# Patient Record
Sex: Female | Born: 1990 | Race: White | Hispanic: No | Marital: Married | Smoking: Never smoker
Health system: Southern US, Community
[De-identification: ages and names within clinical notes are randomized; demographics above are authoritative.]

## PROBLEM LIST (undated history)

## (undated) DIAGNOSIS — R200 Anesthesia of skin: Secondary | ICD-10-CM

## (undated) DIAGNOSIS — G35 Multiple sclerosis: Secondary | ICD-10-CM

## (undated) DIAGNOSIS — N83209 Unspecified ovarian cyst, unspecified side: Secondary | ICD-10-CM

## (undated) DIAGNOSIS — Z789 Other specified health status: Secondary | ICD-10-CM

## (undated) DIAGNOSIS — T7840XA Allergy, unspecified, initial encounter: Secondary | ICD-10-CM

## (undated) DIAGNOSIS — O1490 Unspecified pre-eclampsia, unspecified trimester: Secondary | ICD-10-CM

## (undated) HISTORY — DX: Allergy, unspecified, initial encounter: T78.40XA

## (undated) HISTORY — DX: Anesthesia of skin: R20.0

## (undated) HISTORY — PX: NO PAST SURGERIES: SHX2092

## (undated) HISTORY — PX: WISDOM TOOTH EXTRACTION: SHX21

## (undated) HISTORY — DX: Unspecified ovarian cyst, unspecified side: N83.209

---
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
## 2016-10-19 DIAGNOSIS — O1490 Unspecified pre-eclampsia, unspecified trimester: Secondary | ICD-10-CM

## 2016-10-19 HISTORY — DX: Unspecified pre-eclampsia, unspecified trimester: O14.90

## 2016-10-19 NOTE — L&D Delivery Note (Signed)
Delivery Note At 1731  a viable, healthy and but floppy female "Jessica Mcintosh"  was delivered via  (Presentation:OA ;  ) with shoulder dystocia-resolved after the follow maneuvers: Macroberts, super pubic pressure, attempted to deliver posterior shoulder, then corkscrew.  APGAR:7 ,9 ; weight 7#5oz  .   Placenta status: delivered intact with three vessel  Cord:  with the following complications: .shoulder cord x 1, shoulder dystocia  Anesthesia:  epidural Episiotomy:  none Lacerations:  right labial Suture Repair: 3.0 vicryl rapide Est. Blood Loss (mL):  480  Mom to postpartum.  Baby to Couplet care / Skin to Skin.  [name] 10/08/2017, 6:02 PM

## 2017-03-08 ENCOUNTER — Ambulatory Visit: Payer: Medicaid Other | Admitting: Certified Nurse Midwife

## 2017-03-08 VITALS — BP 113/66 | HR 80 | Ht 61.0 in | Wt 148.2 lb

## 2017-03-08 DIAGNOSIS — Z3401 Encounter for supervision of normal first pregnancy, first trimester: Secondary | ICD-10-CM

## 2017-03-08 NOTE — Progress Notes (Signed)
Lavoris Billow presents for NOB nurse interview visit. Pregnancy confirmation done ___5/14/18___.  G- 1.  P-0 . Pregnancy education material explained and given. __0_ cats in the home. NOB labs ordered. (TSH/HbgA1c due to Increased BMI). HIV labs and Drug screen were explained optional and she did not decline. Drug screen ordered. PNV encouraged. Genetic screening options discussed. Genetic testing: Ordered.  Pt may discuss with provider. Pt. To follow up with provider in _6_ weeks for NOB physical.  All questions answered. Pt has tendency to retain fluid.

## 2017-03-16 NOTE — Progress Notes (Signed)
I have reviewed the record and concur with patient management and plan.    Seidy Labreck Michelle Nima Bamburg, CNM [facility] 

## 2017-03-22 ENCOUNTER — Telehealth: Payer: Self-pay | Admitting: Certified Nurse Midwife

## 2017-03-22 ENCOUNTER — Other Ambulatory Visit: Payer: Self-pay | Admitting: *Deleted

## 2017-03-22 MED ORDER — ONDANSETRON HCL 4 MG PO TABS: 4.0000 mg | ORAL_TABLET | Freq: Three times a day (TID) | ORAL | 0 refills | Status: DC | PRN

## 2017-03-22 NOTE — Telephone Encounter (Signed)
Patient called wanting to get a refill of nausea medication, Patient is down to last pill and does not  have an appointment until 04/16/2017.Patient would like to speak with Marcelino Duster or a nurse  and get a solution to her nausea fairly soon. Please advise.

## 2017-03-22 NOTE — Telephone Encounter (Signed)
Done-ac 

## 2017-03-23 NOTE — Telephone Encounter (Signed)
Patient called again to check on refill - instructed patient to call her pharmacy

## 2017-04-02 ENCOUNTER — Other Ambulatory Visit: Payer: Self-pay | Admitting: Certified Nurse Midwife

## 2017-04-02 DIAGNOSIS — Z369 Encounter for antenatal screening, unspecified: Secondary | ICD-10-CM

## 2017-04-12 ENCOUNTER — Telehealth: Payer: Self-pay | Admitting: Certified Nurse Midwife

## 2017-04-12 MED ORDER — ONDANSETRON HCL 4 MG PO TABS: 4.0000 mg | ORAL_TABLET | Freq: Three times a day (TID) | ORAL | 0 refills | Status: DC | PRN

## 2017-04-12 NOTE — Telephone Encounter (Signed)
Patient needs refill of nausea medication  Pharm [facility] - [address] [place]

## 2017-04-16 ENCOUNTER — Ambulatory Visit (INDEPENDENT_AMBULATORY_CARE_PROVIDER_SITE_OTHER): Payer: Medicaid Other

## 2017-04-16 DIAGNOSIS — Z3401 Encounter for supervision of normal first pregnancy, first trimester: Secondary | ICD-10-CM | POA: Diagnosis not present

## 2017-04-16 DIAGNOSIS — Z369 Encounter for antenatal screening, unspecified: Secondary | ICD-10-CM | POA: Diagnosis not present

## 2017-04-16 LAB — OB RESULTS CONSOLE VARICELLA ZOSTER ANTIBODY, IGG: Varicella: IMMUNE

## 2017-04-17 LAB — CBC WITH DIFFERENTIAL
BASOS ABS: 0 10*3/uL (ref 0.0–0.2)
Basos: 0 %
EOS (ABSOLUTE): 0 10*3/uL (ref 0.0–0.4)
Eos: 0 %
Hematocrit: 41.1 % (ref 34.0–46.6)
Hemoglobin: 13.3 g/dL (ref 11.1–15.9)
IMMATURE GRANS (ABS): 0 10*3/uL (ref 0.0–0.1)
IMMATURE GRANULOCYTES: 0 %
LYMPHS: 12 %
Lymphocytes Absolute: 1.4 10*3/uL (ref 0.7–3.1)
MCH: 29.3 pg (ref 26.6–33.0)
MCHC: 32.4 g/dL (ref 31.5–35.7)
MCV: 91 fL (ref 79–97)
MONOS ABS: 0.7 10*3/uL (ref 0.1–0.9)
Monocytes: 6 %
NEUTROS PCT: 82 %
Neutrophils Absolute: 9.4 10*3/uL — ABNORMAL HIGH (ref 1.4–7.0)
RBC: 4.54 x10E6/uL (ref 3.77–5.28)
RDW: 13.1 % (ref 12.3–15.4)
WBC: 11.6 10*3/uL — AB (ref 3.4–10.8)

## 2017-04-17 LAB — HEPATITIS B SURFACE ANTIGEN: Hepatitis B Surface Ag: NEGATIVE

## 2017-04-17 LAB — RPR: RPR: NONREACTIVE

## 2017-04-17 LAB — HIV ANTIBODY (ROUTINE TESTING W REFLEX): HIV Screen 4th Generation wRfx: NONREACTIVE

## 2017-04-17 LAB — ABO AND RH: RH TYPE: POSITIVE

## 2017-04-17 LAB — VARICELLA ZOSTER ANTIBODY, IGG: Varicella zoster IgG: 2753 index (ref >165)

## 2017-04-17 LAB — RUBELLA SCREEN: RUBELLA: 3.7 {index} (ref >0.99)

## 2017-04-17 LAB — ANTIBODY SCREEN: ANTIBODY SCREEN: NEGATIVE

## 2017-04-18 LAB — URINE CULTURE

## 2017-04-19 ENCOUNTER — Encounter: Payer: Self-pay | Admitting: Certified Nurse Midwife

## 2017-04-19 ENCOUNTER — Ambulatory Visit (INDEPENDENT_AMBULATORY_CARE_PROVIDER_SITE_OTHER): Payer: Medicaid Other | Admitting: Certified Nurse Midwife

## 2017-04-19 VITALS — BP 81/54 | HR 74 | Wt 142.5 lb

## 2017-04-19 DIAGNOSIS — N898 Other specified noninflammatory disorders of vagina: Secondary | ICD-10-CM

## 2017-04-19 DIAGNOSIS — Z3401 Encounter for supervision of normal first pregnancy, first trimester: Secondary | ICD-10-CM

## 2017-04-19 DIAGNOSIS — K5900 Constipation, unspecified: Secondary | ICD-10-CM | POA: Insufficient documentation

## 2017-04-19 DIAGNOSIS — O99611 Diseases of the digestive system complicating pregnancy, first trimester: Secondary | ICD-10-CM

## 2017-04-19 LAB — MICROSCOPIC EXAMINATION: CASTS: NONE SEEN /LPF

## 2017-04-19 LAB — MONITOR DRUG PROFILE 14(MW)
AMPHETAMINE SCREEN URINE: NEGATIVE ng/mL
BARBITURATE SCREEN URINE: NEGATIVE ng/mL
BENZODIAZEPINE SCREEN, URINE: NEGATIVE ng/mL
BUPRENORPHINE, URINE: NEGATIVE ng/mL
CANNABINOIDS UR QL SCN: NEGATIVE ng/mL
Cocaine (Metab) Scrn, Ur: NEGATIVE ng/mL
Creatinine(Crt), U: 224.1 mg/dL (ref 20.0–300.0)
Fentanyl, Urine: NEGATIVE pg/mL
MEPERIDINE SCREEN, URINE: NEGATIVE ng/mL
Methadone Screen, Urine: NEGATIVE ng/mL
OXYCODONE+OXYMORPHONE UR QL SCN: NEGATIVE ng/mL
Opiate Scrn, Ur: NEGATIVE ng/mL
PHENCYCLIDINE QUANTITATIVE URINE: NEGATIVE ng/mL
PROPOXYPHENE SCREEN URINE: NEGATIVE ng/mL
Ph of Urine: 5.7 (ref 4.5–8.9)
SPECIFIC GRAVITY: 1.019
Tramadol Screen, Urine: NEGATIVE ng/mL

## 2017-04-19 LAB — URINALYSIS, ROUTINE W REFLEX MICROSCOPIC
BILIRUBIN UA: NEGATIVE
GLUCOSE, UA: NEGATIVE
NITRITE UA: NEGATIVE
Protein, UA: NEGATIVE
SPEC GRAV UA: 1.021 (ref 1.005–1.030)
UUROB: 0.2 mg/dL (ref 0.2–1.0)
pH, UA: 5.5 (ref 5.0–7.5)

## 2017-04-19 LAB — POCT URINALYSIS DIPSTICK
Bilirubin, UA: NEGATIVE
Glucose, UA: NEGATIVE
Ketones, UA: NEGATIVE
Leukocytes, UA: NEGATIVE
NITRITE UA: NEGATIVE
PH UA: 6 (ref 5.0–8.0)
RBC UA: NEGATIVE
UROBILINOGEN UA: 0.2 U/dL

## 2017-04-19 LAB — NICOTINE SCREEN, URINE: Cotinine Ql Scrn, Ur: NEGATIVE ng/mL

## 2017-04-19 MED ORDER — HYDROCORTISONE ACETATE 25 MG RE SUPP: 25.0000 mg | Freq: Two times a day (BID) | RECTAL | 0 refills | Status: DC

## 2017-04-19 MED ORDER — DOCUSATE SODIUM 100 MG PO CAPS: 100.0000 mg | ORAL_CAPSULE | Freq: Two times a day (BID) | ORAL | 2 refills | Status: DC | PRN

## 2017-04-19 MED ORDER — POLYETHYLENE GLYCOL 3350 17 GM/SCOOP PO POWD: 1.0000 | Freq: Once | ORAL | 0 refills | Status: AC

## 2017-04-19 NOTE — Progress Notes (Signed)
NOB- PE- Drug screen complete. Pos for vaginal odor all the time. Slight discharge.

## 2017-04-19 NOTE — Patient Instructions (Signed)
Second Trimester of Pregnancy The second trimester is from week 14 through week 27 (months 4 through 6). The second trimester is often a time when you feel your best. Your body has adjusted to being pregnant, and you begin to feel better physically. Usually, morning sickness has lessened or quit completely, you may have more energy, and you may have an increase in appetite. The second trimester is also a time when the fetus is growing rapidly. At the end of the sixth month, the fetus is about 9 inches long and weighs about 1 pounds. You will likely begin to feel the baby move (quickening) between 16 and 20 weeks of pregnancy. Body changes during your second trimester Your body continues to go through many changes during your second trimester. The changes vary from woman to woman.  Your weight will continue to increase. You will notice your lower abdomen bulging out.  You may begin to get stretch marks on your hips, abdomen, and breasts.  You may develop headaches that can be relieved by medicines. The medicines should be approved by your health care provider.  You may urinate more often because the fetus is pressing on your bladder.  You may develop or continue to have heartburn as a result of your pregnancy.  You may develop constipation because certain hormones are causing the muscles that push waste through your intestines to slow down.  You may develop hemorrhoids or swollen, bulging veins (varicose veins).  You may have back pain. This is caused by: ? Weight gain. ? Pregnancy hormones that are relaxing the joints in your pelvis. ? A shift in weight and the muscles that support your balance.  Your breasts will continue to grow and they will continue to become tender.  Your gums may bleed and may be sensitive to brushing and flossing.  Dark spots or blotches (chloasma, mask of pregnancy) may develop on your face. This will likely fade after the baby is born.  A dark line from your  belly button to the pubic area (linea nigra) may appear. This will likely fade after the baby is born.  You may have changes in your hair. These can include thickening of your hair, rapid growth, and changes in texture. Some women also have hair loss during or after pregnancy, or hair that feels dry or thin. Your hair will most likely return to normal after your baby is born.  What to expect at prenatal visits During a routine prenatal visit:  You will be weighed to make sure you and the fetus are growing normally.  Your blood pressure will be taken.  Your abdomen will be measured to track your baby's growth.  The fetal heartbeat will be listened to.  Any test results from the previous visit will be discussed.  Your health care provider may ask you:  How you are feeling.  If you are feeling the baby move.  If you have had any abnormal symptoms, such as leaking fluid, bleeding, severe headaches, or abdominal cramping.  If you are using any tobacco products, including cigarettes, chewing tobacco, and electronic cigarettes.  If you have any questions.  Other tests that may be performed during your second trimester include:  Blood tests that check for: ? Low iron levels (anemia). ? High blood sugar that affects pregnant women (gestational diabetes) between 82 and 28 weeks. ? Rh antibodies. This is to check for a protein on red blood cells (Rh factor).  Urine tests to check for infections, diabetes, or  protein in the urine.  An ultrasound to confirm the proper growth and development of the baby.  An amniocentesis to check for possible genetic problems.  Fetal screens for spina bifida and Down syndrome.  HIV (human immunodeficiency virus) testing. Routine prenatal testing includes screening for HIV, unless you choose not to have this test.  Follow these instructions at home: Medicines  Follow your health care provider's instructions regarding medicine use. Specific  medicines may be either safe or unsafe to take during pregnancy.  Take a prenatal vitamin that contains at least 600 micrograms (mcg) of folic acid.  If you develop constipation, try taking a stool softener if your health care provider approves. Eating and drinking  Eat a balanced diet that includes fresh fruits and vegetables, whole grains, good sources of protein such as meat, eggs, or tofu, and low-fat dairy. Your health care provider will help you determine the amount of weight gain that is right for you.  Avoid raw meat and uncooked cheese. These carry germs that can cause birth defects in the baby.  If you have low calcium intake from food, talk to your health care provider about whether you should take a daily calcium supplement.  Limit foods that are high in fat and processed sugars, such as fried and sweet foods.  To prevent constipation: ? Drink enough fluid to keep your urine clear or pale yellow. ? Eat foods that are high in fiber, such as fresh fruits and vegetables, whole grains, and beans. Activity  Exercise only as directed by your health care provider. Most women can continue their usual exercise routine during pregnancy. Try to exercise for 30 minutes at least 5 days a week. Stop exercising if you experience uterine contractions.  Avoid heavy lifting, wear low heel shoes, and practice good posture.  A sexual relationship may be continued unless your health care provider directs you otherwise. Relieving pain and discomfort  Wear a good support bra to prevent discomfort from breast tenderness.  Take warm sitz baths to soothe any pain or discomfort caused by hemorrhoids. Use hemorrhoid cream if your health care provider approves.  Rest with your legs elevated if you have leg cramps or low back pain.  If you develop varicose veins, wear support hose. Elevate your feet for 15 minutes, 3-4 times a day. Limit salt in your diet. Prenatal Care  Write down your questions.  Take them to your prenatal visits.  Keep all your prenatal visits as told by your health care provider. This is important. Safety  Wear your seat belt at all times when driving.  Make a list of emergency phone numbers, including numbers for family, friends, the hospital, and police and fire departments. General instructions  Ask your health care provider for a referral to a local prenatal education class. Begin classes no later than the beginning of month 6 of your pregnancy.  Ask for help if you have counseling or nutritional needs during pregnancy. Your health care provider can offer advice or refer you to specialists for help with various needs.  Do not use hot tubs, steam rooms, or saunas.  Do not douche or use tampons or scented sanitary pads.  Do not cross your legs for long periods of time.  Avoid cat litter boxes and soil used by cats. These carry germs that can cause birth defects in the baby and possibly loss of the fetus by miscarriage or stillbirth.  Avoid all smoking, herbs, alcohol, and unprescribed drugs. Chemicals in these products can  affect the formation and growth of the baby.  Do not use any products that contain nicotine or tobacco, such as cigarettes and e-cigarettes. If you need help quitting, ask your health care provider.  Visit your dentist if you have not gone yet during your pregnancy. Use a soft toothbrush to brush your teeth and be gentle when you floss. Contact a health care provider if:  You have dizziness.  You have mild pelvic cramps, pelvic pressure, or nagging pain in the abdominal area.  You have persistent nausea, vomiting, or diarrhea.  You have a bad smelling vaginal discharge.  You have pain when you urinate. Get help right away if:  You have a fever.  You are leaking fluid from your vagina.  You have spotting or bleeding from your vagina.  You have severe abdominal cramping or pain.  You have rapid weight gain or weight  loss.  You have shortness of breath with chest pain.  You notice sudden or extreme swelling of your face, hands, ankles, feet, or legs.  You have not felt your baby move in over an hour.  You have severe headaches that do not go away when you take medicine.  You have vision changes. Summary  The second trimester is from week 14 through week 27 (months 4 through 6). It is also a time when the fetus is growing rapidly.  Your body goes through many changes during pregnancy. The changes vary from woman to woman.  Avoid all smoking, herbs, alcohol, and unprescribed drugs. These chemicals affect the formation and growth your baby.  Do not use any tobacco products, such as cigarettes, chewing tobacco, and e-cigarettes. If you need help quitting, ask your health care provider.  Contact your health care provider if you have any questions. Keep all prenatal visits as told by your health care provider. This is important. This information is not intended to replace advice given to you by your health care provider. Make sure you discuss any questions you have with your health care provider. Document Released: 09/29/2001 Document Revised: 03/12/2016 Document Reviewed: 12/06/2012 Elsevier Interactive Patient Education  2017 [employer] WHAT OB PATIENTS CAN EXPECT   Confirmation of pregnancy and ultrasound ordered if medically indicated-[redacted] weeks gestation  New OB (NOB) intake with nurse and New OB (NOB) labs- [redacted] weeks gestation  New OB (NOB) physical examination with provider- 11/[redacted] weeks gestation  Flu vaccine-[redacted] weeks gestation  Anatomy scan-[redacted] weeks gestation  Glucose tolerance test, blood work to test for anemia, T-dap vaccine-[redacted] weeks gestation  Vaginal swabs/cultures-STD/Group B strep-[redacted] weeks gestation  Appointments every 4 weeks until 28 weeks  Every 2 weeks from 28 weeks until 36 weeks  Weekly visits from 36 weeks until delivery  Prenatal Care WHAT IS PRENATAL  CARE? Prenatal care is the process of caring for a pregnant woman before she gives birth. Prenatal care makes sure that she and her baby remain as healthy as possible throughout pregnancy. Prenatal care may be provided by a midwife, family practice health care provider, or a childbirth and pregnancy specialist (obstetrician). Prenatal care may include physical examinations, testing, treatments, and education on nutrition, lifestyle, and social support services. WHY IS PRENATAL CARE SO IMPORTANT? Early and consistent prenatal care increases the chance that you and your baby will remain healthy throughout your pregnancy. This type of care also decreases a baby's risk of being born too early (prematurely), or being born smaller than expected (small for gestational age). Any underlying medical conditions you may have that  could pose a risk during your pregnancy are discussed during prenatal care visits. You will also be monitored regularly for any new conditions that may arise during your pregnancy so they can be treated quickly and effectively. WHAT HAPPENS DURING PRENATAL CARE VISITS? Prenatal care visits may include the following: Discussion Tell your health care provider about any new signs or symptoms you have experienced since your last visit. These might include:  Nausea or vomiting.  Increased or decreased level of energy.  Difficulty sleeping.  Back or leg pain.  Weight changes.  Frequent urination.  Shortness of breath with physical activity.  Changes in your skin, such as the development of a rash or itchiness.  Vaginal discharge or bleeding.  Feelings of excitement or nervousness.  Changes in your baby's movements.  You may want to write down any questions or topics you want to discuss with your health care provider and bring them with you to your appointment. Examination During your first prenatal care visit, you will likely have a complete physical exam. Your health care  provider will often examine your vagina, cervix, and the position of your uterus, as well as check your heart, lungs, and other body systems. As your pregnancy progresses, your health care provider will measure the size of your uterus and your baby's position inside your uterus. He or she may also examine you for early signs of labor. Your prenatal visits may also include checking your blood pressure and, after about 10-12 weeks of pregnancy, listening to your baby's heartbeat. Testing Regular testing often includes:  Urinalysis. This checks your urine for glucose, protein, or signs of infection.  Blood count. This checks the levels of white and red blood cells in your body.  Tests for sexually transmitted infections (STIs). Testing for STIs at the beginning of pregnancy is routinely done and is required in many states.  Antibody testing. You will be checked to see if you are immune to certain illnesses, such as rubella, that can affect a developing fetus.  Glucose screen. Around 24-28 weeks of pregnancy, your blood glucose level will be checked for signs of gestational diabetes. Follow-up tests may be recommended.  Group B strep. This is a bacteria that is commonly found inside a woman's vagina. This test will inform your health care provider if you need an antibiotic to reduce the amount of this bacteria in your body prior to labor and childbirth.  Ultrasound. Many pregnant women undergo an ultrasound screening around 18-20 weeks of pregnancy to evaluate the health of the fetus and check for any developmental abnormalities.  HIV (human immunodeficiency virus) testing. Early in your pregnancy, you will be screened for HIV. If you are at high risk for HIV, this test may be repeated during your third trimester of pregnancy.  You may be offered other testing based on your age, personal or family medical history, or other factors. HOW OFTEN SHOULD I PLAN TO SEE MY HEALTH CARE PROVIDER FOR PRENATAL  CARE? Your prenatal care check-up schedule depends on any medical conditions you have before, or develop during, your pregnancy. If you do not have any underlying medical conditions, you will likely be seen for checkups:  Monthly, during the first 6 months of pregnancy.  Twice a month during months 7 and 8 of pregnancy.  Weekly starting in the 9th month of pregnancy and until delivery.  If you develop signs of early labor or other concerning signs or symptoms, you may need to see your health care provider more  often. Ask your health care provider what prenatal care schedule is best for you. WHAT CAN I DO TO KEEP MYSELF AND MY BABY AS HEALTHY AS POSSIBLE DURING MY PREGNANCY?  Take a prenatal vitamin containing 400 micrograms (0.4 mg) of folic acid every day. Your health care provider may also ask you to take additional vitamins such as iodine, vitamin D, iron, copper, and zinc.  Take 1500-2000 mg of calcium daily starting at your 20th week of pregnancy until you deliver your baby.  Make sure you are up to date on your vaccinations. Unless directed otherwise by your health care provider: ? You should receive a tetanus, diphtheria, and pertussis (Tdap) vaccination between the 27th and 36th week of your pregnancy, regardless of when your last Tdap immunization occurred. This helps protect your baby from whooping cough (pertussis) after he or she is born. ? You should receive an annual inactivated influenza vaccine (IIV) to help protect you and your baby from influenza. This can be done at any point during your pregnancy.  Eat a well-rounded diet that includes: ? Fresh fruits and vegetables. ? Lean proteins. ? Calcium-rich foods such as milk, yogurt, hard cheeses, and dark, leafy greens. ? Whole grain breads.  Do noteat seafood high in mercury, including: ? Swordfish. ? Tilefish. ? Shark. ? King mackerel. ? More than 6 oz tuna per week.  Do not eat: ? Raw or undercooked meats or  eggs. ? Unpasteurized foods, such as soft cheeses (brie, blue, or feta), juices, and milks. ? Lunch meats. ? Hot dogs that have not been heated until they are steaming.  Drink enough water to keep your urine clear or pale yellow. For many women, this may be 10 or more 8 oz glasses of water each day. Keeping yourself hydrated helps deliver nutrients to your baby and may prevent the start of pre-term uterine contractions.  Do not use any tobacco products including cigarettes, chewing tobacco, or electronic cigarettes. If you need help quitting, ask your health care provider.  Do not drink beverages containing alcohol. No safe level of alcohol consumption during pregnancy has been determined.  Do not use any illegal drugs. These can harm your developing baby or cause a miscarriage.  Ask your health care provider or pharmacist before taking any prescription or over-the-counter medicines, herbs, or supplements.  Limit your caffeine intake to no more than 200 mg per day.  Exercise. Unless told otherwise by your health care provider, try to get 30 minutes of moderate exercise most days of the week. Do not  do high-impact activities, contact sports, or activities with a high risk of falling, such as horseback riding or downhill skiing.  Get plenty of rest.  Avoid anything that raises your body temperature, such as hot tubs and saunas.  If you own a cat, do not empty its litter box. Bacteria contained in cat feces can cause an infection called toxoplasmosis. This can result in serious harm to the fetus.  Stay away from chemicals such as insecticides, lead, mercury, and cleaning or paint products that contain solvents.  Do not have any X-rays taken unless medically necessary.  Take a childbirth and breastfeeding preparation class. Ask your health care provider if you need a referral or recommendation.  This information is not intended to replace advice given to you by your health care provider.  Make sure you discuss any questions you have with your health care provider. Document Released: 10/08/2003 Document Revised: 03/09/2016 Document Reviewed: 12/20/2013 Elsevier Interactive Patient Education  2017 [employer]. Common Medications Safe in Pregnancy  Acne:      Constipation:  Benzoyl Peroxide     Colace  Clindamycin      Dulcolax Suppository  Topica Erythromycin     Fibercon  Salicylic Acid      Metamucil         Miralax AVOID:        Senakot   Accutane    Cough:  Retin-A       Cough Drops  Tetracycline      Phenergan w/ Codeine if Rx  Minocycline      Robitussin (Plain & DM)  Antibiotics:     Crabs/Lice:  Ceclor       RID  Cephalosporins    AVOID:  E-Mycins      Kwell  Keflex  Macrobid/Macrodantin   Diarrhea:  Penicillin      Kao-Pectate  Zithromax      Imodium AD         PUSH FLUIDS AVOID:       Cipro     Fever:  Tetracycline      Tylenol (Regular or Extra  Minocycline       Strength)  Levaquin      Extra Strength-Do not          Exceed 8 tabs/24 hrs Caffeine:        <250m/day (equiv. To 1 cup of coffee or  approx. 3 12 oz sodas)         Gas: Cold/Hayfever:       Gas-X  Benadryl      Mylicon  Claritin       Phazyme  **Claritin-D        Chlor-Trimeton    Headaches:  Dimetapp      ASA-Free Excedrin  Drixoral-Non-Drowsy     Cold Compress  Mucinex (Guaifenasin)     Tylenol (Regular or Extra  Sudafed/Sudafed-12 Hour     Strength)  **Sudafed PE Pseudoephedrine   Tylenol Cold & Sinus     Vicks Vapor Rub  Zyrtec  **AVOID if Problems With Blood Pressure         Heartburn: Avoid lying down for at least 1 hour after meals  Aciphex      Maalox     Rash:  Milk of Magnesia     Benadryl    Mylanta       1% Hydrocortisone Cream  Pepcid  Pepcid Complete   Sleep Aids:  Prevacid      Ambien   Prilosec       Benadryl  Rolaids       Chamomile Tea  Tums (Limit 4/day)     Unisom  Zantac       Tylenol PM         Warm milk-add vanilla  or  Hemorrhoids:       Sugar for taste  Anusol/Anusol H.C.  (RX: Analapram 2.5%)  Sugar Substitutes:  Hydrocortisone OTC     Ok in moderation  Preparation H      Tucks        Vaseline lotion applied to tissue with wiping    Herpes:     Throat:  Acyclovir      Oragel  Famvir  Valtrex     Vaccines:         Flu Shot Leg Cramps:       *Gardasil  Benadryl      Hepatitis A         Hepatitis  B Nasal Spray:       Pneumovax  Saline Nasal Spray     Polio Booster         Tetanus Nausea:       Tuberculosis test or PPD  Vitamin B6 25 mg TID   AVOID:    Dramamine      *Gardasil  Emetrol       Live Poliovirus  Ginger Root 250 mg QID    MMR (measles, mumps &  High Complex Carbs @ Bedtime    rebella)  Sea Bands-Accupressure    Varicella (Chickenpox)  Unisom 1/2 tab TID     *No known complications           If received before Pain:         Known pregnancy;   Darvocet       Resume series after  Lortab        Delivery  Percocet    Yeast:   Tramadol      Femstat  Tylenol 3      Gyne-lotrimin  Ultram       Monistat  Vicodin           MISC:         All Sunscreens           Hair Coloring/highlights          Insect Repellant's          (Including DEET)         Mystic Tans

## 2017-04-19 NOTE — Progress Notes (Addendum)
NEW OB HISTORY AND PHYSICAL  SUBJECTIVE:       Jessica Mcintosh is a 26 y.o. G1P0 female, Patient's last menstrual period was 01/21/2017 (exact date)., Estimated Date of Delivery: 10/28/17, [redacted]w[redacted]d, verified by first trimester ultrasound, presents today for establishment of Prenatal Care.  She reports breast tenderness, intermittent lower abdominal cramping, increased malodorous vaginal discharge, and significant constipation. She endorses two (2) bowel movements in the last five (5) weeks.   Denies difficulty breathing or respiratory distress, chest pain, abdominal pain, dysuria, vaginal bleeding, and leg pain or swelling.    Gynecologic History  Patient's last menstrual period was 01/21/2017 (exact date).  Contraception: none   Last Pap: N/A.   Obstetric History  OB History  Gravida Para Term Preterm AB Living  1            SAB TAB Ectopic Multiple Live Births               # Outcome Date GA Lbr Len/2nd Weight Sex Delivery Anes PTL Lv  1 Current               Past Medical History:  Diagnosis Date  . Ovarian cyst     No past surgical history on file.  Current Outpatient Prescriptions on File Prior to Visit  Medication Sig Dispense Refill  . ondansetron (ZOFRAN) 4 MG tablet Take 1 tablet (4 mg total) by mouth every 8 (eight) hours as needed for nausea or vomiting. 20 tablet 0  . Prenatal Vit-Fe Fumarate-FA (PRENATAL MULTIVITAMIN) TABS tablet Take 1 tablet by mouth daily at 12 noon.     No current facility-administered medications on file prior to visit.     Allergies  Allergen Reactions  . Aspirin Hives, Itching, Rash, Shortness Of Breath and Swelling  . Penicillins     Social History   Social History  . Marital status: Single    Spouse name: N/A  . Number of children: N/A  . Years of education: N/A   Occupational History  . Not on file.   Social History Main Topics  . Smoking status: Never Smoker  . Smokeless tobacco: Never Used  . Alcohol use No  . Drug  use: No  . Sexual activity: Yes    Birth control/ protection: None   Other Topics Concern  . Not on file   Social History Narrative  . No narrative on file    Family History  Problem Relation Age of Onset  . Depression Mother   . Hypertension Mother   . Depression Maternal Aunt   . Hypertension Maternal Aunt   . Cancer Paternal Uncle   . Lung cancer Paternal Uncle   . Breast cancer Maternal Grandmother   . Depression Maternal Grandmother   . Cancer Maternal Grandfather   . Lung cancer Maternal Grandfather   . Cancer Paternal Grandfather   . Skin cancer Paternal Grandfather     The following portions of the patient's history were reviewed and updated as appropriate: allergies, current medications, past OB history, past medical history, past surgical history, past family history, past social history, and problem list.   Review of Systems:  Review of systems negative except as noted above. Information obtained from patient.   OBJECTIVE:  Initial Physical Exam (New OB)  GENERAL APPEARANCE: alert, well appearing, in no apparent distress  HEAD: normocephalic, atraumatic  MOUTH: mucous membranes moist, pharynx normal without lesions and dental hygiene good  THYROID: no thyromegaly or masses present  BREASTS: no masses noted,  no significant tenderness, no palpable axillary nodes, no skin changes  LUNGS: clear to auscultation, no wheezes, rales or rhonchi, symmetric air entry  HEART: regular rate and rhythm, no murmurs  ABDOMEN: soft, nontender, nondistended, no abnormal masses, no epigastric pain, fundus not palpable and FHT present  EXTREMITIES: no redness or tenderness in the calves or thighs, no edema  SKIN: normal coloration and turgor, no rashes  LYMPH NODES: no adenopathy palpable  NEUROLOGIC: alert, oriented, normal speech, no focal findings or movement disorder noted  PELVIC EXAM EXTERNAL GENITALIA: normal appearing vulva with no masses, tenderness or  lesions VAGINA: no lesions, copious white discharge present CERVIX: no lesions or cervical motion tenderness, Pap collected UTERUS: gravid and consistent with 12 weeks ADNEXA: no masses palpable and nontender OB EXAM PELVIMETRY: appears adequate  ASSESSMENT:  Normal pregnancy  Constipation during pregnancy, first trimester  Desires genetic screening  Screening for cervical cancer  PLAN:  Prenatal care  Labs: Pap and NuSwab collected, will contact patient via MyChart with results.   The patient has been given an overview regarding routine prenatal care.  Recommendations regarding diet, weight gain, and exercise in pregnancy were given.  Prenatal testing, optional genetic testing, and ultrasound use in pregnancy were reviewed. MaterniT 21 Plus today, see orders.   Discussed pregnancy safe medications and home treatment measures.   Advised Anusol, Colace, Benefiber, and Miralax for treatment of pregnancy associated constipation.   Benefits of Breast Feeding were discussed. The patient is encouraged to consider nursing her baby post partum.  Reviewed red flag symptoms and when to call.   RTC x 4 weeks for ROB or sooner if needed.  See orders   Gunnar Bulla, CNM

## 2017-04-22 LAB — PAP IG, CT-NG, RFX HPV ASCU
Chlamydia, Nuc. Acid Amp: NEGATIVE
Gonococcus by Nucleic Acid Amp: NEGATIVE
PAP SMEAR COMMENT: 0

## 2017-04-23 LAB — NUSWAB BV AND CANDIDA, NAA
Atopobium vaginae: HIGH Score — AB
BVAB 2: HIGH {score} — AB
CANDIDA GLABRATA, NAA: NEGATIVE
Candida albicans, NAA: NEGATIVE
Megasphaera 1: HIGH Score — AB

## 2017-04-24 LAB — MATERNIT 21 PLUS CORE, BLOOD
Chromosome 13: NEGATIVE
Chromosome 18: NEGATIVE
Chromosome 21: NEGATIVE
Y Chromosome: DETECTED

## 2017-04-27 ENCOUNTER — Other Ambulatory Visit: Payer: Self-pay | Admitting: Certified Nurse Midwife

## 2017-04-27 ENCOUNTER — Telehealth: Payer: Self-pay | Admitting: Certified Nurse Midwife

## 2017-04-27 MED ORDER — METRONIDAZOLE 500 MG PO TABS: 500.0000 mg | ORAL_TABLET | Freq: Two times a day (BID) | ORAL | 0 refills | Status: AC

## 2017-04-27 NOTE — Telephone Encounter (Signed)
Telephone call to patient, verified full name and date of birth.   Results reviewed NuSwab-positive BV-and Maternit21-low risk, female. Rx: Flagyl 500 mg PO BID x 7 days, see order. Pt verbalized understanding.   Call back with further needs, questions or concerns.   Serafina Royals, CNM

## 2017-05-03 ENCOUNTER — Telehealth: Payer: Self-pay | Admitting: Certified Nurse Midwife

## 2017-05-03 DIAGNOSIS — O219 Vomiting of pregnancy, unspecified: Secondary | ICD-10-CM

## 2017-05-03 MED ORDER — ONDANSETRON HCL 4 MG PO TABS: 4.0000 mg | ORAL_TABLET | Freq: Three times a day (TID) | ORAL | 3 refills | Status: DC | PRN

## 2017-05-03 NOTE — Telephone Encounter (Signed)
RX sent

## 2017-05-03 NOTE — Telephone Encounter (Signed)
Patient lvm to inform Pattricia Boss that the patient would like a refill on her nausea medication  ondansetron (ZOFRAN) 4 MG tablet. Patient did not disclose any other information. Please advise.

## 2017-05-17 ENCOUNTER — Encounter: Payer: Self-pay | Admitting: Certified Nurse Midwife

## 2017-05-17 ENCOUNTER — Ambulatory Visit (INDEPENDENT_AMBULATORY_CARE_PROVIDER_SITE_OTHER): Payer: Medicaid Other | Admitting: Certified Nurse Midwife

## 2017-05-17 VITALS — BP 117/69 | HR 84 | Wt 143.6 lb

## 2017-05-17 DIAGNOSIS — Z3492 Encounter for supervision of normal pregnancy, unspecified, second trimester: Secondary | ICD-10-CM

## 2017-05-17 LAB — POCT URINALYSIS DIPSTICK
BILIRUBIN UA: NEGATIVE
GLUCOSE UA: NEGATIVE
Ketones, UA: NEGATIVE
LEUKOCYTES UA: NEGATIVE
NITRITE UA: NEGATIVE
Protein, UA: NEGATIVE
RBC UA: NEGATIVE
Spec Grav, UA: 1.02 (ref 1.010–1.025)
Urobilinogen, UA: 0.2 E.U./dL
pH, UA: 5 (ref 5.0–8.0)

## 2017-05-17 NOTE — Patient Instructions (Signed)
Round Ligament Pain The round ligament is a cord of muscle and tissue that helps to support the uterus. It can become a source of pain during pregnancy if it becomes stretched or twisted as the baby grows. The pain usually begins in the second trimester of pregnancy, and it can come and go until the baby is delivered. It is not a serious problem, and it does not cause harm to the baby. Round ligament pain is usually a short, sharp, and pinching pain, but it can also be a dull, lingering, and aching pain. The pain is felt in the lower side of the abdomen or in the groin. It usually starts deep in the groin and moves up to the outside of the hip area. Pain can occur with:  A sudden change in position.  Rolling over in bed.  Coughing or sneezing.  Physical activity.  Follow these instructions at home: Watch your condition for any changes. Take these steps to help with your pain:  When the pain starts, relax. Then try: ? Sitting down. ? Flexing your knees up to your abdomen. ? Lying on your side with one pillow under your abdomen and another pillow between your legs. ? Sitting in a warm bath for 15-20 minutes or until the pain goes away.  Take over-the-counter and prescription medicines only as told by your health care provider.  Move slowly when you sit and stand.  Avoid long walks if they cause pain.  Stop or lessen your physical activities if they cause pain.  Contact a health care provider if:  Your pain does not go away with treatment.  You feel pain in your back that you did not have before.  Your medicine is not helping. Get help right away if:  You develop a fever or chills.  You develop uterine contractions.  You develop vaginal bleeding.  You develop nausea or vomiting.  You develop diarrhea.  You have pain when you urinate. This information is not intended to replace advice given to you by your health care provider. Make sure you discuss any questions you have  with your health care provider. Document Released: 07/14/2008 Document Revised: 03/12/2016 Document Reviewed: 12/12/2014 Elsevier Interactive Patient Education  2018 [employer] How a Baby Grows During Pregnancy Pregnancy begins when a female's sperm enters a female's egg (fertilization). This happens in one of the tubes (fallopian tubes) that connect the ovaries to the womb (uterus). The fertilized egg is called an embryo until it reaches 10 weeks. From 10 weeks until birth, it is called a fetus. The fertilized egg moves down the fallopian tube to the uterus. Then it implants into the lining of the uterus and begins to grow. The developing fetus receives oxygen and nutrients through the pregnant woman's bloodstream and the tissues that grow (placenta) to support the fetus. The placenta is the life support system for the fetus. It provides nutrition and removes waste. Learning as much as you can about your pregnancy and how your baby is developing can help you enjoy the experience. It can also make you aware of when there might be a problem and when to ask questions. How long does a typical pregnancy last? A pregnancy usually lasts 280 days, or about 40 weeks. Pregnancy is divided into three trimesters:  First trimester: 0-13 weeks.  Second trimester: 14-27 weeks.  Third trimester: 28-40 weeks.  The day when your baby is considered ready to be born (full term) is your estimated date of delivery. How does   my baby develop month by month? First month  The fertilized egg attaches to the inside of the uterus.  Some cells will form the placenta. Others will form the fetus.  The arms, legs, brain, spinal cord, lungs, and heart begin to develop.  At the end of the first month, the heart begins to beat.  Second month  The bones, inner ear, eyelids, hands, and feet form.  The genitals develop.  By the end of 8 weeks, all major organs are developing.  Third month  All of the internal  organs are forming.  Teeth develop below the gums.  Bones and muscles begin to grow. The spine can flex.  The skin is transparent.  Fingernails and toenails begin to form.  Arms and legs continue to grow longer, and hands and feet develop.  The fetus is about 3 in (7.6 cm) long.  Fourth month  The placenta is completely formed.  The external sex organs, neck, outer ear, eyebrows, eyelids, and fingernails are formed.  The fetus can hear, swallow, and move its arms and legs.  The kidneys begin to produce urine.  The skin is covered with a white waxy coating (vernix) and very fine hair (lanugo).  Fifth month  The fetus moves around more and can be felt for the first time (quickening).  The fetus starts to sleep and wake up and may begin to suck its finger.  The nails grow to the end of the fingers.  The organ in the digestive system that makes bile (gallbladder) functions and helps to digest the nutrients.  If your baby is a girl, eggs are present in her ovaries. If your baby is a boy, testicles start to move down into his scrotum.  Sixth month  The lungs are formed, but the fetus is not yet able to breathe.  The eyes open. The brain continues to develop.  Your baby has fingerprints and toe prints. Your baby's hair grows thicker.  At the end of the second trimester, the fetus is about 9 in (22.9 cm) long.  Seventh month  The fetus kicks and stretches.  The eyes are developed enough to sense changes in light.  The hands can make a grasping motion.  The fetus responds to sound.  Eighth month  All organs and body systems are fully developed and functioning.  Bones harden and taste buds develop. The fetus may hiccup.  Certain areas of the brain are still developing. The skull remains soft.  Ninth month  The fetus gains about  lb (0.23 kg) each week.  The lungs are fully developed.  Patterns of sleep develop.  The fetus's head typically moves into a  head-down position (vertex) in the uterus to prepare for birth. If the buttocks move into a vertex position instead, the baby is breech.  The fetus weighs 6-9 lbs (2.72-4.08 kg) and is 19-20 in (48.26-50.8 cm) long.  What can I do to have a healthy pregnancy and help my baby develop? Eating and Drinking  Eat a healthy diet. ? Talk with your health care provider to make sure that you are getting the nutrients that you and your baby need. ? Visit www.choosemyplate.gov to learn about creating a healthy diet.  Gain a healthy amount of weight during pregnancy as advised by your health care provider. This is usually 25-35 pounds. You may need to: ? Gain more if you were underweight before getting pregnant or if you are pregnant with more than one baby. ? Gain less   if you were overweight or obese when you got pregnant.  Medicines and Vitamins  Take prenatal vitamins as directed by your health care provider. These include vitamins such as folic acid, iron, calcium, and vitamin D. They are important for healthy development.  Take medicines only as directed by your health care provider. Read labels and ask a pharmacist or your health care provider whether over-the-counter medicines, supplements, and prescription drugs are safe to take during pregnancy.  Activities  Be physically active as advised by your health care provider. Ask your health care provider to recommend activities that are safe for you to do, such as walking or swimming.  Do not participate in strenuous or extreme sports.  Lifestyle  Do not drink alcohol.  Do not use any tobacco products, including cigarettes, chewing tobacco, or electronic cigarettes. If you need help quitting, ask your health care provider.  Do not use illegal drugs.  Safety  Avoid exposure to mercury, lead, or other heavy metals. Ask your health care provider about common sources of these heavy metals.  Avoid listeria infection during pregnancy. Follow  these precautions: ? Do not eat soft cheeses or deli meats. ? Do not eat hot dogs unless they have been warmed up to the point of steaming, such as in the microwave oven. ? Do not drink unpasteurized milk.  Avoid toxoplasmosis infection during pregnancy. Follow these precautions: ? Do not change your cat's litter box, if you have a cat. Ask someone else to do this for you. ? Wear gardening gloves while working in the yard.  General Instructions  Keep all follow-up visits as directed by your health care provider. This is important. This includes prenatal care and screening tests.  Manage any chronic health conditions. Work closely with your health care provider to keep conditions, such as diabetes, under control.  How do I know if my baby is developing well? At each prenatal visit, your health care provider will do several different tests to check on your health and keep track of your baby's development. These include:  Fundal height. ? Your health care provider will measure your growing belly from top to bottom using a tape measure. ? Your health care provider will also feel your belly to determine your baby's position.  Heartbeat. ? An ultrasound in the first trimester can confirm pregnancy and show a heartbeat, depending on how far along you are. ? Your health care provider will check your baby's heart rate at every prenatal visit. ? As you get closer to your delivery date, you may have regular fetal heart rate monitoring to make sure that your baby is not in distress.  Second trimester ultrasound. ? This ultrasound checks your baby's development. It also indicates your baby's gender.  What should I do if I have concerns about my baby's development? Always talk with your health care provider about any concerns that you may have. This information is not intended to replace advice given to you by your health care provider. Make sure you discuss any questions you have with your health  care provider. Document Released: 03/23/2008 Document Revised: 03/12/2016 Document Reviewed: 03/14/2014 Elsevier Interactive Patient Education  2018 [employer]  

## 2017-05-17 NOTE — Progress Notes (Signed)
Pt is here for ROB. 

## 2017-05-17 NOTE — Progress Notes (Signed)
ROB, doing well. No complaints. Thinks she is starting to feel baby move. Discussed anatomy scan at next appointment. She verbalizes understanding. Follow up in 4 wks anatomy scan and ROB.   Doreene Burke, CNM

## 2017-06-11 ENCOUNTER — Other Ambulatory Visit: Payer: Self-pay | Admitting: Certified Nurse Midwife

## 2017-06-11 DIAGNOSIS — Z369 Encounter for antenatal screening, unspecified: Secondary | ICD-10-CM

## 2017-06-18 ENCOUNTER — Other Ambulatory Visit: Payer: Medicaid Other

## 2017-06-18 ENCOUNTER — Ambulatory Visit (INDEPENDENT_AMBULATORY_CARE_PROVIDER_SITE_OTHER): Payer: Medicaid Other | Admitting: Certified Nurse Midwife

## 2017-06-18 ENCOUNTER — Ambulatory Visit (INDEPENDENT_AMBULATORY_CARE_PROVIDER_SITE_OTHER): Payer: Medicaid Other

## 2017-06-18 ENCOUNTER — Encounter: Payer: Medicaid Other | Admitting: Obstetrics and Gynecology

## 2017-06-18 VITALS — BP 118/67 | HR 87 | Wt 146.6 lb

## 2017-06-18 DIAGNOSIS — Z3402 Encounter for supervision of normal first pregnancy, second trimester: Secondary | ICD-10-CM

## 2017-06-18 DIAGNOSIS — Z3401 Encounter for supervision of normal first pregnancy, first trimester: Secondary | ICD-10-CM | POA: Diagnosis not present

## 2017-06-18 DIAGNOSIS — Z369 Encounter for antenatal screening, unspecified: Secondary | ICD-10-CM | POA: Diagnosis not present

## 2017-06-18 MED ORDER — SERTRALINE HCL 50 MG PO TABS: 50.0000 mg | ORAL_TABLET | Freq: Every day | ORAL | 0 refills | Status: DC

## 2017-06-18 NOTE — Progress Notes (Signed)
ROB-Reports increased fatigue, anxiety, and depression. MIL was arrested due to probation violation, GF passed away, and GM requires daily care. PHQ-9: 18. Encouraged medication or counseling. Rx: Zoloft, see orders. Discussed home treatment measures for hip pain including use of abdominal support. Reviewed red flag symptoms and when to call. RTC x 2 weeks for medication check, if starts Zoloft. RTC x 4 weeks for ROB or sooner if needed.   ULTRASOUND REPORT  Location: [facility] Date of Service: 06/18/17  Indications: Anatomy Findings:  Singleton intrauterine pregnancy is visualized with FHR at 155 BPM. Biometrics give an (U/S) Gestational age of [redacted] weeks 3 days, and an (U/S) EDD of 10/26/17; this correlates with the clinically established EDD of 10/28/17.  Fetal presentation is vertex, spine anterior.  EFW: 435 grams ( 0 lbs. 15 oz.). Placenta: Posterior, grade 1 and remote to cervix at 3.8 cm AFI: Subjectively adequate with an MVP of 5.1 cm.  Anatomic survey is complete and appears WNL. Gender - Female.   Right Ovary measures 2.7 x 2.5 x 1.9 cm, and appears WNL. Left Ovary measures 2.6 x 1.9 x 2.1 cm, and appears WNL. There is no evidence of a corpus luteal cyst. Survey of the adnexa demonstrates no adnexal masses. There is no free peritoneal fluid in the cul de sac.  Impression: 1. 21 week 3 day Viable Singleton Intrauterine pregnancy by U/S. 2. (U/S) EDD is consistent with Clinically established (LMP) EDD of 10/28/17. 3. Normal appearing anatomy scan.  Recommendations: 1.Clinical correlation with the patient's History and Physical Exam.  Depression screen [facility] 2/9 06/18/2017  Decreased Interest 3  Down, Depressed, Hopeless 2  PHQ - 2 Score 5  Altered sleeping 1  Tired, decreased energy 3  Change in appetite 1  Feeling bad or failure about yourself  2  Trouble concentrating 3  Moving slowly or fidgety/restless 3  Suicidal thoughts 0  PHQ-9 Score 18  Difficult doing  work/chores Somewhat difficult

## 2017-06-18 NOTE — Patient Instructions (Addendum)
Sertraline tablets What is this medicine? SERTRALINE (SER tra leen) is used to treat depression. It may also be used to treat obsessive compulsive disorder, panic disorder, post-trauma stress, premenstrual dysphoric disorder (PMDD) or social anxiety. This medicine may be used for other purposes; ask your health care provider or pharmacist if you have questions. COMMON BRAND NAME(S): Zoloft What should I tell my health care provider before I take this medicine? They need to know if you have any of these conditions: -bleeding disorders -bipolar disorder or a family history of bipolar disorder -glaucoma -heart disease -high blood pressure -history of irregular heartbeat -history of low levels of calcium, magnesium, or potassium in the blood -if you often drink alcohol -liver disease -receiving electroconvulsive therapy -seizures -suicidal thoughts, plans, or attempt; a previous suicide attempt by you or a family member -take medicines that treat or prevent blood clots -thyroid disease -an unusual or allergic reaction to sertraline, other medicines, foods, dyes, or preservatives -pregnant or trying to get pregnant -breast-feeding How should I use this medicine? Take this medicine by mouth with a glass of water. Follow the directions on the prescription label. You can take it with or without food. Take your medicine at regular intervals. Do not take your medicine more often than directed. Do not stop taking this medicine suddenly except upon the advice of your doctor. Stopping this medicine too quickly may cause serious side effects or your condition may worsen. A special MedGuide will be given to you by the pharmacist with each prescription and refill. Be sure to read this information carefully each time. Talk to your pediatrician regarding the use of this medicine in children. While this drug may be prescribed for children as young as 7 years for selected conditions, precautions do  apply. Overdosage: If you think you have taken too much of this medicine contact a poison control center or emergency room at once. NOTE: This medicine is only for you. Do not share this medicine with others. What if I miss a dose? If you miss a dose, take it as soon as you can. If it is almost time for your next dose, take only that dose. Do not take double or extra doses. What may interact with this medicine? Do not take this medicine with any of the following medications: -cisapride -dofetilide -dronedarone -linezolid -MAOIs like Carbex, Eldepryl, Marplan, Nardil, and Parnate -methylene blue (injected into a vein) -pimozide -thioridazine This medicine may also interact with the following medications: -alcohol -amphetamines -aspirin and aspirin-like medicines -certain medicines for depression, anxiety, or psychotic disturbances -certain medicines for fungal infections like ketoconazole, fluconazole, posaconazole, and itraconazole -certain medicines for irregular heart beat like flecainide, quinidine, propafenone -certain medicines for migraine headaches like almotriptan, eletriptan, frovatriptan, naratriptan, rizatriptan, sumatriptan, zolmitriptan -certain medicines for sleep -certain medicines for seizures like carbamazepine, valproic acid, phenytoin -certain medicines that treat or prevent blood clots like warfarin, enoxaparin, dalteparin -cimetidine -digoxin -diuretics -fentanyl -isoniazid -lithium -NSAIDs, medicines for pain and inflammation, like ibuprofen or naproxen -other medicines that prolong the QT interval (cause an abnormal heart rhythm) -rasagiline -safinamide -supplements like St. John's wort, kava kava, valerian -tolbutamide -tramadol -tryptophan This list may not describe all possible interactions. Give your health care provider a list of all the medicines, herbs, non-prescription drugs, or dietary supplements you use. Also tell them if you smoke, drink  alcohol, or use illegal drugs. Some items may interact with your medicine. What should I watch for while using this medicine? Tell your doctor if your symptoms  do not get better or if they get worse. Visit your doctor or health care professional for regular checks on your progress. Because it may take several weeks to see the full effects of this medicine, it is important to continue your treatment as prescribed by your doctor. Patients and their families should watch out for new or worsening thoughts of suicide or depression. Also watch out for sudden changes in feelings such as feeling anxious, agitated, panicky, irritable, hostile, aggressive, impulsive, severely restless, overly excited and hyperactive, or not being able to sleep. If this happens, especially at the beginning of treatment or after a change in dose, call your health care professional. Dennis Bast may get drowsy or dizzy. Do not drive, use machinery, or do anything that needs mental alertness until you know how this medicine affects you. Do not stand or sit up quickly, especially if you are an older patient. This reduces the risk of dizzy or fainting spells. Alcohol may interfere with the effect of this medicine. Avoid alcoholic drinks. Your mouth may get dry. Chewing sugarless gum or sucking hard candy, and drinking plenty of water may help. Contact your doctor if the problem does not go away or is severe. What side effects may I notice from receiving this medicine? Side effects that you should report to your doctor or health care professional as soon as possible: -allergic reactions like skin rash, itching or hives, swelling of the face, lips, or tongue -anxious -black, tarry stools -changes in vision -confusion -elevated mood, decreased need for sleep, racing thoughts, impulsive behavior -eye pain -fast, irregular heartbeat -feeling faint or lightheaded, falls -feeling agitated, angry, or irritable -hallucination, loss of contact with  reality -loss of balance or coordination -loss of memory -painful or prolonged erections -restlessness, pacing, inability to keep still -seizures -stiff muscles -suicidal thoughts or other mood changes -trouble sleeping -unusual bleeding or bruising -unusually weak or tired -vomiting Side effects that usually do not require medical attention (report to your doctor or health care professional if they continue or are bothersome): -change in appetite or weight -change in sex drive or performance -diarrhea -increased sweating -indigestion, nausea -tremors This list may not describe all possible side effects. Call your doctor for medical advice about side effects. You may report side effects to FDA at 1-800-FDA-1088. Where should I keep my medicine? Keep out of the reach of children. Store at room temperature between 15 and 30 degrees C (59 and 86 degrees F). Throw away any unused medicine after the expiration date. NOTE: This sheet is a summary. It may not cover all possible information. If you have questions about this medicine, talk to your doctor, pharmacist, or health care provider.  2018 Elsevier/Gold Standard (2016-10-09 14:17:49) Round Ligament Pain The round ligament is a cord of muscle and tissue that helps to support the uterus. It can become a source of pain during pregnancy if it becomes stretched or twisted as the baby grows. The pain usually begins in the second trimester of pregnancy, and it can come and go until the baby is delivered. It is not a serious problem, and it does not cause harm to the baby. Round ligament pain is usually a short, sharp, and pinching pain, but it can also be a dull, lingering, and aching pain. The pain is felt in the lower side of the abdomen or in the groin. It usually starts deep in the groin and moves up to the outside of the hip area. Pain can occur with:  A  sudden change in position.  Rolling over in bed.  Coughing or  sneezing.  Physical activity.  Follow these instructions at home: Watch your condition for any changes. Take these steps to help with your pain:  When the pain starts, relax. Then try: ? Sitting down. ? Flexing your knees up to your abdomen. ? Lying on your side with one pillow under your abdomen and another pillow between your legs. ? Sitting in a warm bath for 15-20 minutes or until the pain goes away.  Take over-the-counter and prescription medicines only as told by your health care provider.  Move slowly when you sit and stand.  Avoid long walks if they cause pain.  Stop or lessen your physical activities if they cause pain.  Contact a health care provider if:  Your pain does not go away with treatment.  You feel pain in your back that you did not have before.  Your medicine is not helping. Get help right away if:  You develop a fever or chills.  You develop uterine contractions.  You develop vaginal bleeding.  You develop nausea or vomiting.  You develop diarrhea.  You have pain when you urinate. This information is not intended to replace advice given to you by your health care provider. Make sure you discuss any questions you have with your health care provider. Document Released: 07/14/2008 Document Revised: 03/12/2016 Document Reviewed: 12/12/2014 Elsevier Interactive Patient Education  2018 [employer]. Common Medications Safe in Pregnancy  Acne:      Constipation:  Benzoyl Peroxide     Colace  Clindamycin      Dulcolax Suppository  Topica Erythromycin     Fibercon  Salicylic Acid      Metamucil         Miralax AVOID:        Senakot   Accutane    Cough:  Retin-A       Cough Drops  Tetracycline      Phenergan w/ Codeine if Rx  Minocycline      Robitussin (Plain &  DM)  Antibiotics:     Crabs/Lice:  Ceclor       RID  Cephalosporins    AVOID:  E-Mycins      Kwell  Keflex  Macrobid/Macrodantin   Diarrhea:  Penicillin      Kao-Pectate  Zithromax      Imodium AD         PUSH FLUIDS AVOID:       Cipro     Fever:  Tetracycline      Tylenol (Regular or Extra  Minocycline       Strength)  Levaquin      Extra Strength-Do not          Exceed 8 tabs/24 hrs Caffeine:        <221m/day (equiv. To 1 cup of coffee or  approx. 3 12 oz sodas)         Gas: Cold/Hayfever:       Gas-X  Benadryl      Mylicon  Claritin       Phazyme  **Claritin-D        Chlor-Trimeton    Headaches:  Dimetapp      ASA-Free Excedrin  Drixoral-Non-Drowsy     Cold Compress  Mucinex (Guaifenasin)     Tylenol (Regular or Extra  Sudafed/Sudafed-12 Hour     Strength)  **Sudafed PE Pseudoephedrine   Tylenol Cold & Sinus     Vicks Vapor Rub  Zyrtec  **  AVOID if Problems With Blood Pressure         Heartburn: Avoid lying down for at least 1 hour after meals  Aciphex      Maalox     Rash:  Milk of Magnesia     Benadryl    Mylanta       1% Hydrocortisone Cream  Pepcid  Pepcid Complete   Sleep Aids:  Prevacid      Ambien   Prilosec       Benadryl  Rolaids       Chamomile Tea  Tums (Limit 4/day)     Unisom  Zantac       Tylenol PM         Warm milk-add vanilla or  Hemorrhoids:       Sugar for taste  Anusol/Anusol H.C.  (RX: Analapram 2.5%)  Sugar Substitutes:  Hydrocortisone OTC     Ok in moderation  Preparation H      Tucks        Vaseline lotion applied to tissue with wiping    Herpes:     Throat:  Acyclovir      Oragel  Famvir  Valtrex     Vaccines:         Flu Shot Leg Cramps:       *Gardasil  Benadryl      Hepatitis A         Hepatitis B Nasal Spray:       Pneumovax  Saline Nasal Spray     Polio Booster         Tetanus Nausea:       Tuberculosis test or PPD  Vitamin B6 25 mg TID   AVOID:    Dramamine      *Gardasil  Emetrol       Live  Poliovirus  Ginger Root 250 mg QID    MMR (measles, mumps &  High Complex Carbs @ Bedtime    rebella)  Sea Bands-Accupressure    Varicella (Chickenpox)  Unisom 1/2 tab TID     *No known complications           If received before Pain:         Known pregnancy;   Darvocet       Resume series after  Lortab        Delivery  Percocet    Yeast:   Tramadol      Femstat  Tylenol 3      Gyne-lotrimin  Ultram       Monistat  Vicodin           MISC:         All Sunscreens           Hair Coloring/highlights          Insect Repellant's          (Including DEET)         Mystic Tans

## 2017-06-25 ENCOUNTER — Telehealth: Payer: Self-pay | Admitting: Certified Nurse Midwife

## 2017-06-25 NOTE — Telephone Encounter (Signed)
FYI - Patient decided not to take the medication for depression she didn't even pick it up - we have CNLD the medication check up appointment and she is scheduling to see someone at the [facility].

## 2017-07-02 ENCOUNTER — Encounter: Payer: Medicaid Other | Admitting: Certified Nurse Midwife

## 2017-07-14 ENCOUNTER — Ambulatory Visit (INDEPENDENT_AMBULATORY_CARE_PROVIDER_SITE_OTHER): Payer: Medicaid Other | Admitting: Certified Nurse Midwife

## 2017-07-14 VITALS — BP 104/68 | HR 89 | Wt 152.3 lb

## 2017-07-14 DIAGNOSIS — Z23 Encounter for immunization: Secondary | ICD-10-CM

## 2017-07-14 DIAGNOSIS — Z3402 Encounter for supervision of normal first pregnancy, second trimester: Secondary | ICD-10-CM

## 2017-07-14 DIAGNOSIS — Z130 Encounter for screening for diseases of the blood and blood-forming organs and certain disorders involving the immune mechanism: Secondary | ICD-10-CM

## 2017-07-14 DIAGNOSIS — Z131 Encounter for screening for diabetes mellitus: Secondary | ICD-10-CM

## 2017-07-14 LAB — POCT URINALYSIS DIPSTICK
Bilirubin, UA: NEGATIVE
Glucose, UA: NEGATIVE
KETONES UA: NEGATIVE
Leukocytes, UA: NEGATIVE
Nitrite, UA: NEGATIVE
PH UA: 7 (ref 5.0–8.0)
PROTEIN UA: NEGATIVE
RBC UA: NEGATIVE
SPEC GRAV UA: 1.015 (ref 1.010–1.025)
UROBILINOGEN UA: 0.2 U/dL

## 2017-07-14 NOTE — Progress Notes (Signed)
ROB doing well. Discussed GTT, BTC, CBC, and Tdap at next visit. She had flu shot today. Reviewed fetal movement . She is complaining of fot pain at her arch on her right foot. Recommended orthotics. She agrees to plan. Follow up n 4 wks.   Doreene Burke, CNM

## 2017-07-14 NOTE — Patient Instructions (Addendum)
Td Vaccine (Tetanus and Diphtheria): What You Need to Know 1. Why get vaccinated? Tetanus  and diphtheria are very serious diseases. They are rare in the [place] today, but people who do become infected often have severe complications. Td vaccine is used to protect adolescents and adults from both of these diseases. Both tetanus and diphtheria are infections caused by bacteria. Diphtheria spreads from person to person through coughing or sneezing. Tetanus-causing bacteria enter the body through cuts, scratches, or wounds. TETANUS (lockjaw) causes painful muscle tightening and stiffness, usually all over the body.  It can lead to tightening of muscles in the head and neck so you can't open your mouth, swallow, or sometimes even breathe. Tetanus kills about 1 out of every 10 people who are infected even after receiving the best medical care.  DIPHTHERIA can cause a thick coating to form in the back of the throat.  It can lead to breathing problems, paralysis, heart failure, and death.  Before vaccines, as many as 200,000 cases of diphtheria and hundreds of cases of tetanus were reported in the [place] each year. Since vaccination began, reports of cases for both diseases have dropped by about 99%. 2. Td vaccine Td vaccine can protect adolescents and adults from tetanus and diphtheria. Td is usually given as a booster dose every 10 years but it can also be given earlier after a severe and dirty wound or burn. Another vaccine, called Tdap, which protects against pertussis in addition to tetanus and diphtheria, is sometimes recommended instead of Td vaccine. Your doctor or the person giving you the vaccine can give you more information. Td may safely be given at the same time as other vaccines. 3. Some people should not get this vaccine  A person who has ever had a life-threatening allergic reaction after a previous dose of any tetanus or diphtheria containing vaccine, OR has a severe  allergy to any part of this vaccine, should not get Td vaccine. Tell the person giving the vaccine about any severe allergies.  Talk to your doctor if you: ? had severe pain or swelling after any vaccine containing diphtheria or tetanus, ? ever had a condition called Guillain Barre Syndrome (GBS), ? aren't feeling well on the day the shot is scheduled. 4. What are the risks from Td vaccine? With any medicine, including vaccines, there is a chance of side effects. These are usually mild and go away on their own. Serious reactions are also possible but are rare. Most people who get Td vaccine do not have any problems with it. Mild problems following Td vaccine: (Did not interfere with activities)  Pain where the shot was given (about 8 people in 10)  Redness or swelling where the shot was given (about 1 person in 4)  Mild fever (rare)  Headache (about 1 person in 4)  Tiredness (about 1 person in 4)  Moderate problems following Td vaccine: (Interfered with activities, but did not require medical attention)  Fever over 102F (rare)  Severe problems following Td vaccine: (Unable to perform usual activities; required medical attention)  Swelling, severe pain, bleeding and/or redness in the arm where the shot was given (rare).  Problems that could happen after any vaccine:  People sometimes faint after a medical procedure, including vaccination. Sitting or lying down for about 15 minutes can help prevent fainting, and injuries caused by a fall. Tell your doctor if you feel dizzy, or have vision changes or ringing in the ears.  Some people get   severe pain in the shoulder and have difficulty moving the arm where a shot was given. This happens very rarely.  Any medication can cause a severe allergic reaction. Such reactions from a vaccine are very rare, estimated at fewer than 1 in a million doses, and would happen within a few minutes to a few hours after the vaccination. As with any  medicine, there is a very remote chance of a vaccine causing a serious injury or death. The safety of vaccines is always being monitored. For more information, visit: www.cdc.gov/vaccinesafety/ 5. What if there is a serious reaction? What should I look for? Look for anything that concerns you, such as signs of a severe allergic reaction, very high fever, or unusual behavior. Signs of a severe allergic reaction can include hives, swelling of the face and throat, difficulty breathing, a fast heartbeat, dizziness, and weakness. These would usually start a few minutes to a few hours after the vaccination. What should I do?  If you think it is a severe allergic reaction or other emergency that can't wait, call 9-1-1 or get the person to the nearest hospital. Otherwise, call your doctor.  Afterward, the reaction should be reported to the Vaccine Adverse Event Reporting System (VAERS). Your doctor might file this report, or you can do it yourself through the VAERS web site at www.vaers.hhs.gov, or by calling 1-800-822-7967. ? VAERS does not give medical advice. 6. The National Vaccine Injury Compensation Program The National Vaccine Injury Compensation Program (VICP) is a federal program that was created to compensate people who may have been injured by certain vaccines. Persons who believe they may have been injured by a vaccine can learn about the program and about filing a claim by calling 1-800-338-2382 or visiting the VICP website at www.hrsa.gov/vaccinecompensation. There is a time limit to file a claim for compensation. 7. How can I learn more?  Ask your doctor. He or she can give you the vaccine package insert or suggest other sources of information.  Call your local or state health department.  Contact the Centers for Disease Control and Prevention (CDC): ? Call 1-800-232-4636 (1-800-CDC-INFO) ? Visit CDC's website at www.cdc.gov/vaccines CDC Td Vaccine VIS (01/28/16) This information is  not intended to replace advice given to you by your health care provider. Make sure you discuss any questions you have with your health care provider. Document Released: 08/02/2006 Document Revised: 06/25/2016 Document Reviewed: 06/25/2016 Elsevier Interactive Patient Education  2017 Else[employer] Glucose Tolerance Test During Pregnancy The glucose tolerance test (GTT) is a blood test used to determine if you have developed a type of diabetes during pregnancy (gestational diabetes). This is when your body does not properly process sugar (glucose) in the food you eat, resulting in high blood glucose levels. Typically, a GTT is done after you have had a 1-hour glucose test with results that indicate you possibly have gestational diabetes. It may also be done if:  You have a history of giving birth to very large babies or have experienced repeated fetal loss (stillbirth).  You have signs and symptoms of diabetes, such as: ? Changes in your vision. ? Tingling or numbness in your hands or feet. ? Changes in hunger, thirst, and urination not otherwise explained by your pregnancy.  The GTT lasts about 3 hours. You will be given a sugar-water solution to drink at the beginning of the test. You will have blood drawn before you drink the solution and then again 1, 2, and 3 hours after you drink   it. You will not be allowed to eat or drink anything else during the test. You must remain at the testing location to make sure that your blood is drawn on time. You should also avoid exercising during the test, because exercise can alter test results. How do I prepare for this test? Eat normally for 3 days prior to the GTT test, including having plenty of carbohydrate-rich foods. Do not eat or drink anything except water during the final 12 hours before the test. In addition, your health care provider may ask you to stop taking certain medicines before the test. What do the results mean? It is your responsibility to  obtain your test results. Ask the lab or department performing the test when and how you will get your results. Contact your health care provider to discuss any questions you have about your results. Range of Normal Values Ranges for normal values may vary among different labs and hospitals. You should always check with your health care provider after having lab work or other tests done to discuss whether your values are considered within normal limits. Normal levels of blood glucose are as follows:  Fasting: less than 105 mg/dL.  1 hour after drinking the solution: less than 190 mg/dL.  2 hours after drinking the solution: less than 165 mg/dL.  3 hours after drinking the solution: less than 145 mg/dL.  Some substances can interfere with GTT results. These may include:  Blood pressure and heart failure medicines, including beta blockers, furosemide, and thiazides.  Anti-inflammatory medicines, including aspirin.  Nicotine.  Some psychiatric medicines.  Meaning of Results Outside Normal Value Ranges GTT test results that are above normal values may indicate a number of health problems, such as:  Gestational diabetes.  Acute stress response.  Cushing syndrome.  Tumors such as pheochromocytoma or glucagonoma.  Long-term kidney problems.  Pancreatitis.  Hyperthyroidism.  Current infection.  Discuss your test results with your health care provider. He or she will use the results to make a diagnosis and determine a treatment plan that is right for you. This information is not intended to replace advice given to you by your health care provider. Make sure you discuss any questions you have with your health care provider. Document Released: 04/05/2012 Document Revised: 03/12/2016 Document Reviewed: 02/09/2014 Elsevier Interactive Patient Education  2017 Elsevier Inc. Influenza (Flu) Vaccine (Inactivated or Recombinant): What You Need to Know 1. Why get vaccinated? Influenza  ("flu") is a contagious disease that spreads around the [place] every year, usually between October and May. Flu is caused by influenza viruses, and is spread mainly by coughing, sneezing, and close contact. Anyone can get flu. Flu strikes suddenly and can last several days. Symptoms vary by age, but can include:  fever/chills  sore throat  muscle aches  fatigue  cough  headache  runny or stuffy nose  Flu can also lead to pneumonia and blood infections, and cause diarrhea and seizures in children. If you have a medical condition, such as heart or lung disease, flu can make it worse. Flu is more dangerous for some people. Infants and young children, people 18 years of age and older, pregnant women, and people with certain health conditions or a weakened immune system are at greatest risk. Each year thousands of people in the [place] die from flu, and many more are hospitalized. Flu vaccine can:  keep you from getting flu,  make flu less severe if you do get it, and  keep you from  spreading flu to your family and other people. 2. Inactivated and recombinant flu vaccines A dose of flu vaccine is recommended every flu season. Children 6 months through 43 years of age may need two doses during the same flu season. Everyone else needs only one dose each flu season. Some inactivated flu vaccines contain a very small amount of a mercury-based preservative called thimerosal. Studies have not shown thimerosal in vaccines to be harmful, but flu vaccines that do not contain thimerosal are available. There is no live flu virus in flu shots. They cannot cause the flu. There are many flu viruses, and they are always changing. Each year a new flu vaccine is made to protect against three or four viruses that are likely to cause disease in the upcoming flu season. But even when the vaccine doesn't exactly match these viruses, it may still provide some protection. Flu vaccine cannot  prevent:  flu that is caused by a virus not covered by the vaccine, or  illnesses that look like flu but are not.  It takes about 2 weeks for protection to develop after vaccination, and protection lasts through the flu season. 3. Some people should not get this vaccine Tell the person who is giving you the vaccine:  If you have any severe, life-threatening allergies. If you ever had a life-threatening allergic reaction after a dose of flu vaccine, or have a severe allergy to any part of this vaccine, you may be advised not to get vaccinated. Most, but not all, types of flu vaccine contain a small amount of egg protein.  If you ever had Guillain-Barr Syndrome (also called GBS). Some people with a history of GBS should not get this vaccine. This should be discussed with your doctor.  If you are not feeling well. It is usually okay to get flu vaccine when you have a mild illness, but you might be asked to come back when you feel better.  4. Risks of a vaccine reaction With any medicine, including vaccines, there is a chance of reactions. These are usually mild and go away on their own, but serious reactions are also possible. Most people who get a flu shot do not have any problems with it. Minor problems following a flu shot include:  soreness, redness, or swelling where the shot was given  hoarseness  sore, red or itchy eyes  cough  fever  aches  headache  itching  fatigue  If these problems occur, they usually begin soon after the shot and last 1 or 2 days. More serious problems following a flu shot can include the following:  There may be a small increased risk of Guillain-Barre Syndrome (GBS) after inactivated flu vaccine. This risk has been estimated at 1 or 2 additional cases per million people vaccinated. This is much lower than the risk of severe complications from flu, which can be prevented by flu vaccine.  Young children who get the flu shot along with  pneumococcal vaccine (PCV13) and/or DTaP vaccine at the same time might be slightly more likely to have a seizure caused by fever. Ask your doctor for more information. Tell your doctor if a child who is getting flu vaccine has ever had a seizure.  Problems that could happen after any injected vaccine:  People sometimes faint after a medical procedure, including vaccination. Sitting or lying down for about 15 minutes can help prevent fainting, and injuries caused by a fall. Tell your doctor if you feel dizzy, or have vision changes  or ringing in the ears.  Some people get severe pain in the shoulder and have difficulty moving the arm where a shot was given. This happens very rarely.  Any medication can cause a severe allergic reaction. Such reactions from a vaccine are very rare, estimated at about 1 in a million doses, and would happen within a few minutes to a few hours after the vaccination. As with any medicine, there is a very remote chance of a vaccine causing a serious injury or death. The safety of vaccines is always being monitored. For more information, visit: http://floyd.org/ 5. What if there is a serious reaction? What should I look for? Look for anything that concerns you, such as signs of a severe allergic reaction, very high fever, or unusual behavior. Signs of a severe allergic reaction can include hives, swelling of the face and throat, difficulty breathing, a fast heartbeat, dizziness, and weakness. These would start a few minutes to a few hours after the vaccination. What should I do?  If you think it is a severe allergic reaction or other emergency that can't wait, call 9-1-1 and get the person to the nearest hospital. Otherwise, call your doctor.  Reactions should be reported to the Vaccine Adverse Event Reporting System (VAERS). Your doctor should file this report, or you can do it yourself through the VAERS web site at www.vaers.LAgents.no, or by calling  1-5714074176. ? VAERS does not give medical advice. 6. The National Vaccine Injury Compensation Program The [employer] Vaccine Injury Compensation Program (VICP) is a federal program that was created to compensate people who may have been injured by certain vaccines. Persons who believe they may have been injured by a vaccine can learn about the program and about filing a claim by calling 1-7267879439 or visiting the VICP website at SpiritualWord.at. There is a time limit to file a claim for compensation. 7. How can I learn more?  Ask your healthcare provider. He or she can give you the vaccine package insert or suggest other sources of information.  Call your local or state health department.  Contact the Centers for Disease Control and Prevention (CDC): ? Call 3170977596 (1-800-CDC-INFO) or ? Visit CDC's website at BiotechRoom.com.cy Vaccine Information Statement, Inactivated Influenza Vaccine (05/25/2014) This information is not intended to replace advice given to you by your health care provider. Make sure you discuss any questions you have with your health care provider. Document Released: 07/30/2006 Document Revised: 06/25/2016 Document Reviewed: 06/25/2016 Elsevier Interactive Patient Education  2017 [employer].

## 2017-08-11 ENCOUNTER — Encounter: Payer: Self-pay | Admitting: Certified Nurse Midwife

## 2017-08-11 ENCOUNTER — Ambulatory Visit (INDEPENDENT_AMBULATORY_CARE_PROVIDER_SITE_OTHER): Payer: Medicaid Other | Admitting: Certified Nurse Midwife

## 2017-08-11 ENCOUNTER — Other Ambulatory Visit: Payer: Medicaid Other

## 2017-08-11 VITALS — BP 109/71 | HR 85 | Wt 162.2 lb

## 2017-08-11 DIAGNOSIS — Z23 Encounter for immunization: Secondary | ICD-10-CM

## 2017-08-11 DIAGNOSIS — Z131 Encounter for screening for diabetes mellitus: Secondary | ICD-10-CM

## 2017-08-11 DIAGNOSIS — Z3402 Encounter for supervision of normal first pregnancy, second trimester: Secondary | ICD-10-CM

## 2017-08-11 DIAGNOSIS — Z130 Encounter for screening for diseases of the blood and blood-forming organs and certain disorders involving the immune mechanism: Secondary | ICD-10-CM

## 2017-08-11 LAB — POCT URINALYSIS DIPSTICK
BILIRUBIN UA: NEGATIVE
Blood, UA: NEGATIVE
GLUCOSE UA: NEGATIVE
KETONES UA: NEGATIVE
Leukocytes, UA: NEGATIVE
NITRITE UA: NEGATIVE
Protein, UA: NEGATIVE
Spec Grav, UA: 1.02 (ref 1.010–1.025)
Urobilinogen, UA: 0.2 E.U./dL
pH, UA: 7 (ref 5.0–8.0)

## 2017-08-11 NOTE — Progress Notes (Signed)
ROB- Pt states she had some sharp pain some time last week but it has subside, pt is also concerned about the circulation in her arms

## 2017-08-11 NOTE — Progress Notes (Signed)
ROB , doing well. Pt states that the fetal movement has changed. Reviewed fetal movement discussed that it may feel different due to increasing size of baby but that she should continue to feel movement. She verbalizes understanding. Also reviewed cord blood donation, classes at [facility], answered questions about breastfeeding, and birth plan. 1 hr GTT, BTC , CBC, and TDap today . Will follow up with result. ROB in 2 wks.   Doreene Burke, CNM

## 2017-08-11 NOTE — Patient Instructions (Addendum)
Cord Blood Banking Information Cord blood banking is the process of collecting and storing the blood that is in the umbilical cord and placenta at the time of delivery. This blood contains stem cells, which can be used to treat many blood diseases, immune system disorders, and childhood cancers. Stem cells can also be used to research certain diseases and treatments. Many people who choose cord blood banking donate the blood. Donated blood can be used in lifesaving treatments or for research. Other people choose to store the blood privately. Blood that is stored privately can only be used with the person's permission. This option is often chosen if:  A family member needs a stem cell transplant.  The child is part of an ethnic minority.  The child was conceived through in vitro fertilization.  What should I look for in a blood bank? A blood bank is the organization that coordinates cord blood banking. Make sure the cord blood bank that you use:  Is accredited.  Is financially stable.  Handles a large volume of cord blood samples.  Has a procedure in place for transport and storage.  Allows you the option of transferring your cord blood sample.  Has a procedure in place if the bank goes out of business.  Clearly states all costs and limits to future costs.  People who choose to donate cord blood should not need to pay for blood banking. People who keep the blood for private use will need to pay for the first (initial) storage and pay a fee each year (annual fee). Other fees may also apply. What are the risks of cord blood banking? There are no health risks associated with cord blood banking. It is considered safe. How should I prepare? You must schedule this process at least 4-6 weeks before you will be giving birth. How is the blood collected? The blood is collected as soon as the baby has been delivered. Within 15 minutes of delivery, a health care provider will take these actions  to collect the blood:  Clamp the umbilical cord at the top and bottom. This traps the blood in the umbilical cord.  Use a syringe or bag to collect the blood.  Insert needles into the placenta to collect (draw out) more blood.  What happens after the blood is collected? After the blood has been collected:  The blood will be sent to a blood bank.  The blood will be tested for genetic problems and infectious diseases. If the blood tests positive for a genetic problem or a disease, someone will contact you and let you know.  The blood will be frozen.  If your child develops a genetic condition, immune system disorder, or cancer, you will be responsible for contacting the blood bank and letting them know. This information is not intended to replace advice given to you by your health care provider. Make sure you discuss any questions you have with your health care provider. Document Released: 03/25/2010 Document Revised: 03/12/2016 Document Reviewed: 03/25/2015 Elsevier Interactive Patient Education  2018 [place]. Glucose Tolerance Test During Pregnancy The glucose tolerance test (GTT) is a blood test used to determine if you have developed a type of diabetes during pregnancy (gestational diabetes). This is when your body does not properly process sugar (glucose) in the food you eat, resulting in high blood glucose levels. Typically, a GTT is done after you have had a 1-hour glucose test with results that indicate you possibly have gestational diabetes. It may also be done  if:  You have a history of giving birth to very large babies or have experienced repeated fetal loss (stillbirth).  You have signs and symptoms of diabetes, such as: ? Changes in your vision. ? Tingling or numbness in your hands or feet. ? Changes in hunger, thirst, and urination not otherwise explained by your pregnancy.  The GTT lasts about 3 hours. You will be given a sugar-water solution to drink at the  beginning of the test. You will have blood drawn before you drink the solution and then again 1, 2, and 3 hours after you drink it. You will not be allowed to eat or drink anything else during the test. You must remain at the testing location to make sure that your blood is drawn on time. You should also avoid exercising during the test, because exercise can alter test results. How do I prepare for this test? Eat normally for 3 days prior to the GTT test, including having plenty of carbohydrate-rich foods. Do not eat or drink anything except water during the final 12 hours before the test. In addition, your health care provider may ask you to stop taking certain medicines before the test. What do the results mean? It is your responsibility to obtain your test results. Ask the lab or department performing the test when and how you will get your results. Contact your health care provider to discuss any questions you have about your results. Range of Normal Values Ranges for normal values may vary among different labs and hospitals. You should always check with your health care provider after having lab work or other tests done to discuss whether your values are considered within normal limits. Normal levels of blood glucose are as follows:  Fasting: less than 105 mg/dL.  1 hour after drinking the solution: less than 190 mg/dL.  2 hours after drinking the solution: less than 165 mg/dL.  3 hours after drinking the solution: less than 145 mg/dL.  Some substances can interfere with GTT results. These may include:  Blood pressure and heart failure medicines, including beta blockers, furosemide, and thiazides.  Anti-inflammatory medicines, including aspirin.  Nicotine.  Some psychiatric medicines.  Meaning of Results Outside Normal Value Ranges GTT test results that are above normal values may indicate a number of health problems, such as:  Gestational diabetes.  Acute stress  response.  Cushing syndrome.  Tumors such as pheochromocytoma or glucagonoma.  Long-term kidney problems.  Pancreatitis.  Hyperthyroidism.  Current infection.  Discuss your test results with your health care provider. He or she will use the results to make a diagnosis and determine a treatment plan that is right for you. This information is not intended to replace advice given to you by your health care provider. Make sure you discuss any questions you have with your health care provider. Document Released: 04/05/2012 Document Revised: 03/12/2016 Document Reviewed: 02/09/2014 Elsevier Interactive Patient Education  2017 [employer]  Fetal Movement Counts Patient Name: ________________________________________________ Patient Due Date: ____________________ What is a fetal movement count? A fetal movement count is the number of times that you feel your baby move during a certain amount of time. This may also be called a fetal kick count. A fetal movement count is recommended for every pregnant woman. You may be asked to start counting fetal movements as early as week 28 of your pregnancy. Pay attention to when your baby is most active. You may notice your baby's sleep and wake cycles. You may also notice things that  make your baby move more. You should do a fetal movement count:  When your baby is normally most active.  At the same time each day.  A good time to count movements is while you are resting, after having something to eat and drink. How do I count fetal movements? 1. Find a quiet, comfortable area. Sit, or lie down on your side. 2. Write down the date, the start time and stop time, and the number of movements that you felt between those two times. Take this information with you to your health care visits. 3. For 2 hours, count kicks, flutters, swishes, rolls, and jabs. You should feel at least 10 movements during 2 hours. 4. You may stop counting after you have felt 10  movements. 5. If you do not feel 10 movements in 2 hours, have something to eat and drink. Then, keep resting and counting for 1 hour. If you feel at least 4 movements during that hour, you may stop counting. Contact a health care provider if:  You feel fewer than 4 movements in 2 hours.  Your baby is not moving like he or she usually does. Date: ____________ Start time: ____________ Stop time: ____________ Movements: ____________ Date: ____________ Start time: ____________ Stop time: ____________ Movements: ____________ Date: ____________ Start time: ____________ Stop time: ____________ Movements: ____________ Date: ____________ Start time: ____________ Stop time: ____________ Movements: ____________ Date: ____________ Start time: ____________ Stop time: ____________ Movements: ____________ Date: ____________ Start time: ____________ Stop time: ____________ Movements: ____________ Date: ____________ Start time: ____________ Stop time: ____________ Movements: ____________ Date: ____________ Start time: ____________ Stop time: ____________ Movements: ____________ Date: ____________ Start time: ____________ Stop time: ____________ Movements: ____________ This information is not intended to replace advice given to you by your health care provider. Make sure you discuss any questions you have with your health care provider. Document Released: 11/04/2006 Document Revised: 06/03/2016 Document Reviewed: 11/14/2015 Elsevier Interactive Patient Education  [employer].

## 2017-08-12 ENCOUNTER — Encounter: Payer: Self-pay | Admitting: Certified Nurse Midwife

## 2017-08-12 LAB — CBC WITH DIFFERENTIAL/PLATELET
Basophils Absolute: 0 10*3/uL (ref 0.0–0.2)
Basos: 0 %
EOS (ABSOLUTE): 0.1 10*3/uL (ref 0.0–0.4)
EOS: 1 %
HEMATOCRIT: 38.6 % (ref 34.0–46.6)
HEMOGLOBIN: 12.6 g/dL (ref 11.1–15.9)
IMMATURE GRANULOCYTES: 3 %
Immature Grans (Abs): 0.3 10*3/uL — ABNORMAL HIGH (ref 0.0–0.1)
LYMPHS: 12 %
Lymphocytes Absolute: 1.6 10*3/uL (ref 0.7–3.1)
MCH: 30.2 pg (ref 26.6–33.0)
MCHC: 32.6 g/dL (ref 31.5–35.7)
MCV: 93 fL (ref 79–97)
MONOCYTES: 8 %
Monocytes Absolute: 1 10*3/uL — ABNORMAL HIGH (ref 0.1–0.9)
Neutrophils Absolute: 10.4 10*3/uL — ABNORMAL HIGH (ref 1.4–7.0)
Neutrophils: 76 %
Platelets: 253 10*3/uL (ref 150–379)
RBC: 4.17 x10E6/uL (ref 3.77–5.28)
RDW: 14.1 % (ref 12.3–15.4)
WBC: 13.5 10*3/uL — AB (ref 3.4–10.8)

## 2017-08-12 LAB — GLUCOSE, 1 HOUR GESTATIONAL: Gestational Diabetes Screen: 90 mg/dL (ref 65–139)

## 2017-08-26 ENCOUNTER — Ambulatory Visit (INDEPENDENT_AMBULATORY_CARE_PROVIDER_SITE_OTHER): Payer: Medicaid Other | Admitting: Obstetrics and Gynecology

## 2017-08-26 VITALS — BP 110/74 | HR 88 | Wt 163.0 lb

## 2017-08-26 DIAGNOSIS — Z3493 Encounter for supervision of normal pregnancy, unspecified, third trimester: Secondary | ICD-10-CM

## 2017-08-26 LAB — POCT URINALYSIS DIPSTICK
BILIRUBIN UA: NEGATIVE
Blood, UA: NEGATIVE
Glucose, UA: NEGATIVE
KETONES UA: NEGATIVE
LEUKOCYTES UA: NEGATIVE
Nitrite, UA: NEGATIVE
PH UA: 6.5 (ref 5.0–8.0)
Protein, UA: NEGATIVE
Spec Grav, UA: 1.01 (ref 1.010–1.025)
Urobilinogen, UA: 0.2 E.U./dL

## 2017-08-26 NOTE — Progress Notes (Signed)
ROB- pt is doing well, c/o being tired all the time

## 2017-08-26 NOTE — Progress Notes (Signed)
ROB- Swelling in all extremities and worse in ankles, tired more recently. Working as [employer]. Recommend compression socks. Discussed circumcision.

## 2017-09-13 ENCOUNTER — Ambulatory Visit (INDEPENDENT_AMBULATORY_CARE_PROVIDER_SITE_OTHER): Payer: Medicaid Other | Admitting: Certified Nurse Midwife

## 2017-09-13 VITALS — BP 108/74 | HR 92 | Wt 172.1 lb

## 2017-09-13 DIAGNOSIS — Z3403 Encounter for supervision of normal first pregnancy, third trimester: Secondary | ICD-10-CM

## 2017-09-13 LAB — POCT URINALYSIS DIPSTICK
Bilirubin, UA: NEGATIVE
Blood, UA: NEGATIVE
Glucose, UA: NEGATIVE
Ketones, UA: NEGATIVE
LEUKOCYTES UA: NEGATIVE
Nitrite, UA: NEGATIVE
PH UA: 7.5 (ref 5.0–8.0)
PROTEIN UA: NEGATIVE
SPEC GRAV UA: 1.015 (ref 1.010–1.025)
UROBILINOGEN UA: 0.2 U/dL

## 2017-09-13 NOTE — Progress Notes (Signed)
Pt is here for an ROB. Is c/o feet swelling more on left than right. Also hands are swelling.

## 2017-09-13 NOTE — Patient Instructions (Signed)
Common Medications Safe in Pregnancy  Acne:      Constipation:  Benzoyl Peroxide     Colace  Clindamycin      Dulcolax Suppository  Topica Erythromycin     Fibercon  Salicylic Acid      Metamucil         Miralax AVOID:        Senakot   Accutane    Cough:  Retin-A       Cough Drops  Tetracycline      Phenergan w/ Codeine if Rx  Minocycline      Robitussin (Plain & DM)  Antibiotics:     Crabs/Lice:  Ceclor       RID  Cephalosporins    AVOID:  E-Mycins      Kwell  Keflex  Macrobid/Macrodantin   Diarrhea:  Penicillin      Kao-Pectate  Zithromax      Imodium AD         PUSH FLUIDS AVOID:       Cipro     Fever:  Tetracycline      Tylenol (Regular or Extra  Minocycline       Strength)  Levaquin      Extra Strength-Do not          Exceed 8 tabs/24 hrs Caffeine:        <200mg/day (equiv. To 1 cup of coffee or  approx. 3 12 oz sodas)         Gas: Cold/Hayfever:       Gas-X  Benadryl      Mylicon  Claritin       Phazyme  **Claritin-D        Chlor-Trimeton    Headaches:  Dimetapp      ASA-Free Excedrin  Drixoral-Non-Drowsy     Cold Compress  Mucinex (Guaifenasin)     Tylenol (Regular or Extra  Sudafed/Sudafed-12 Hour     Strength)  **Sudafed PE Pseudoephedrine   Tylenol Cold & Sinus     Vicks Vapor Rub  Zyrtec  **AVOID if Problems With Blood Pressure         Heartburn: Avoid lying down for at least 1 hour after meals  Aciphex      Maalox     Rash:  Milk of Magnesia     Benadryl    Mylanta       1% Hydrocortisone Cream  Pepcid  Pepcid Complete   Sleep Aids:  Prevacid      Ambien   Prilosec       Benadryl  Rolaids       Chamomile Tea  Tums (Limit 4/day)     Unisom  Zantac       Tylenol PM         Warm milk-add vanilla or  Hemorrhoids:       Sugar for taste  Anusol/Anusol H.C.  (RX: Analapram 2.5%)  Sugar Substitutes:  Hydrocortisone OTC     Ok in moderation  Preparation H      Tucks        Vaseline lotion applied to tissue with  wiping    Herpes:     Throat:  Acyclovir      Oragel  Famvir  Valtrex     Vaccines:         Flu Shot Leg Cramps:       *Gardasil  Benadryl      Hepatitis A         Hepatitis B Nasal Spray:         Pneumovax  Saline Nasal Spray     Polio Booster         Tetanus Nausea:       Tuberculosis test or PPD  Vitamin B6 25 mg TID   AVOID:    Dramamine      *Gardasil  Emetrol       Live Poliovirus  Ginger Root 250 mg QID    MMR (measles, mumps &  High Complex Carbs @ Bedtime    rebella)  Sea Bands-Accupressure    Varicella (Chickenpox)  Unisom 1/2 tab TID     *No known complications           If received before Pain:         Known pregnancy;   Darvocet       Resume series after  Lortab        Delivery  Percocet    Yeast:   Tramadol      Femstat  Tylenol 3      Gyne-lotrimin  Ultram       Monistat  Vicodin           MISC:         All Sunscreens           Hair Coloring/highlights          Insect Repellant's          (Including DEET)         [place] Tans Eating Plan for Pregnant Women While you are pregnant, your body will require additional nutrition to help support your growing baby. It is recommended that you consume:  150 additional calories each day during your first trimester.  300 additional calories each day during your second trimester.  300 additional calories each day during your third trimester.  Eating a healthy, well-balanced diet is very important for your health and for your baby's health. You also have a higher need for some vitamins and minerals, such as folic acid, calcium, iron, and vitamin D. What do I need to know about eating during pregnancy?  Do not try to lose weight or go on a diet during pregnancy.  Choose healthy, nutritious foods. Choose  of a sandwich with a glass of milk instead of a candy bar or a high-calorie sugar-sweetened beverage.  Limit your overall intake of foods that have "empty calories." These are foods that have little nutritional  value, such as sweets, desserts, candies, sugar-sweetened beverages, and fried foods.  Eat a variety of foods, especially fruits and vegetables.  Take a prenatal vitamin to help meet the additional needs during pregnancy, specifically for folic acid, iron, calcium, and vitamin D.  Remember to stay active. Ask your health care provider for exercise recommendations that are specific to you.  Practice good food safety and cleanliness, such as washing your hands before you eat and after you prepare raw meat. This helps to prevent foodborne illnesses, such as listeriosis, that can be very dangerous for your baby. Ask your health care provider for more information about listeriosis. What does 150 extra calories look like? Healthy options for an additional 150 calories each day could be any of the following:  Plain low-fat yogurt (6-8 oz) with  cup of berries.  1 apple with 2 teaspoons of peanut butter.  Cut-up vegetables with  cup of hummus.  Low-fat chocolate milk (8 oz or 1 cup).  1 string cheese with 1 medium orange.   of a peanut butter and jelly sandwich on whole-wheat bread (1   tsp of peanut butter).  For 300 calories, you could eat two of those healthy options each day. What is a healthy amount of weight to gain? The recommended amount of weight for you to gain is based on your pre-pregnancy BMI. If your pre-pregnancy BMI was:  Less than 18 (underweight), you should gain 28-40 lb.  18-24.9 (normal), you should gain 25-35 lb.  25-29.9 (overweight), you should gain 15-25 lb.  Greater than 30 (obese), you should gain 11-20 lb.  What if I am having twins or multiples? Generally, pregnant women who will be having twins or multiples may need to increase their daily calories by 300-600 calories each day. The recommended range for total weight gain is 25-54 lb, depending on your pre-pregnancy BMI. Talk with your health care provider for specific guidance about additional nutritional  needs, weight gain, and exercise during your pregnancy. What foods can I eat? Grains Any grains. Try to choose whole grains, such as whole-wheat bread, oatmeal, or brown rice. Vegetables Any vegetables. Try to eat a variety of colors and types of vegetables to get a full range of vitamins and minerals. Remember to wash your vegetables well before eating. Fruits Any fruits. Try to eat a variety of colors and types of fruit to get a full range of vitamins and minerals. Remember to wash your fruits well before eating. Meats and Other Protein Sources Lean meats, including chicken, [place], fish, and lean cuts of beef, veal, or pork. Make sure that all meats are cooked to "well done." Tofu. Tempeh. Beans. Eggs. Peanut butter and other nut butters. Seafood, such as shrimp, crab, and lobster. If you choose fish, select types that are higher in omega-3 fatty acids, including salmon, herring, mussels, trout, sardines, and pollock. Make sure that all meats are cooked to food-safe temperatures. Dairy Pasteurized milk and milk alternatives. Pasteurized yogurt and pasteurized cheese. Cottage cheese. Sour cream. Beverages Water. Juices that contain 100% fruit juice or vegetable juice. Caffeine-free teas and decaffeinated coffee. Drinks that contain caffeine are okay to drink, but it is better to avoid caffeine. Keep your total caffeine intake to less than 200 mg each day (12 oz of coffee, tea, or soda) or as directed by your health care provider. Condiments Any pasteurized condiments. Sweets and Desserts Any sweets and desserts. Fats and Oils Any fats and oils. The items listed above may not be a complete list of recommended foods or beverages. Contact your dietitian for more options. What foods are not recommended? Vegetables Unpasteurized (raw) vegetable juices. Fruits Unpasteurized (raw) fruit juices. Meats and Other Protein Sources Cured meats that have nitrates, such as bacon, salami, and hotdogs.  Luncheon meats, bologna, or other deli meats (unless they are reheated until they are steaming hot). Refrigerated pate, meat spreads from a meat counter, smoked seafood that is found in the refrigerated section of a store. Raw fish, such as sushi or sashimi. High mercury content fish, such as tilefish, shark, swordfish, and king mackerel. Raw meats, such as tuna or beef tartare. Undercooked meats and poultry. Make sure that all meats are cooked to food-safe temperatures. Dairy Unpasteurized (raw) milk and any foods that have raw milk in them. Soft cheeses, such as feta, queso blanco, queso fresco, Brie, Camembert cheeses, blue-veined cheeses, and Panela cheese (unless it is made with pasteurized milk, which must be stated on the label). Beverages Alcohol. Sugar-sweetened beverages, such as sodas, teas, or energy drinks. Condiments Homemade fermented foods and drinks, such as pickles, sauerkraut, or kombucha drinks. (Store-bought  pasteurized versions of these are okay.) Other Salads that are made in the store, such as ham salad, chicken salad, egg salad, tuna salad, and seafood salad. The items listed above may not be a complete list of foods and beverages to avoid. Contact your dietitian for more information. This information is not intended to replace advice given to you by your health care provider. Make sure you discuss any questions you have with your health care provider. Document Released: 07/20/2014 Document Revised: 03/12/2016 Document Reviewed: 03/20/2014 Elsevier Interactive Patient Education  2018 [employer] Inc.  

## 2017-09-13 NOTE — Progress Notes (Signed)
ROB-Reports bilateral lower extremity swelling and carpal tunnel syndrome. Discussed home treatment measures and handout including carpal tunnel exercises given. Pt has ordered compression stockings for work, but they have not arrived yet. Encouraged use of abdominal support, compression stockings, and adequate hydration. Education regarding pregnancy diet and weight gain ~9 pounds over last two weeks. Provided brochure for [employer] program. Reviewed red flag symptoms and when to call. RTC x 2-3 weeks for 36 week cultures and ROB or sooner if needed.

## 2017-09-27 ENCOUNTER — Encounter: Payer: Medicaid Other | Admitting: Certified Nurse Midwife

## 2017-09-29 ENCOUNTER — Encounter: Payer: Self-pay | Admitting: Certified Nurse Midwife

## 2017-09-29 ENCOUNTER — Ambulatory Visit (INDEPENDENT_AMBULATORY_CARE_PROVIDER_SITE_OTHER): Payer: Medicaid Other | Admitting: Certified Nurse Midwife

## 2017-09-29 VITALS — BP 119/72 | HR 93 | Wt 179.1 lb

## 2017-09-29 DIAGNOSIS — Z113 Encounter for screening for infections with a predominantly sexual mode of transmission: Secondary | ICD-10-CM

## 2017-09-29 DIAGNOSIS — Z3493 Encounter for supervision of normal pregnancy, unspecified, third trimester: Secondary | ICD-10-CM

## 2017-09-29 DIAGNOSIS — Z3685 Encounter for antenatal screening for Streptococcus B: Secondary | ICD-10-CM

## 2017-09-29 LAB — POCT URINALYSIS DIPSTICK
BILIRUBIN UA: NEGATIVE
Blood, UA: NEGATIVE
GLUCOSE UA: NEGATIVE
KETONES UA: NEGATIVE
Leukocytes, UA: NEGATIVE
NITRITE UA: NEGATIVE
PROTEIN UA: NEGATIVE
SPEC GRAV UA: 1.01 (ref 1.010–1.025)
Urobilinogen, UA: 0.2 E.U./dL
pH, UA: 6.5 (ref 5.0–8.0)

## 2017-09-29 LAB — OB RESULTS CONSOLE GBS: GBS: POSITIVE

## 2017-09-29 NOTE — Progress Notes (Signed)
ROB- cultures obtained, pt is having some pelvic pressure 

## 2017-09-29 NOTE — Progress Notes (Signed)
ROB doing well. No complaints, feels baby moving. Irregular contractions. Discussed PTL precautions. GBS today, questions answered. PT verbalizes understanding. Return in 1 wk.   Doreene BurkeAnnie Hannie Shoe, CNM

## 2017-09-29 NOTE — Patient Instructions (Signed)
Group B Streptococcus Infection During Pregnancy Group B Streptococcus (GBS) is a type of bacteria (Streptococcus agalactiae) that is often found in healthy people, commonly in the rectum, vagina, and intestines. In people who are healthy and not pregnant, the bacteria rarely cause serious illness or complications. However, women who test positive for GBS during pregnancy can pass the bacteria to their baby during childbirth, which can cause serious infection in the baby after birth. Women with GBS may also have infections during their pregnancy or immediately after childbirth, such as such as urinary tract infections (UTIs) or infections of the uterus (uterine infections). Having GBS also increases a woman's risk of complications during pregnancy, such as early (preterm) labor or delivery, miscarriage, or stillbirth. Routine testing (screening) for GBS is recommended for all pregnant women. What increases the risk? You may have a higher risk for GBS infection during pregnancy if you had one during a past pregnancy. What are the signs or symptoms? In most cases, GBS infection does not cause symptoms in pregnant women. Signs and symptoms of a possible GBS-related infection may include:  Labor starting before the 37th week of pregnancy.  A UTI or bladder infection, which may cause: ? Fever. ? Pain or burning during urination. ? Frequent urination.  Fever during labor, along with: ? Bad-smelling discharge. ? Uterine tenderness. ? Rapid heartbeat in the mother, baby, or both.  Rare but serious symptoms of a possible GBS-related infection in women include:  Blood infection (septicemia). This may cause fever, chills, or confusion.  Lung infection (pneumonia). This may cause fever, chills, cough, rapid breathing, difficulty breathing, or chest pain.  Bone, joint, skin, or soft tissue infection.  How is this diagnosed? You may be screened for GBS between week 35 and week 37 of your pregnancy. If  you have symptoms of preterm labor, you may be screened earlier. This condition is diagnosed based on lab test results from:  A swab of fluid from the vagina and rectum.  A urine sample.  How is this treated? This condition is treated with antibiotic medicine. When you go into labor, or as soon as your water breaks (your membranes rupture), you will be given antibiotics through an IV tube. Antibiotics will continue until after you give birth. If you are having a cesarean delivery, you do not need antibiotics unless your membranes have already ruptured. Follow these instructions at home:  Take over-the-counter and prescription medicines only as told by your health care provider.  Take your antibiotic medicine as told by your health care provider. Do not stop taking the antibiotic even if you start to feel better.  Keep all pre-birth (prenatal) visits and follow-up visits as told by your health care provider. This is important. Contact a health care provider if:  You have pain or burning when you urinate.  You have to urinate frequently.  You have a fever or chills.  You develop a bad-smelling vaginal discharge. Get help right away if:  Your membranes rupture.  You go into labor.  You have severe pain in your abdomen.  You have difficulty breathing.  You have chest pain. This information is not intended to replace advice given to you by your health care provider. Make sure you discuss any questions you have with your health care provider. Document Released: 01/12/2008 Document Revised: 05/01/2016 Document Reviewed: 04/30/2016 Elsevier Interactive Patient Education  2018 [employer]  

## 2017-10-01 LAB — GC/CHLAMYDIA PROBE AMP
Chlamydia trachomatis, NAA: NEGATIVE
Neisseria gonorrhoeae by PCR: NEGATIVE

## 2017-10-04 ENCOUNTER — Encounter: Payer: Self-pay | Admitting: Certified Nurse Midwife

## 2017-10-04 LAB — STREP GP B SUSCEPTIBILITY

## 2017-10-04 LAB — STREP GP B NAA+RFLX: Strep Gp B NAA+Rflx: POSITIVE — AB

## 2017-10-07 ENCOUNTER — Inpatient Hospital Stay
Admission: EM | Admit: 2017-10-07 | Discharge: 2017-10-10 | DRG: 807 | Disposition: A | Discharge status: Home / Self Care | Payer: Medicaid Other | Attending: Obstetrics and Gynecology | Admitting: Obstetrics and Gynecology

## 2017-10-07 ENCOUNTER — Ambulatory Visit (INDEPENDENT_AMBULATORY_CARE_PROVIDER_SITE_OTHER): Payer: Medicaid Other | Admitting: Obstetrics and Gynecology

## 2017-10-07 ENCOUNTER — Other Ambulatory Visit: Payer: Self-pay

## 2017-10-07 VITALS — BP 152/90 | HR 103 | Wt 179.0 lb

## 2017-10-07 DIAGNOSIS — Z8759 Personal history of other complications of pregnancy, childbirth and the puerperium: Secondary | ICD-10-CM | POA: Diagnosis present

## 2017-10-07 DIAGNOSIS — Z880 Allergy status to penicillin: Secondary | ICD-10-CM

## 2017-10-07 DIAGNOSIS — O99824 Streptococcus B carrier state complicating childbirth: Secondary | ICD-10-CM | POA: Diagnosis present

## 2017-10-07 DIAGNOSIS — Z3493 Encounter for supervision of normal pregnancy, unspecified, third trimester: Secondary | ICD-10-CM

## 2017-10-07 DIAGNOSIS — R030 Elevated blood-pressure reading, without diagnosis of hypertension: Secondary | ICD-10-CM | POA: Diagnosis present

## 2017-10-07 DIAGNOSIS — Z3A37 37 weeks gestation of pregnancy: Secondary | ICD-10-CM | POA: Diagnosis not present

## 2017-10-07 DIAGNOSIS — O1493 Unspecified pre-eclampsia, third trimester: Secondary | ICD-10-CM

## 2017-10-07 DIAGNOSIS — O1404 Mild to moderate pre-eclampsia, complicating childbirth: Secondary | ICD-10-CM | POA: Diagnosis present

## 2017-10-07 HISTORY — DX: Other specified health status: Z78.9

## 2017-10-07 LAB — CBC WITH DIFFERENTIAL/PLATELET
BASOS PCT: 0 %
Basophils Absolute: 0 10*3/uL (ref 0–0.1)
Eosinophils Absolute: 0.1 10*3/uL (ref 0–0.7)
Eosinophils Relative: 1 %
HEMATOCRIT: 40.1 % (ref 35.0–47.0)
Hemoglobin: 13.9 g/dL (ref 12.0–16.0)
LYMPHS ABS: 1.3 10*3/uL (ref 1.0–3.6)
Lymphocytes Relative: 10 %
MCH: 31.5 pg (ref 26.0–34.0)
MCHC: 34.7 g/dL (ref 32.0–36.0)
MCV: 90.8 fL (ref 80.0–100.0)
MONO ABS: 1 10*3/uL — AB (ref 0.2–0.9)
MONOS PCT: 8 %
Neutro Abs: 9.8 10*3/uL — ABNORMAL HIGH (ref 1.4–6.5)
Neutrophils Relative %: 81 %
Platelets: 232 10*3/uL (ref 150–440)
RBC: 4.42 MIL/uL (ref 3.80–5.20)
RDW: 12.9 % (ref 11.5–14.5)
WBC: 12.2 10*3/uL — ABNORMAL HIGH (ref 3.6–11.0)

## 2017-10-07 LAB — POCT URINALYSIS DIPSTICK
Bilirubin, UA: NEGATIVE
Blood, UA: NEGATIVE
Glucose, UA: NEGATIVE
Ketones, UA: NEGATIVE
LEUKOCYTES UA: NEGATIVE
NITRITE UA: NEGATIVE
Spec Grav, UA: 1.02 (ref 1.010–1.025)
Urobilinogen, UA: 0.2 E.U./dL
pH, UA: 7 (ref 5.0–8.0)

## 2017-10-07 LAB — BASIC METABOLIC PANEL
Anion gap: 9 (ref 5–15)
BUN: 5 mg/dL — AB (ref 6–20)
CALCIUM: 9 mg/dL (ref 8.9–10.3)
CHLORIDE: 105 mmol/L (ref 101–111)
CO2: 21 mmol/L — AB (ref 22–32)
CREATININE: 0.49 mg/dL (ref 0.44–1.00)
GFR calc Af Amer: >60 mL/min (ref >60)
GFR calc non Af Amer: >60 mL/min (ref >60)
GLUCOSE: 71 mg/dL (ref 65–99)
Potassium: 3.5 mmol/L (ref 3.5–5.1)
Sodium: 135 mmol/L (ref 135–145)

## 2017-10-07 LAB — HEPATIC FUNCTION PANEL
ALBUMIN: 2.8 g/dL — AB (ref 3.5–5.0)
ALK PHOS: 196 U/L — AB (ref 38–126)
ALT: 16 U/L (ref 14–54)
AST: 28 U/L (ref 15–41)
Bilirubin, Direct: <0.1 mg/dL — ABNORMAL LOW (ref 0.1–0.5)
Total Bilirubin: 0.5 mg/dL (ref 0.3–1.2)
Total Protein: 6.4 g/dL — ABNORMAL LOW (ref 6.5–8.1)

## 2017-10-07 LAB — TYPE AND SCREEN
ABO/RH(D): O POS
ANTIBODY SCREEN: NEGATIVE

## 2017-10-07 LAB — OB RESULTS CONSOLE RPR: RPR: NONREACTIVE

## 2017-10-07 LAB — PROTEIN / CREATININE RATIO, URINE
CREATININE, URINE: 137 mg/dL
Protein Creatinine Ratio: 0.12 mg/mg{Cre} (ref 0.00–0.15)
TOTAL PROTEIN, URINE: 17 mg/dL

## 2017-10-07 MED ORDER — LACTATED RINGERS IV SOLN: 500.0000 mL | INTRAVENOUS | Status: DC | PRN

## 2017-10-07 MED ORDER — LIDOCAINE HCL (PF) 1 % IJ SOLN
INTRAMUSCULAR | Status: AC
  Filled 2017-10-07: qty 30

## 2017-10-07 MED ORDER — MISOPROSTOL 25 MCG QUARTER TABLET
ORAL_TABLET | ORAL | Status: AC
  Administered 2017-10-07: 25 ug via VAGINAL
  Filled 2017-10-07: qty 1

## 2017-10-07 MED ORDER — SOD CITRATE-CITRIC ACID 500-334 MG/5ML PO SOLN: 30.0000 mL | ORAL | Status: DC | PRN

## 2017-10-07 MED ORDER — LACTATED RINGERS IV SOLN: 2.0000 g/h | INTRAVENOUS | Status: DC

## 2017-10-07 MED ORDER — MAGNESIUM SULFATE BOLUS VIA INFUSION
4.0000 g | Freq: Once | INTRAVENOUS | Status: DC
  Filled 2017-10-07: qty 500

## 2017-10-07 MED ORDER — OXYCODONE-ACETAMINOPHEN 5-325 MG PO TABS: 2.0000 | ORAL_TABLET | ORAL | Status: DC | PRN

## 2017-10-07 MED ORDER — OXYTOCIN 40 UNITS IN LACTATED RINGERS INFUSION - SIMPLE MED
2.5000 [IU]/h | INTRAVENOUS | Status: DC
  Filled 2017-10-07: qty 1000

## 2017-10-07 MED ORDER — FENTANYL CITRATE (PF) 100 MCG/2ML IJ SOLN
50.0000 ug | INTRAMUSCULAR | Status: DC | PRN
  Administered 2017-10-08 (×3): 100 ug via INTRAVENOUS
  Filled 2017-10-07 (×3): qty 2

## 2017-10-07 MED ORDER — ACETAMINOPHEN 325 MG PO TABS
650.0000 mg | ORAL_TABLET | ORAL | Status: DC | PRN
  Administered 2017-10-08: 650 mg via ORAL
  Filled 2017-10-07: qty 2

## 2017-10-07 MED ORDER — MISOPROSTOL 200 MCG PO TABS
ORAL_TABLET | ORAL | Status: AC
  Filled 2017-10-07: qty 4

## 2017-10-07 MED ORDER — LABETALOL HCL 5 MG/ML IV SOLN
20.0000 mg | INTRAVENOUS | Status: DC | PRN
  Filled 2017-10-07: qty 8

## 2017-10-07 MED ORDER — OXYTOCIN 10 UNIT/ML IJ SOLN
INTRAMUSCULAR | Status: AC
  Filled 2017-10-07: qty 2

## 2017-10-07 MED ORDER — MISOPROSTOL 50MCG HALF TABLET
50.0000 ug | ORAL_TABLET | ORAL | Status: DC
  Administered 2017-10-07: 50 ug via VAGINAL
  Filled 2017-10-07: qty 1

## 2017-10-07 MED ORDER — OXYCODONE-ACETAMINOPHEN 5-325 MG PO TABS: 1.0000 | ORAL_TABLET | ORAL | Status: DC | PRN

## 2017-10-07 MED ORDER — OXYTOCIN BOLUS FROM INFUSION
500.0000 mL | Freq: Once | INTRAVENOUS | Status: AC
  Administered 2017-10-08: 500 mL via INTRAVENOUS

## 2017-10-07 MED ORDER — LACTATED RINGERS IV SOLN
INTRAVENOUS | Status: DC
  Administered 2017-10-07 – 2017-10-08 (×3): via INTRAVENOUS

## 2017-10-07 MED ORDER — MISOPROSTOL 25 MCG QUARTER TABLET
25.0000 ug | ORAL_TABLET | ORAL | Status: DC
  Administered 2017-10-07 – 2017-10-08 (×2): 25 ug via VAGINAL
  Filled 2017-10-07: qty 1

## 2017-10-07 MED ORDER — ONDANSETRON HCL 4 MG/2ML IJ SOLN
4.0000 mg | Freq: Four times a day (QID) | INTRAMUSCULAR | Status: DC | PRN
  Administered 2017-10-08: 4 mg via INTRAVENOUS
  Filled 2017-10-07 (×2): qty 2

## 2017-10-07 MED ORDER — LIDOCAINE HCL (PF) 1 % IJ SOLN
30.0000 mL | INTRAMUSCULAR | Status: DC | PRN
Start: 2017-10-07 — End: 2017-10-08

## 2017-10-07 MED ORDER — HYDRALAZINE HCL 20 MG/ML IJ SOLN: 5.0000 mg | INTRAMUSCULAR | Status: DC | PRN

## 2017-10-07 MED ORDER — AMMONIA AROMATIC IN INHA
RESPIRATORY_TRACT | Status: AC
  Filled 2017-10-07: qty 10

## 2017-10-07 MED ORDER — CLINDAMYCIN PHOSPHATE 900 MG/50ML IV SOLN
900.0000 mg | Freq: Three times a day (TID) | INTRAVENOUS | Status: DC
  Administered 2017-10-07 – 2017-10-08 (×3): 900 mg via INTRAVENOUS
  Filled 2017-10-07 (×7): qty 50

## 2017-10-07 NOTE — Progress Notes (Signed)
ROB-counseled at length on pre-eclampsia, evaluation and treatment sent to L&D for labs and probable induction of labor.

## 2017-10-07 NOTE — OB Triage Note (Signed)
Pt sent over from office with elevated BP. BP on admission was 132/94 and cycling every 15 min.Pt admits to having a mild headache that comes and goes. Pt denies VB, LOF and states +FM. Monitors applied and assessing.

## 2017-10-07 NOTE — H&P (Signed)
Obstetric History and Physical  Jessica Mcintosh is a 26 y.o. G1P0 with IUP at 102w0d presenting with elevated blood pressure, sent from the office for evaluation. Patient states she has been having  irregular, every 10 minutes contractions, none vaginal bleeding, intact membranes, with active fetal movement.    Prenatal Course Source of Care: [facility]  Pregnancy complications or risks:elvated BP-new onset  Prenatal labs and studies: ABO, Rh: --/--/O POS (12/20 1059) Antibody: NEG (12/20 1059) Rubella: 3.70 (06/29 1200) RPR: Nonreactive (12/20 0000)  HBsAg: Negative (06/29 1200)  HIV:   negative [id number] (12/12 0000) 1 hr Glucola  normal Genetic screening normal Anatomy US normal  Past Medical History:  Diagnosis Date  . Medical history non-contributory   . Ovarian cyst     Past Surgical History:  Procedure Laterality Date  . NO PAST SURGERIES      OB History  Gravida Para Term Preterm AB Living  1            SAB TAB Ectopic Multiple Live Births               # Outcome Date GA Lbr Len/2nd Weight Sex Delivery Anes PTL Lv  1 Current               Social History   Socioeconomic History  . Marital status: Single    Spouse name: None  . Number of children: None  . Years of education: None  . Highest education level: None  Social Needs  . Financial resource strain: None  . Food insecurity - worry: None  . Food insecurity - inability: None  . Transportation needs - medical: None  . Transportation needs - non-medical: None  Occupational History  . None  Tobacco Use  . Smoking status: Never Smoker  . Smokeless tobacco: Never Used  Substance and Sexual Activity  . Alcohol use: No  . Drug use: No  . Sexual activity: Yes    Birth control/protection: Pill  Other Topics Concern  . None  Social History Narrative  . None    Family History  Problem Relation Age of Onset  . Depression Mother   . Hypertension Mother   . Depression Maternal Aunt   . Hypertension  Maternal Aunt   . Cancer Paternal Uncle   . Lung cancer Paternal Uncle   . Breast cancer Maternal Grandmother   . Depression Maternal Grandmother   . Cancer Maternal Grandfather   . Lung cancer Maternal Grandfather   . Cancer Paternal Grandfather   . Skin cancer Paternal Grandfather     Medications Prior to Admission  Medication Sig Dispense Refill Last Dose  . Prenatal Vit-Fe Fumarate-FA (PRENATAL MULTIVITAMIN) TABS tablet Take 1 tablet by mouth daily at 12 noon.   10/06/2017 at Unknown time    Allergies  Allergen Reactions  . Aspirin Hives, Itching, Rash, Shortness Of Breath and Swelling  . Penicillins Hives and Rash    Review of Systems: Negative except for what is mentioned in HPI.  Physical Exam: BP (!) 141/90   Pulse (!) 118   Temp 98.4 F (36.9 C) (Oral)   Resp 16   Ht 5\' 1"  (1.549 m)   Wt 179 lb (81.2 kg)   LMP 01/21/2017 (Exact Date)   BMI 33.82 kg/m  GENERAL: Well-developed, well-nourished female in no acute distress.  LUNGS: Clear to auscultation bilaterally.  HEART: Regular rate and rhythm. ABDOMEN: Soft, nontender, nondistended, gravid. EXTREMITIES: Nontender, no edema, 2+ distal pulses. Cervical Exam: Dilation: 1  Effacement (%): 60 Cervical Position: Posterior Station: -3 Presentation: Vertex Exam by:: Judie PetitM Trogdon RN FHT:  Baseline rate 155 bpm   Variability moderate  Accelerations present   Decelerations none Contractions: Every 4-10 mins   Pertinent Labs/Studies:   Results for orders placed or performed during the hospital encounter of 10/07/17 (from the past 24 hour(s))  OB RESULTS CONSOLE RPR     Status: None   Collection Time: 10/07/17 12:00 AM  Result Value Ref Range   RPR Nonreactive   Type and screen     Status: None   Collection Time: 10/07/17 10:59 AM  Result Value Ref Range   ABO/RH(D) O POS    Antibody Screen NEG    Sample Expiration 10/10/2017   Basic metabolic panel     Status: Abnormal   Collection Time: 10/07/17 10:59 AM   Result Value Ref Range   Sodium 135 135 - 145 mmol/L   Potassium 3.5 3.5 - 5.1 mmol/L   Chloride 105 101 - 111 mmol/L   CO2 21 (L) 22 - 32 mmol/L   Glucose, Bld 71 65 - 99 mg/dL   BUN 5 (L) 6 - 20 mg/dL   Creatinine, Ser 1.610.49 0.44 - 1.00 mg/dL   Calcium 9.0 8.9 - 09.610.3 mg/dL   GFR calc non Af Amer >60 >60 mL/min   GFR calc Af Amer >60 >60 mL/min   Anion gap 9 5 - 15  CBC with Differential     Status: Abnormal   Collection Time: 10/07/17 10:59 AM  Result Value Ref Range   WBC 12.2 (H) 3.6 - 11.0 K/uL   RBC 4.42 3.80 - 5.20 MIL/uL   Hemoglobin 13.9 12.0 - 16.0 g/dL   HCT 04.540.1 40.935.0 - 81.147.0 %   MCV 90.8 80.0 - 100.0 fL   MCH 31.5 26.0 - 34.0 pg   MCHC 34.7 32.0 - 36.0 g/dL   RDW 91.412.9 78.211.5 - 95.614.5 %   Platelets 232 150 - 440 K/uL   Neutrophils Relative % 81 %   Neutro Abs 9.8 (H) 1.4 - 6.5 K/uL   Lymphocytes Relative 10 %   Lymphs Abs 1.3 1.0 - 3.6 K/uL   Monocytes Relative 8 %   Monocytes Absolute 1.0 (H) 0.2 - 0.9 K/uL   Eosinophils Relative 1 %   Eosinophils Absolute 0.1 0 - 0.7 K/uL   Basophils Relative 0 %   Basophils Absolute 0.0 0 - 0.1 K/uL  Hepatic function panel     Status: Abnormal   Collection Time: 10/07/17 10:59 AM  Result Value Ref Range   Total Protein 6.4 (L) 6.5 - 8.1 g/dL   Albumin 2.8 (L) 3.5 - 5.0 g/dL   AST 28 15 - 41 U/L   ALT 16 14 - 54 U/L   Alkaline Phosphatase 196 (H) 38 - 126 U/L   Total Bilirubin 0.5 0.3 - 1.2 mg/dL   Bilirubin, Direct <2.1<0.1 (L) 0.1 - 0.5 mg/dL   Indirect Bilirubin NOT CALCULATED 0.3 - 0.9 mg/dL  Protein / creatinine ratio, urine     Status: None   Collection Time: 10/07/17 11:14 AM  Result Value Ref Range   Creatinine, Urine 137 mg/dL   Total Protein, Urine 17 mg/dL   Protein Creatinine Ratio 0.12 0.00 - 0.15 mg/mg[Cre]    Assessment : Jessica Mcintosh is a 26 y.o. G1P0 at 7164w0d being admitted for pre-eclampsia and labor induction. MAD aware and has reviewed labs.  Plan: Labor:Induction per protocol, first dose cytotec  placed 2 hours  ago FWB: Reassuring fetal heart tracing.  GBS positive Delivery plan: Hopeful for vaginal delivery  Jessica Mcintosh, CNM [facility], [facility]

## 2017-10-07 NOTE — Progress Notes (Signed)
ROB- pt is having some headaches, having B feet swelling, BP is elevated some, pt is having a lot more pelvic pressure, and lower abd cramping

## 2017-10-07 NOTE — Patient Instructions (Signed)
Preeclampsia and Eclampsia °Preeclampsia is a serious condition that develops only during pregnancy. It is also called toxemia of pregnancy. This condition causes high blood pressure along with other symptoms, such as swelling and headaches. These symptoms may develop as the condition gets worse. Preeclampsia may occur at 20 weeks of pregnancy or later. °Diagnosing and treating preeclampsia early is very important. If not treated early, it can cause serious problems for you and your baby. One problem it can lead to is eclampsia, which is a condition that causes muscle jerking or shaking (convulsions or seizures) in the mother. Delivering your baby is the best treatment for preeclampsia or eclampsia. Preeclampsia and eclampsia symptoms usually go away after your baby is born. °What are the causes? °The cause of preeclampsia is not known. °What increases the risk? °The following risk factors make you more likely to develop preeclampsia: °· Being pregnant for the first time. °· Having had preeclampsia during a past pregnancy. °· Having a family history of preeclampsia. °· Having high blood pressure. °· Being pregnant with twins or triplets. °· Being 35 or older. °· Being African-American. °· Having kidney disease or diabetes. °· Having medical conditions such as lupus or blood diseases. °· Being very overweight (obese). ° °What are the signs or symptoms? °The earliest signs of preeclampsia are: °· High blood pressure. °· Increased protein in your urine. Your health care provider will check for this at every visit before you give birth (prenatal visit). ° °Other symptoms that may develop as the condition gets worse include: °· Severe headaches. °· Sudden weight gain. °· Swelling of the hands, face, legs, and feet. °· Nausea and vomiting. °· Vision problems, such as blurred or double vision. °· Numbness in the face, arms, legs, and feet. °· Urinating less than usual. °· Dizziness. °· Slurred speech. °· Abdominal pain,  especially upper abdominal pain. °· Convulsions or seizures. ° °Symptoms generally go away after giving birth. °How is this diagnosed? °There are no screening tests for preeclampsia. Your health care provider will ask you about symptoms and check for signs of preeclampsia during your prenatal visits. You may also have tests that include: °· Urine tests. °· Blood tests. °· Checking your blood pressure. °· Monitoring your baby’s heart rate. °· Ultrasound. ° °How is this treated? °You and your health care provider will determine the treatment approach that is best for you. Treatment may include: °· Having more frequent prenatal exams to check for signs of preeclampsia, if you have an increased risk for preeclampsia. °· Bed rest. °· Reducing how much salt (sodium) you eat. °· Medicine to lower your blood pressure. °· Staying in the hospital, if your condition is severe. There, treatment will focus on controlling your blood pressure and the amount of fluids in your body (fluid retention). °· You may need to take medicine (magnesium sulfate) to prevent seizures. This medicine may be given as an injection or through an IV tube. °· Delivering your baby early, if your condition gets worse. You may have your labor started with medicine (induced), or you may have a cesarean delivery. ° °Follow these instructions at home: °Eating and drinking ° °· Drink enough fluid to keep your urine clear or pale yellow. °· Eat a healthy diet that is low in sodium. Do not add salt to your food. Check nutrition labels to see how much sodium a food or beverage contains. °· Avoid caffeine. °Lifestyle °· Do not use any products that contain nicotine or tobacco, such as cigarettes   and e-cigarettes. If you need help quitting, ask your health care provider. °· Do not use alcohol or drugs. °· Avoid stress as much as possible. Rest and get plenty of sleep. °General instructions °· Take over-the-counter and prescription medicines only as told by your  health care provider. °· When lying down, lie on your side. This keeps pressure off of your baby. °· When sitting or lying down, raise (elevate) your feet. Try putting some pillows underneath your lower legs. °· Exercise regularly. Ask your health care provider what kinds of exercise are best for you. °· Keep all follow-up and prenatal visits as told by your health care provider. This is important. °How is this prevented? °To prevent preeclampsia or eclampsia from developing during another pregnancy: °· Get proper medical care during pregnancy. Your health care provider may be able to prevent preeclampsia or diagnose and treat it early. °· Your health care provider may have you take a low-dose aspirin or a calcium supplement during your next pregnancy. °· You may have tests of your blood pressure and kidney function after giving birth. °· Maintain a healthy weight. Ask your health care provider for help managing weight gain during pregnancy. °· Work with your health care provider to manage any long-term (chronic) health conditions you have, such as diabetes or kidney problems. ° °Contact a health care provider if: °· You gain more weight than expected. °· You have headaches. °· You have nausea or vomiting. °· You have abdominal pain. °· You feel dizzy or light-headed. °Get help right away if: °· You develop sudden or severe swelling anywhere in your body. This usually happens in the legs. °· You gain 5 lbs (2.3 kg) or more during one week. °· You have severe: °? Abdominal pain. °? Headaches. °? Dizziness. °? Vision problems. °? Confusion. °? Nausea or vomiting. °· You have a seizure. °· You have trouble moving any part of your body. °· You develop numbness in any part of your body. °· You have trouble speaking. °· You have any abnormal bleeding. °· You pass out. °This information is not intended to replace advice given to you by your health care provider. Make sure you discuss any questions you have with your health  care provider. °Document Released: 10/02/2000 Document Revised: 06/02/2016 Document Reviewed: 05/11/2016 °Elsevier Interactive Patient Education © 2018 [employer] ° °

## 2017-10-08 ENCOUNTER — Inpatient Hospital Stay: Payer: Medicaid Other | Admitting: Anesthesiology

## 2017-10-08 ENCOUNTER — Encounter: Payer: Self-pay | Admitting: Anesthesiology

## 2017-10-08 DIAGNOSIS — Z3A37 37 weeks gestation of pregnancy: Secondary | ICD-10-CM

## 2017-10-08 DIAGNOSIS — O1490 Unspecified pre-eclampsia, unspecified trimester: Secondary | ICD-10-CM

## 2017-10-08 DIAGNOSIS — O1404 Mild to moderate pre-eclampsia, complicating childbirth: Principal | ICD-10-CM

## 2017-10-08 LAB — RPR: RPR Ser Ql: NONREACTIVE

## 2017-10-08 LAB — CBC
HEMATOCRIT: 39.2 % (ref 35.0–47.0)
Hemoglobin: 13.3 g/dL (ref 12.0–16.0)
MCH: 30.5 pg (ref 26.0–34.0)
MCHC: 33.9 g/dL (ref 32.0–36.0)
MCV: 90 fL (ref 80.0–100.0)
PLATELETS: 199 10*3/uL (ref 150–440)
RBC: 4.35 MIL/uL (ref 3.80–5.20)
RDW: 12.9 % (ref 11.5–14.5)
WBC: 14.6 10*3/uL — AB (ref 3.6–11.0)

## 2017-10-08 MED ORDER — SODIUM CHLORIDE 0.9 % IV SOLN
INTRAVENOUS | Status: DC | PRN
  Administered 2017-10-08 (×2): 5 mL via EPIDURAL

## 2017-10-08 MED ORDER — TERBUTALINE SULFATE 1 MG/ML IJ SOLN: 0.2500 mg | Freq: Once | INTRAMUSCULAR | Status: DC | PRN

## 2017-10-08 MED ORDER — DIPHENHYDRAMINE HCL 50 MG/ML IJ SOLN: 12.5000 mg | INTRAMUSCULAR | Status: DC | PRN

## 2017-10-08 MED ORDER — MISOPROSTOL 200 MCG PO TABS
800.0000 ug | ORAL_TABLET | Freq: Once | ORAL | Status: AC
  Administered 2017-10-08: 800 ug via RECTAL

## 2017-10-08 MED ORDER — ONDANSETRON HCL 4 MG PO TABS: 4.0000 mg | ORAL_TABLET | ORAL | Status: DC | PRN

## 2017-10-08 MED ORDER — FENTANYL 2.5 MCG/ML W/ROPIVACAINE 0.15% IN NS 100 ML EPIDURAL (ARMC)
12.0000 mL/h | EPIDURAL | Status: DC
  Administered 2017-10-08: 12 mL/h via EPIDURAL

## 2017-10-08 MED ORDER — ONDANSETRON HCL 4 MG/2ML IJ SOLN: 4.0000 mg | INTRAMUSCULAR | Status: DC | PRN

## 2017-10-08 MED ORDER — OXYTOCIN 40 UNITS IN LACTATED RINGERS INFUSION - SIMPLE MED
1.0000 m[IU]/min | INTRAVENOUS | Status: DC
  Administered 2017-10-08: 2 m[IU]/min via INTRAVENOUS

## 2017-10-08 MED ORDER — OXYCODONE-ACETAMINOPHEN 5-325 MG PO TABS: 1.0000 | ORAL_TABLET | ORAL | Status: DC | PRN

## 2017-10-08 MED ORDER — EPHEDRINE 5 MG/ML INJ
10.0000 mg | INTRAVENOUS | Status: DC | PRN
  Filled 2017-10-08: qty 2

## 2017-10-08 MED ORDER — DIPHENHYDRAMINE HCL 25 MG PO CAPS: 25.0000 mg | ORAL_CAPSULE | Freq: Four times a day (QID) | ORAL | Status: DC | PRN

## 2017-10-08 MED ORDER — PHENYLEPHRINE 40 MCG/ML (10ML) SYRINGE FOR IV PUSH (FOR BLOOD PRESSURE SUPPORT)
80.0000 ug | PREFILLED_SYRINGE | INTRAVENOUS | Status: DC | PRN
  Filled 2017-10-08: qty 5

## 2017-10-08 MED ORDER — PRENATAL MULTIVITAMIN CH
1.0000 | ORAL_TABLET | Freq: Every day | ORAL | Status: DC
  Administered 2017-10-09 – 2017-10-10 (×2): 1 via ORAL
  Filled 2017-10-08 (×2): qty 1

## 2017-10-08 MED ORDER — IBUPROFEN 600 MG PO TABS
ORAL_TABLET | ORAL | Status: AC
  Filled 2017-10-08: qty 1

## 2017-10-08 MED ORDER — LIDOCAINE HCL (PF) 1 % IJ SOLN
INTRAMUSCULAR | Status: DC | PRN
  Administered 2017-10-08 (×2): 1 mL via INTRADERMAL

## 2017-10-08 MED ORDER — FENTANYL 2.5 MCG/ML W/ROPIVACAINE 0.15% IN NS 100 ML EPIDURAL (ARMC)
EPIDURAL | Status: AC
  Filled 2017-10-08: qty 100

## 2017-10-08 MED ORDER — SIMETHICONE 80 MG PO CHEW: 80.0000 mg | CHEWABLE_TABLET | ORAL | Status: DC | PRN

## 2017-10-08 MED ORDER — COCONUT OIL OIL
1.0000 "application " | TOPICAL_OIL | Status: DC | PRN
  Administered 2017-10-08: 1 via TOPICAL
  Filled 2017-10-08: qty 120

## 2017-10-08 MED ORDER — BENZOCAINE-MENTHOL 20-0.5 % EX AERO
1.0000 "application " | INHALATION_SPRAY | CUTANEOUS | Status: DC | PRN
  Administered 2017-10-08: 1 via TOPICAL
  Filled 2017-10-08: qty 56

## 2017-10-08 MED ORDER — LACTATED RINGERS IV SOLN: 500.0000 mL | Freq: Once | INTRAVENOUS | Status: DC

## 2017-10-08 MED ORDER — DIBUCAINE 1 % RE OINT: 1.0000 "application " | TOPICAL_OINTMENT | RECTAL | Status: DC | PRN

## 2017-10-08 MED ORDER — DOCUSATE SODIUM 100 MG PO CAPS
100.0000 mg | ORAL_CAPSULE | Freq: Two times a day (BID) | ORAL | Status: DC
  Administered 2017-10-09 – 2017-10-10 (×4): 100 mg via ORAL
  Filled 2017-10-08 (×4): qty 1

## 2017-10-08 MED ORDER — ACETAMINOPHEN 325 MG PO TABS
650.0000 mg | ORAL_TABLET | ORAL | Status: DC | PRN
  Administered 2017-10-09 – 2017-10-10 (×5): 650 mg via ORAL
  Filled 2017-10-08 (×5): qty 2

## 2017-10-08 MED ORDER — WITCH HAZEL-GLYCERIN EX PADS: 1.0000 "application " | MEDICATED_PAD | CUTANEOUS | Status: DC | PRN

## 2017-10-08 MED ORDER — LIDOCAINE-EPINEPHRINE (PF) 1.5 %-1:200000 IJ SOLN
INTRAMUSCULAR | Status: DC | PRN
  Administered 2017-10-08: 3 mL via EPIDURAL

## 2017-10-08 NOTE — Anesthesia Procedure Notes (Signed)
Epidural Patient location during procedure: OB Start time: 10/08/2017 12:20 PM End time: 10/08/2017 12:25 PM  Staffing Anesthesiologist: Piscitello, Cleda Mccreedy, MD Performed: anesthesiologist   Preanesthetic Checklist Completed: patient identified, site marked, surgical consent, pre-op evaluation, timeout performed, IV checked, risks and benefits discussed and monitors and equipment checked  Epidural Patient position: sitting Prep: Betadine Patient monitoring: heart rate, continuous pulse ox and blood pressure Approach: midline Location: L3-L4 Injection technique: LOR saline  Needle:  Needle type: Tuohy  Needle gauge: 17 G Needle length: 9 cm and 9 Needle insertion depth: 6.5 cm Catheter type: closed end flexible Catheter size: 19 Gauge Catheter at skin depth: 11.5 cm Test dose: negative and 1.5% lidocaine with Epi 1:200 K  Assessment Sensory level: T10 Events: blood not aspirated, injection not painful, no injection resistance, negative IV test and no paresthesia  Additional Notes 2 attempts  Pt. Evaluated and documentation done after procedure finished. Patient identified. Risks/Benefits/Options discussed with patient including but not limited to bleeding, infection, nerve damage, paralysis, failed block, incomplete pain control, headache, blood pressure changes, nausea, vomiting, reactions to medication both or allergic, itching and postpartum back pain. Confirmed with bedside nurse the patient's most recent platelet count. Confirmed with patient that they are not currently taking any anticoagulation, have any bleeding history or any family history of bleeding disorders. Patient expressed understanding and wished to proceed. All questions were answered. Sterile technique was used throughout the entire procedure. Please see nursing notes for vital signs. Test dose was given through epidural catheter and negative prior to continuing to dose epidural or start infusion. Warning  signs of high block given to the patient including shortness of breath, tingling/numbness in hands, complete motor block, or any concerning symptoms with instructions to call for help. Patient was given instructions on fall risk and not to get out of bed. All questions and concerns addressed with instructions to call with any issues or inadequate analgesia.   Patient tolerated the insertion well without immediate complications.Reason for block:procedure for pain

## 2017-10-08 NOTE — Progress Notes (Signed)
Jessica Mcintosh is a 26 y.o. G1P0 at [redacted]w[redacted]d by LMP admitted for induction of labor due to Pre-eclamptic toxemia of pregnancy..  Subjective:  Reports increased pressure with contractions, was sleeping when I entered room Objective: BP 132/66   Pulse 100   Temp 97.8 F (36.6 C) (Oral)   Resp 16   Ht 5\' 1"  (1.549 m)   Wt 179 lb (81.2 kg)   LMP 01/21/2017 (Exact Date)   BMI 33.82 kg/m  I/O last 3 completed shifts: In: -  Out: 1 [Urine:1] No intake/output data recorded.  FHT:  FHR: 148 bpm, variability: moderate,  accelerations:  Present,  decelerations:  Absent UC:   irregular, every 1-3 minutes SVE:   Dilation: 3 Effacement (%): 70, 80 Station: -2 Exam by:: Galen Manila CNM  Labs: Lab Results  Component Value Date   WBC 12.2 (H) 10/07/2017   HGB 13.9 10/07/2017   HCT 40.1 10/07/2017   MCV 90.8 10/07/2017   PLT 232 10/07/2017    Assessment / Plan: IOL, progressed well with cytotec, will switch to pitocin  Labor: Progressing normally Preeclampsia:  labs stable Fetal Wellbeing:  Category I Pain Control:  Labor support without medications I/D:  n/a Anticipated MOD:  NSVD  Melody N Shambley 10/08/2017, 8:27 AM

## 2017-10-08 NOTE — Progress Notes (Signed)
Jessica Mcintosh is a 26 y.o. G1P0 at [redacted]w[redacted]d by LMP admitted for induction of labor due to Pre-eclamptic toxemia of pregnancy..  Subjective:  Reports pressure in rectum, but no pain since epidural placed Objective: BP 121/70   Pulse (!) 115   Temp 98.8 F (37.1 C) (Oral)   Resp 16   Ht 5\' 1"  (1.549 m)   Wt 179 lb (81.2 kg)   LMP 01/21/2017 (Exact Date)   BMI 33.82 kg/m  I/O last 3 completed shifts: In: -  Out: 1 [Urine:1] Total I/O In: -  Out: 550 [Urine:550]  FHT:  FHR: 144 bpm, variability: moderate,  accelerations:  Present,  decelerations:  Absent UC:   regular, every 2-3 minutes SVE:   Dilation: 10 Effacement (%): 100 Station: +2 Exam by:: Galen Manila CNM  Labs: Lab Results  Component Value Date   WBC 14.6 (H) 10/08/2017   HGB 13.3 10/08/2017   HCT 39.2 10/08/2017   MCV 90.0 10/08/2017   PLT 199 10/08/2017    Assessment / Plan: Induction of labor due to preeclampsia,  progressing well on pitocin  Labor: Progressing normally Preeclampsia:  labs stable Fetal Wellbeing:  Category I Pain Control:  Epidural I/D:  n/a Anticipated MOD:  NSVD  Tracey Stewart N Shawniece Oyola 10/08/2017, 4:51 PM

## 2017-10-08 NOTE — Anesthesia Preprocedure Evaluation (Signed)
Anesthesia Evaluation  Patient identified by MRN, date of birth, ID band Patient awake    Reviewed: Allergy & Precautions, H&P , NPO status , Patient's Chart, lab work & pertinent test results  History of Anesthesia Complications Negative for: history of anesthetic complications  Airway Mallampati: III  TM Distance: >3 FB Neck ROM: full    Dental  (+) Chipped   Pulmonary neg pulmonary ROS, neg shortness of breath,           Cardiovascular Exercise Tolerance: Good hypertension, negative cardio ROS       Neuro/Psych    GI/Hepatic negative GI ROS,   Endo/Other    Renal/GU   negative genitourinary   Musculoskeletal   Abdominal   Peds  Hematology negative hematology ROS (+)   Anesthesia Other Findings PreE  Past Medical History: No date: Medical history non-contributory No date: Ovarian cyst  Past Surgical History: No date: NO PAST SURGERIES  BMI    Body Mass Index:  33.82 kg/m      Reproductive/Obstetrics (+) Pregnancy                             Anesthesia Physical Anesthesia Plan  ASA: III  Anesthesia Plan: Epidural   Post-op Pain Management:    Induction:   PONV Risk Score and Plan:   Airway Management Planned:   Additional Equipment:   Intra-op Plan:   Post-operative Plan:   Informed Consent: I have reviewed the patients History and Physical, chart, labs and discussed the procedure including the risks, benefits and alternatives for the proposed anesthesia with the patient or authorized representative who has indicated his/her understanding and acceptance.     Plan Discussed with: Anesthesiologist  Anesthesia Plan Comments:         Anesthesia Quick Evaluation

## 2017-10-09 LAB — CBC
HEMATOCRIT: 35.5 % (ref 35.0–47.0)
HEMOGLOBIN: 12.2 g/dL (ref 12.0–16.0)
MCH: 31.1 pg (ref 26.0–34.0)
MCHC: 34.4 g/dL (ref 32.0–36.0)
MCV: 90.6 fL (ref 80.0–100.0)
Platelets: 193 10*3/uL (ref 150–440)
RBC: 3.92 MIL/uL (ref 3.80–5.20)
RDW: 12.9 % (ref 11.5–14.5)
WBC: 17.5 10*3/uL — ABNORMAL HIGH (ref 3.6–11.0)

## 2017-10-09 NOTE — Anesthesia Postprocedure Evaluation (Signed)
Anesthesia Post Note  Patient: Jessica Mcintosh  Procedure(s) Performed: AN AD HOC LABOR EPIDURAL  Patient location during evaluation: Mother Baby Anesthesia Type: Epidural Level of consciousness: awake and alert and oriented Pain management: pain level controlled Vital Signs Assessment: post-procedure vital signs reviewed and stable Respiratory status: spontaneous breathing Cardiovascular status: blood pressure returned to baseline Anesthetic complications: no     Last Vitals:  Vitals:   10/09/17 0428 10/09/17 0712  BP: 132/73 128/76  Pulse: 99 95  Resp: 20 17  Temp: 37.1 C 36.8 C  SpO2: 98% 98%    Last Pain:  Vitals:   10/09/17 1045  TempSrc:   PainSc: 0-No pain                 Melda Mermelstein

## 2017-10-09 NOTE — Progress Notes (Signed)
Post Partum Day 1 Subjective: no complaints, up ad lib and voiding  Objective: Blood pressure 128/76, pulse 95, temperature 98.2 F (36.8 C), temperature source Oral, resp. rate 17, height 5\' 1"  (1.549 m), weight 179 lb (81.2 kg), last menstrual period 01/21/2017, SpO2 98 %, unknown if currently breastfeeding.  Physical Exam:  General: alert, cooperative, appears stated age and fatigued Lochia: appropriate Uterine Fundus: firm Incision: NA DVT Evaluation: No evidence of DVT seen on physical exam. No cords or calf tenderness. Calf/Ankle edema is present.  Recent Labs    10/08/17 1142 10/09/17 0509  HGB 13.3 12.2  HCT 39.2 35.5    Assessment/Plan: Plan for discharge tomorrow, Breastfeeding and Circumcision prior to discharge Infant feeding Breast;    LOS: 2 days   Melody N Shambley 10/09/2017, 10:19 AM

## 2017-10-10 MED ORDER — VITAMIN D3 125 MCG (5000 UT) PO CAPS: 1.0000 | ORAL_CAPSULE | Freq: Every day | ORAL | 2 refills | Status: DC

## 2017-10-10 NOTE — Progress Notes (Signed)
Discharge instructions given. Patient verbalizes understanding of teaching. Patient discharged home at 12:05.

## 2017-10-10 NOTE — Discharge Summary (Signed)
Physician Obstetric Discharge Summary  Patient ID: Jessica Mcintosh MRN: [id number] DOB/AGE: 1991/01/28 26 y.o.   Date of Admission: 10/07/2017  Date of Discharge:   Admitting Diagnosis: Induction of labor at [redacted]w[redacted]d  Secondary Diagnosis: Preeclampsia  Mode of Delivery: normal spontaneous vaginal delivery     Discharge Diagnosis: Preeclampsia (mild)   Intrapartum Procedures: GBS prophylaxis and pitocin augmentation   Post partum procedures: none  Complications: 2nd degree perineal laceration   Brief Hospital Course  Jessica Mcintosh is a G1P1001 who had a SVD on 10/08/17;   please refer to the delivey note.  Patient had an uncomplicated postpartum course.  By time of discharge on PPD#2, her pain was controlled on oral pain medications; she had appropriate lochia and was ambulating, voiding without difficulty and tolerating regular diet.  She was deemed stable for discharge to home.     Labs: CBC Latest Ref Rng & Units 10/09/2017 10/08/2017 10/07/2017  WBC 3.6 - 11.0 K/uL 17.5(H) 14.6(H) 12.2(H)  Hemoglobin 12.0 - 16.0 g/dL 16.9 67.8 93.8  Hematocrit 35.0 - 47.0 % 35.5 39.2 40.1  Platelets 150 - 440 K/uL 193 199 232   O POS  Physical exam:  Blood pressure 120/64, pulse 77, temperature 98.7 F (37.1 C), temperature source Oral, resp. rate 18, height 5\' 1"  (1.549 m), weight 179 lb (81.2 kg), last menstrual period 01/21/2017, SpO2 98 %, unknown if currently breastfeeding. General: alert and no distress Lochia: appropriate Abdomen: soft, NT Uterine Fundus: firm Extremities: No evidence of DVT seen on physical exam. No lower extremity edema.  Discharge Instructions: Per After Visit Summary. Activity: Advance as tolerated. Pelvic rest for 6 weeks.  Also refer to After Visit Summary Diet: Regular Medications: Allergies as of 10/10/2017      Reactions   Aspirin Hives, Itching, Rash, Shortness Of Breath, Swelling   Penicillins Hives, Rash      Medication List    TAKE these  medications   prenatal multivitamin Tabs tablet Take 1 tablet by mouth daily at 12 noon.   Vitamin D3 5000 units Caps Take 1 capsule (5,000 Units total) by mouth daily.      Outpatient follow up:  Postpartum contraception: abstinence  Discharged Condition: good  Discharged to: home   Newborn Data: Disposition:home with mother  Apgars: APGAR (1 MIN): 6   APGAR (5 MINS): 9   APGAR (10 MINS):    Baby Feeding: Breast  Jessica Mcintosh Jessica Mcintosh, CNM

## 2017-11-19 ENCOUNTER — Ambulatory Visit (INDEPENDENT_AMBULATORY_CARE_PROVIDER_SITE_OTHER): Payer: Medicaid Other | Admitting: Certified Nurse Midwife

## 2017-11-19 ENCOUNTER — Encounter: Payer: Self-pay | Admitting: Certified Nurse Midwife

## 2017-11-19 MED ORDER — NORETHIN ACE-ETH ESTRAD-FE 1-20 MG-MCG PO TABS: 1.0000 | ORAL_TABLET | Freq: Every day | ORAL | 4 refills | Status: DC

## 2017-11-19 NOTE — Patient Instructions (Signed)
Ethinyl Estradiol; Norethindrone Acetate; Ferrous fumarate tablets or capsules What is this medicine? ETHINYL ESTRADIOL; NORETHINDRONE ACETATE; FERROUS FUMARATE (ETH in il es tra DYE ole; nor eth IN drone AS e tate; FER us FUE ma rate) is an oral contraceptive. The products combine two types of female hormones, an estrogen and a progestin. They are used to prevent ovulation and pregnancy. Some products are also used to treat acne in females. This medicine may be used for other purposes; ask your health care provider or pharmacist if you have questions. COMMON BRAND NAME(S): Blisovi 24 Fe, Blisovi Fe, Estrostep Fe, Gildess 24 Fe, Gildess Fe 1.5/30, Gildess Fe 1/20, Junel Fe 1.5/30, Junel Fe 1/20, Junel Fe 24, Larin Fe, Lo Loestrin Fe, Loestrin 24 Fe, Loestrin FE 1.5/30, Loestrin FE 1/20, Lomedia 24 Fe, Microgestin 24 Fe, Microgestin Fe 1.5/30, Microgestin Fe 1/20, Tarina Fe 1/20, Taytulla, Tilia Fe, Tri-Legest Fe What should I tell my health care provider before I take this medicine? They need to know if you have any of these conditions: -abnormal vaginal bleeding -blood vessel disease -breast, cervical, endometrial, ovarian, liver, or uterine cancer -diabetes -gallbladder disease -heart disease or recent heart attack -high blood pressure -high cholesterol -history of blood clots -kidney disease -liver disease -migraine headaches -smoke tobacco -stroke -systemic lupus erythematosus (SLE) -an unusual or allergic reaction to estrogens, progestins, other medicines, foods, dyes, or preservatives -pregnant or trying to get pregnant -breast-feeding How should I use this medicine? Take this medicine by mouth. To reduce nausea, this medicine may be taken with food. Follow the directions on the prescription label. Take this medicine at the same time each day and in the order directed on the package. Do not take your medicine more often than directed. A patient package insert for the product will be  given with each prescription and refill. Read this sheet carefully each time. The sheet may change frequently. Contact your pediatrician regarding the use of this medicine in children. Special care may be needed. This medicine has been used in female children who have started having menstrual periods. Overdosage: If you think you have taken too much of this medicine contact a poison control center or emergency room at once. NOTE: This medicine is only for you. Do not share this medicine with others. What if I miss a dose? If you miss a dose, refer to the patient information sheet you received with your medicine for direction. If you miss more than one pill, this medicine may not be as effective and you may need to use another form of birth control. What may interact with this medicine? Do not take this medicine with the following medication: -dasabuvir; ombitasvir; paritaprevir; ritonavir -ombitasvir; paritaprevir; ritonavir This medicine may also interact with the following medications: -acetaminophen -antibiotics or medicines for infections, especially rifampin, rifabutin, rifapentine, and griseofulvin, and possibly penicillins or tetracyclines -aprepitant -ascorbic acid (vitamin C) -atorvastatin -barbiturate medicines, such as phenobarbital -bosentan -carbamazepine -caffeine -clofibrate -cyclosporine -dantrolene -doxercalciferol -felbamate -grapefruit juice -hydrocortisone -medicines for anxiety or sleeping problems, such as diazepam or temazepam -medicines for diabetes, including pioglitazone -mineral oil -modafinil -mycophenolate -nefazodone -oxcarbazepine -phenytoin -prednisolone -ritonavir or other medicines for HIV infection or AIDS -rosuvastatin -selegiline -soy isoflavones supplements -St. John's wort -tamoxifen or raloxifene -theophylline -thyroid hormones -topiramate -warfarin This list may not describe all possible interactions. Give your health care  provider a list of all the medicines, herbs, non-prescription drugs, or dietary supplements you use. Also tell them if you smoke, drink alcohol, or use illegal drugs. Some   items may interact with your medicine. What should I watch for while using this medicine? Visit your doctor or health care professional for regular checks on your progress. You will need a regular breast and pelvic exam and Pap smear while on this medicine. Use an additional method of contraception during the first cycle that you take these tablets. If you have any reason to think you are pregnant, stop taking this medicine right away and contact your doctor or health care professional. If you are taking this medicine for hormone related problems, it may take several cycles of use to see improvement in your condition. Smoking increases the risk of getting a blood clot or having a stroke while you are taking birth control pills, especially if you are more than 26 years old. You are strongly advised not to smoke. This medicine can make your body retain fluid, making your fingers, hands, or ankles swell. Your blood pressure can go up. Contact your doctor or health care professional if you feel you are retaining fluid. This medicine can make you more sensitive to the sun. Keep out of the sun. If you cannot avoid being in the sun, wear protective clothing and use sunscreen. Do not use sun lamps or tanning beds/booths. If you wear contact lenses and notice visual changes, or if the lenses begin to feel uncomfortable, consult your eye care specialist. In some women, tenderness, swelling, or minor bleeding of the gums may occur. Notify your dentist if this happens. Brushing and flossing your teeth regularly may help limit this. See your dentist regularly and inform your dentist of the medicines you are taking. If you are going to have elective surgery, you may need to stop taking this medicine before the surgery. Consult your health care  professional for advice. This medicine does not protect you against HIV infection (AIDS) or any other sexually transmitted diseases. What side effects may I notice from receiving this medicine? Side effects that you should report to your doctor or health care professional as soon as possible: -allergic reactions like skin rash, itching or hives, swelling of the face, lips, or tongue -breast tissue changes or discharge -changes in vaginal bleeding during your period or between your periods -changes in vision -chest pain -confusion -coughing up blood -dizziness -feeling faint or lightheaded -headaches or migraines -leg, arm or groin pain -loss of balance or coordination -severe or sudden headaches -stomach pain (severe) -sudden shortness of breath -sudden numbness or weakness of the face, arm or leg -symptoms of vaginal infection like itching, irritation or unusual discharge -tenderness in the upper abdomen -trouble speaking or understanding -vomiting -yellowing of the eyes or skin Side effects that usually do not require medical attention (report to your doctor or health care professional if they continue or are bothersome): -breakthrough bleeding and spotting that continues beyond the 3 initial cycles of pills -breast tenderness -mood changes, anxiety, depression, frustration, anger, or emotional outbursts -increased sensitivity to sun or ultraviolet light -nausea -skin rash, acne, or brown spots on the skin -weight gain (slight) This list may not describe all possible side effects. Call your doctor for medical advice about side effects. You may report side effects to FDA at 1-800-FDA-1088. Where should I keep my medicine? Keep out of the reach of children. Store at room temperature between 15 and 30 degrees C (59 and 86 degrees F). Throw away any unused medicine after the expiration date. NOTE: This sheet is a summary. It may not cover all possible information. If you   have  questions about this medicine, talk to your doctor, pharmacist, or health care provider.  2018 Elsevier/Gold Standard (2016-06-15 08:04:41) Preventive Care 18-39 Years, Female Preventive care refers to lifestyle choices and visits with your health care provider that can promote health and wellness. What does preventive care include?  A yearly physical exam. This is also called an annual well check.  Dental exams once or twice a year.  Routine eye exams. Ask your health care provider how often you should have your eyes checked.  Personal lifestyle choices, including: ? Daily care of your teeth and gums. ? Regular physical activity. ? Eating a healthy diet. ? Avoiding tobacco and drug use. ? Limiting alcohol use. ? Practicing safe sex. ? Taking vitamin and mineral supplements as recommended by your health care provider. What happens during an annual well check? The services and screenings done by your health care provider during your annual well check will depend on your age, overall health, lifestyle risk factors, and family history of disease. Counseling Your health care provider may ask you questions about your:  Alcohol use.  Tobacco use.  Drug use.  Emotional well-being.  Home and relationship well-being.  Sexual activity.  Eating habits.  Work and work environment.  Method of birth control.  Menstrual cycle.  Pregnancy history.  Screening You may have the following tests or measurements:  Height, weight, and BMI.  Diabetes screening. This is done by checking your blood sugar (glucose) after you have not eaten for a while (fasting).  Blood pressure.  Lipid and cholesterol levels. These may be checked every 5 years starting at age 20.  Skin check.  Hepatitis C blood test.  Hepatitis B blood test.  Sexually transmitted disease (STD) testing.  BRCA-related cancer screening. This may be done if you have a family history of breast, ovarian, tubal, or  peritoneal cancers.  Pelvic exam and Pap test. This may be done every 3 years starting at age 21. Starting at age 30, this may be done every 5 years if you have a Pap test in combination with an HPV test.  Discuss your test results, treatment options, and if necessary, the need for more tests with your health care provider. Vaccines Your health care provider may recommend certain vaccines, such as:  Influenza vaccine. This is recommended every year.  Tetanus, diphtheria, and acellular pertussis (Tdap, Td) vaccine. You may need a Td booster every 10 years.  Varicella vaccine. You may need this if you have not been vaccinated.  HPV vaccine. If you are 26 or younger, you may need three doses over 6 months.  Measles, mumps, and rubella (MMR) vaccine. You may need at least one dose of MMR. You may also need a second dose.  Pneumococcal 13-valent conjugate (PCV13) vaccine. You may need this if you have certain conditions and were not previously vaccinated.  Pneumococcal polysaccharide (PPSV23) vaccine. You may need one or two doses if you smoke cigarettes or if you have certain conditions.  Meningococcal vaccine. One dose is recommended if you are age 19-21 years and a first-year college student living in a residence hall, or if you have one of several medical conditions. You may also need additional booster doses.  Hepatitis A vaccine. You may need this if you have certain conditions or if you travel or work in places where you may be exposed to hepatitis A.  Hepatitis B vaccine. You may need this if you have certain conditions or if you travel or work in   places where you may be exposed to hepatitis B.  Haemophilus influenzae type b (Hib) vaccine. You may need this if you have certain risk factors.  Talk to your health care provider about which screenings and vaccines you need and how often you need them. This information is not intended to replace advice given to you by your health care  provider. Make sure you discuss any questions you have with your health care provider. Document Released: 12/01/2001 Document Revised: 06/24/2016 Document Reviewed: 08/06/2015 Elsevier Interactive Patient Education  2018 Elsevier Inc.  

## 2017-11-19 NOTE — Progress Notes (Signed)
Subjective:    Jessica Mcintosh is a 27 y.o. G40P1001 Caucasian female who presents for a postpartum visit. She is 6 weeks postpartum following a spontaneous vaginal delivery at 37+1 gestational weeks. Anesthesia: epidural. I have fully reviewed the prenatal and intrapartum course.   Postpartum course has been uncomplicated. Baby's course has been uncomplicated. Baby is feeding by formula. Bleeding no bleeding. Bowel function is normal. Bladder function is normal.   Patient is not sexually active. Last sexual activity: during pregnancy. Contraception method is abstinence. Postpartum depression screening: negative. Score 4.  Last pap 04/2017 and was normal.  The following portions of the patient's history were reviewed and updated as appropriate: allergies, current medications, past medical history, past surgical history and problem list.  Review of Systems Pertinent items are noted in HPI.   Objective:   BP 133/68   Pulse 67   Ht 5\' 1"  (1.549 m)   Wt 150 lb 12.8 oz (68.4 kg)   Breastfeeding? No   BMI 28.49 kg/m   General:  alert, cooperative and no distress   Breasts:  deferred, no complaints  Lungs: clear to auscultation bilaterally  Heart:  regular rate and rhythm  Abdomen: soft, nontender   Vulva: normal  Vagina: normal vagina  Cervix:  closed  Corpus: Well-involuted  Adnexa:  Non-palpable   Depression screen PHQ 2/9 11/19/2017  Decreased Interest 1  Down, Depressed, Hopeless 0  PHQ - 2 Score 1  Altered sleeping 1  Tired, decreased energy 1  Change in appetite 0  Feeling bad or failure about yourself  0  Trouble concentrating 1  Moving slowly or fidgety/restless 0  Suicidal thoughts 0  PHQ-9 Score 4  Difficult doing work/chores Somewhat difficult     Assessment:   Postpartum exam Six (6) wks s/p spontaneous vaginal birth Formula feeding Depression screening Contraception counseling   Plan:   May resume work/school without restriction.   Rx: Junel, see orders.    Reviewed red flag symptoms and when to call.   Follow up in: 6 months for Annual Exam or earlier if needed.   Gunnar Bulla, CNM [facility], [facility]

## 2017-11-20 ENCOUNTER — Encounter: Payer: Self-pay | Admitting: Certified Nurse Midwife

## 2018-04-07 ENCOUNTER — Ambulatory Visit (INDEPENDENT_AMBULATORY_CARE_PROVIDER_SITE_OTHER): Payer: Medicaid Other | Admitting: Certified Nurse Midwife

## 2018-04-07 VITALS — BP 101/75 | HR 106 | Ht 61.0 in | Wt 155.2 lb

## 2018-04-07 DIAGNOSIS — Z3041 Encounter for surveillance of contraceptive pills: Secondary | ICD-10-CM | POA: Diagnosis not present

## 2018-04-07 DIAGNOSIS — R4586 Emotional lability: Secondary | ICD-10-CM

## 2018-04-07 DIAGNOSIS — L709 Acne, unspecified: Secondary | ICD-10-CM

## 2018-04-07 MED ORDER — NORETHIN-ETH ESTRAD-FE BIPHAS 1 MG-10 MCG / 10 MCG PO TABS: 1.0000 | ORAL_TABLET | Freq: Every day | ORAL | 11 refills | Status: DC

## 2018-04-07 NOTE — Progress Notes (Signed)
Pt would like to switch OCPs. C/o breaking out, mood swings and she is unable to lose wt.

## 2018-04-07 NOTE — Patient Instructions (Signed)
Ethinyl Estradiol; Norethindrone Acetate; Ferrous fumarate tablets or capsules What is this medicine? ETHINYL ESTRADIOL; NORETHINDRONE ACETATE; FERROUS FUMARATE (ETH in il es tra DYE ole; nor eth IN drone AS e tate; FER us FUE ma rate) is an oral contraceptive. The products combine two types of female hormones, an estrogen and a progestin. They are used to prevent ovulation and pregnancy. Some products are also used to treat acne in females. This medicine may be used for other purposes; ask your health care provider or pharmacist if you have questions. COMMON BRAND NAME(S): Blisovi 24 Fe, Blisovi Fe, Estrostep Fe, Gildess 24 Fe, Gildess Fe 1.5/30, Gildess Fe 1/20, Junel Fe 1.5/30, Junel Fe 1/20, Junel Fe 24, Larin Fe, Lo Loestrin Fe, Loestrin 24 Fe, Loestrin FE 1.5/30, Loestrin FE 1/20, Lomedia 24 Fe, Microgestin 24 Fe, Microgestin Fe 1.5/30, Microgestin Fe 1/20, Tarina Fe 1/20, Taytulla, Tilia Fe, Tri-Legest Fe What should I tell my health care provider before I take this medicine? They need to know if you have any of these conditions: -abnormal vaginal bleeding -blood vessel disease -breast, cervical, endometrial, ovarian, liver, or uterine cancer -diabetes -gallbladder disease -heart disease or recent heart attack -high blood pressure -high cholesterol -history of blood clots -kidney disease -liver disease -migraine headaches -smoke tobacco -stroke -systemic lupus erythematosus (SLE) -an unusual or allergic reaction to estrogens, progestins, other medicines, foods, dyes, or preservatives -pregnant or trying to get pregnant -breast-feeding How should I use this medicine? Take this medicine by mouth. To reduce nausea, this medicine may be taken with food. Follow the directions on the prescription label. Take this medicine at the same time each day and in the order directed on the package. Do not take your medicine more often than directed. A patient package insert for the product will be  given with each prescription and refill. Read this sheet carefully each time. The sheet may change frequently. Contact your pediatrician regarding the use of this medicine in children. Special care may be needed. This medicine has been used in female children who have started having menstrual periods. Overdosage: If you think you have taken too much of this medicine contact a poison control center or emergency room at once. NOTE: This medicine is only for you. Do not share this medicine with others. What if I miss a dose? If you miss a dose, refer to the patient information sheet you received with your medicine for direction. If you miss more than one pill, this medicine may not be as effective and you may need to use another form of birth control. What may interact with this medicine? Do not take this medicine with the following medication: -dasabuvir; ombitasvir; paritaprevir; ritonavir -ombitasvir; paritaprevir; ritonavir This medicine may also interact with the following medications: -acetaminophen -antibiotics or medicines for infections, especially rifampin, rifabutin, rifapentine, and griseofulvin, and possibly penicillins or tetracyclines -aprepitant -ascorbic acid (vitamin C) -atorvastatin -barbiturate medicines, such as phenobarbital -bosentan -carbamazepine -caffeine -clofibrate -cyclosporine -dantrolene -doxercalciferol -felbamate -grapefruit juice -hydrocortisone -medicines for anxiety or sleeping problems, such as diazepam or temazepam -medicines for diabetes, including pioglitazone -mineral oil -modafinil -mycophenolate -nefazodone -oxcarbazepine -phenytoin -prednisolone -ritonavir or other medicines for HIV infection or AIDS -rosuvastatin -selegiline -soy isoflavones supplements -St. John's wort -tamoxifen or raloxifene -theophylline -thyroid hormones -topiramate -warfarin This list may not describe all possible interactions. Give your health care  provider a list of all the medicines, herbs, non-prescription drugs, or dietary supplements you use. Also tell them if you smoke, drink alcohol, or use illegal drugs. Some   items may interact with your medicine. What should I watch for while using this medicine? Visit your doctor or health care professional for regular checks on your progress. You will need a regular breast and pelvic exam and Pap smear while on this medicine. Use an additional method of contraception during the first cycle that you take these tablets. If you have any reason to think you are pregnant, stop taking this medicine right away and contact your doctor or health care professional. If you are taking this medicine for hormone related problems, it may take several cycles of use to see improvement in your condition. Smoking increases the risk of getting a blood clot or having a stroke while you are taking birth control pills, especially if you are more than 26 years old. You are strongly advised not to smoke. This medicine can make your body retain fluid, making your fingers, hands, or ankles swell. Your blood pressure can go up. Contact your doctor or health care professional if you feel you are retaining fluid. This medicine can make you more sensitive to the sun. Keep out of the sun. If you cannot avoid being in the sun, wear protective clothing and use sunscreen. Do not use sun lamps or tanning beds/booths. If you wear contact lenses and notice visual changes, or if the lenses begin to feel uncomfortable, consult your eye care specialist. In some women, tenderness, swelling, or minor bleeding of the gums may occur. Notify your dentist if this happens. Brushing and flossing your teeth regularly may help limit this. See your dentist regularly and inform your dentist of the medicines you are taking. If you are going to have elective surgery, you may need to stop taking this medicine before the surgery. Consult your health care  professional for advice. This medicine does not protect you against HIV infection (AIDS) or any other sexually transmitted diseases. What side effects may I notice from receiving this medicine? Side effects that you should report to your doctor or health care professional as soon as possible: -allergic reactions like skin rash, itching or hives, swelling of the face, lips, or tongue -breast tissue changes or discharge -changes in vaginal bleeding during your period or between your periods -changes in vision -chest pain -confusion -coughing up blood -dizziness -feeling faint or lightheaded -headaches or migraines -leg, arm or groin pain -loss of balance or coordination -severe or sudden headaches -stomach pain (severe) -sudden shortness of breath -sudden numbness or weakness of the face, arm or leg -symptoms of vaginal infection like itching, irritation or unusual discharge -tenderness in the upper abdomen -trouble speaking or understanding -vomiting -yellowing of the eyes or skin Side effects that usually do not require medical attention (report to your doctor or health care professional if they continue or are bothersome): -breakthrough bleeding and spotting that continues beyond the 3 initial cycles of pills -breast tenderness -mood changes, anxiety, depression, frustration, anger, or emotional outbursts -increased sensitivity to sun or ultraviolet light -nausea -skin rash, acne, or brown spots on the skin -weight gain (slight) This list may not describe all possible side effects. Call your doctor for medical advice about side effects. You may report side effects to FDA at 1-800-FDA-1088. Where should I keep my medicine? Keep out of the reach of children. Store at room temperature between 15 and 30 degrees C (59 and 86 degrees F). Throw away any unused medicine after the expiration date. NOTE: This sheet is a summary. It may not cover all possible information. If you   have  questions about this medicine, talk to your doctor, pharmacist, or health care provider.  2018 Elsevier/Gold Standard (2016-06-15 08:04:41)  

## 2018-04-08 NOTE — Progress Notes (Signed)
GYN ENCOUNTER NOTE  Subjective:       Jessica Mcintosh is a 27 y.o. G94P1001 female here for gynecologic evaluation of the following issues:  1. Increased acne and mood changes while taking OCPs  Patient reports symptoms since starting OCP. Spoke with friend who is "low dose birth control" and patient would like to try.   Denies difficulty breathing or respiratory distress, chest pain, abdominal pain, excessive vaginal bleeding, dysuria, and leg pain or swelling.    Gynecologic History  Patient's last menstrual period was 03/31/2018 (exact date).  Contraception: OCP (estrogen/progesterone), Junel 1/20  Last Pap: 04/19/2017. Results were: normal  Obstetric History  OB History  Gravida Para Term Preterm AB Living  1 1 1     1   SAB TAB Ectopic Multiple Live Births        0 1    # Outcome Date GA Lbr Len/2nd Weight Sex Delivery Anes PTL Lv  1 Term 10/08/17 [redacted]w[redacted]d 03:50 / 00:42 7 lb 4.8 oz (3.31 kg) M Vag-Spont EPI  LIV    Past Medical History:  Diagnosis Date  . Medical history non-contributory   . Ovarian cyst     Past Surgical History:  Procedure Laterality Date  . NO PAST SURGERIES      Current Outpatient Medications on File Prior to Visit  Medication Sig Dispense Refill  . Cholecalciferol (VITAMIN D3) 5000 units CAPS Take 1 capsule (5,000 Units total) by mouth daily. 120 capsule 2  . norethindrone-ethinyl estradiol (JUNEL FE 1/20) 1-20 MG-MCG tablet Take 1 tablet by mouth daily. 1 Package 4  . Prenatal Vit-Fe Fumarate-FA (PRENATAL MULTIVITAMIN) TABS tablet Take 1 tablet by mouth daily at 12 noon.     No current facility-administered medications on file prior to visit.     Allergies  Allergen Reactions  . Aspirin Hives, Itching, Rash, Shortness Of Breath and Swelling  . Penicillins Hives and Rash    Social History   Socioeconomic History  . Marital status: Single    Spouse name: Not on file  . Number of children: Not on file  . Years of education: Not on file   . Highest education level: Not on file  Occupational History  . Not on file  Social Needs  . Financial resource strain: Not on file  . Food insecurity:    Worry: Not on file    Inability: Not on file  . Transportation needs:    Medical: Not on file    Non-medical: Not on file  Tobacco Use  . Smoking status: Never Smoker  . Smokeless tobacco: Never Used  Substance and Sexual Activity  . Alcohol use: No  . Drug use: No  . Sexual activity: Not Currently    Birth control/protection: Pill  Lifestyle  . Physical activity:    Days per week: Not on file    Minutes per session: Not on file  . Stress: Not on file  Relationships  . Social connections:    Talks on phone: Not on file    Gets together: Not on file    Attends religious service: Not on file    Active member of club or organization: Not on file    Attends meetings of clubs or organizations: Not on file    Relationship status: Not on file  . Intimate partner violence:    Fear of current or ex partner: Not on file    Emotionally abused: Not on file    Physically abused: Not on file  Forced sexual activity: Not on file  Other Topics Concern  . Not on file  Social History Narrative  . Not on file    Family History  Problem Relation Age of Onset  . Depression Mother   . Hypertension Mother   . Depression Maternal Aunt   . Hypertension Maternal Aunt   . Cancer Paternal Uncle   . Lung cancer Paternal Uncle   . Breast cancer Maternal Grandmother   . Depression Maternal Grandmother   . Cancer Maternal Grandfather   . Lung cancer Maternal Grandfather   . Cancer Paternal Grandfather   . Skin cancer Paternal Grandfather     The following portions of the patient's history were reviewed and updated as appropriate: allergies, current medications, past family history, past medical history, past social history, past surgical history and problem list.  Review of Systems  ROS negative except as noted above. Information  obtained from patient.   Objective:   BP 101/75   Pulse (!) 106   Ht 5\' 1"  (1.549 m)   Wt 155 lb 4 oz (70.4 kg)   LMP 03/31/2018 (Exact Date)   Breastfeeding? No   BMI 29.33 kg/m   GENERAL: Alert and oriented x 4, no apparent distress.   Physical exam: not indicated.   Assessment:   1. Surveillance for birth control, oral contraceptives  2. Mood changes   3. Acne, unspecified acne type   Plan:   Rx: Lo Loestrin, see orders.   Reviewed red flag symptoms and when to call.   RTC as needed.    Gunnar Bulla, CNM [facility], [facility]

## 2018-05-27 ENCOUNTER — Ambulatory Visit (INDEPENDENT_AMBULATORY_CARE_PROVIDER_SITE_OTHER): Payer: Medicaid Other | Admitting: Certified Nurse Midwife

## 2018-05-27 ENCOUNTER — Encounter: Payer: Self-pay | Admitting: Certified Nurse Midwife

## 2018-05-27 ENCOUNTER — Encounter: Payer: Medicaid Other | Admitting: Certified Nurse Midwife

## 2018-05-27 VITALS — BP 96/60 | HR 77 | Ht 61.0 in | Wt 160.0 lb

## 2018-05-27 DIAGNOSIS — N941 Unspecified dyspareunia: Secondary | ICD-10-CM | POA: Diagnosis not present

## 2018-05-27 DIAGNOSIS — Z6830 Body mass index (BMI) 30.0-30.9, adult: Secondary | ICD-10-CM

## 2018-05-27 DIAGNOSIS — Z01419 Encounter for gynecological examination (general) (routine) without abnormal findings: Secondary | ICD-10-CM

## 2018-05-27 DIAGNOSIS — Z0000 Encounter for general adult medical examination without abnormal findings: Secondary | ICD-10-CM

## 2018-05-27 MED ORDER — NORETHIN-ETH ESTRAD-FE BIPHAS 1 MG-10 MCG / 10 MCG PO TABS: 1.0000 | ORAL_TABLET | Freq: Every day | ORAL | 11 refills | Status: DC

## 2018-05-27 NOTE — Progress Notes (Signed)
ANNUAL PREVENTATIVE CARE GYN  ENCOUNTER NOTE  Subjective:       Jessica Mcintosh is a 27 y.o. G24P1001 female here for a routine annual gynecologic exam.  Current complaints: 1. Needs Lo Loestrin refill 2. Dyspareunia since childbirth  Denies difficulty breathing or respiratory distress, chest pain, abdominal pain, excessive vaginal bleeding, dysuria, and leg pain or swelling.    Gynecologic History  Patient's last menstrual period was 05/04/2018 (exact date).  Contraception: OCP (estrogen/progesterone)  Last Pap: 0702/2018. Results were: normal  Obstetric History  OB History  Gravida Para Term Preterm AB Living  1 1 1     1   SAB TAB Ectopic Multiple Live Births        0 1    # Outcome Date GA Lbr Len/2nd Weight Sex Delivery Anes PTL Lv  1 Term 10/08/17 [redacted]w[redacted]d 03:50 / 00:42 7 lb 4.8 oz (3.31 kg) M Vag-Spont EPI  LIV    Past Medical History:  Diagnosis Date  . Ovarian cyst     Past Surgical History:  Procedure Laterality Date  . NO PAST SURGERIES      Current Outpatient Medications on File Prior to Visit  Medication Sig Dispense Refill  . Prenatal Vit-Fe Fumarate-FA (PRENATAL MULTIVITAMIN) TABS tablet Take 1 tablet by mouth daily at 12 noon.     No current facility-administered medications on file prior to visit.     Allergies  Allergen Reactions  . Aspirin Hives, Itching, Rash, Shortness Of Breath and Swelling  . Penicillins Hives and Rash    Social History   Socioeconomic History  . Marital status: Single    Spouse name: Not on file  . Number of children: Not on file  . Years of education: Not on file  . Highest education level: Not on file  Occupational History  . Not on file  Social Needs  . Financial resource strain: Not on file  . Food insecurity:    Worry: Not on file    Inability: Not on file  . Transportation needs:    Medical: Not on file    Non-medical: Not on file  Tobacco Use  . Smoking status: Never Smoker  . Smokeless tobacco: Never  Used  Substance and Sexual Activity  . Alcohol use: No  . Drug use: No  . Sexual activity: Yes    Birth control/protection: Pill  Lifestyle  . Physical activity:    Days per week: Not on file    Minutes per session: Not on file  . Stress: Not on file  Relationships  . Social connections:    Talks on phone: Not on file    Gets together: Not on file    Attends religious service: Not on file    Active member of club or organization: Not on file    Attends meetings of clubs or organizations: Not on file    Relationship status: Not on file  . Intimate partner violence:    Fear of current or ex partner: Not on file    Emotionally abused: Not on file    Physically abused: Not on file    Forced sexual activity: Not on file  Other Topics Concern  . Not on file  Social History Narrative  . Not on file    Family History  Problem Relation Age of Onset  . Depression Mother   . Hypertension Mother   . Depression Maternal Aunt   . Hypertension Maternal Aunt   . Cancer Paternal Uncle   .  Lung cancer Paternal Uncle   . Breast cancer Maternal Grandmother   . Depression Maternal Grandmother   . Cancer Maternal Grandfather   . Lung cancer Maternal Grandfather   . Cancer Paternal Grandfather   . Skin cancer Paternal Grandfather     The following portions of the patient's history were reviewed and updated as appropriate: allergies, current medications, past family history, past medical history, past social history, past surgical history and problem list.  Review of Systems  ROS negative except as noted above. Information obtained from patient.    Objective:   BP 96/60   Pulse 77   Ht 5\' 1"  (1.549 m)   Wt 160 lb (72.6 kg)   LMP 05/04/2018 (Exact Date)   Breastfeeding? No   BMI 30.23 kg/m    CONSTITUTIONAL: Well-developed, well-nourished female in no acute distress.   PSYCHIATRIC: Normal mood and affect. Normal behavior. Normal judgment and thought content.  NEUROLGIC:  Alert and oriented to person, place, and time. Normal muscle tone coordination. No cranial nerve deficit noted.  HENT:  Normocephalic, atraumatic, External right and left ear normal.  EYES: Conjunctivae and EOM are normal. Pupils are equal and round.    NECK: Normal range of motion, supple, no masses.  Normal thyroid.   SKIN: Skin is warm and dry. No rash noted. Not diaphoretic. No erythema. No pallor.  CARDIOVASCULAR: Normal heart rate noted, regular  rhythm, no murmur.  RESPIRATORY: Clear to auscultation bilaterally. Effort and breath sounds normal, no problems with respiration noted.  BREASTS: Symmetric in size. No masses, skin changes, nipple drainage, or lymphadenopathy.  ABDOMEN: Soft, normal bowel sounds, no distention noted.  No tenderness, rebound or guarding. Obese.   PELVIC:  External Genitalia: Normal  Vagina: Normal  Cervix: Normal  Uterus: Normal  Adnexa: Normal   MUSCULOSKELETAL: Normal range of motion. No tenderness.  No cyanosis, clubbing, or edema.  2+ distal pulses.  LYMPHATIC: No Axillary, Supraclavicular, or Inguinal Adenopathy.  Assessment:   Annual gynecologic examination 27 y.o.   Contraception: OCP (estrogen/progesterone)   Obesity 1   Problem List Items Addressed This Visit    None    Visit Diagnoses    Well woman exam    -  Primary   Relevant Orders   CBC   Thyroid Panel With TSH   Comprehensive metabolic panel   Hemoglobin A1c   NuSwab Vaginitis (VG)   BMI 30.0-30.9,adult       Relevant Orders   Thyroid Panel With TSH   Hemoglobin A1c   Dyspareunia in female       Relevant Orders   NuSwab Vaginitis (VG)      Plan:   Pap: Not needed  Labs: See orders  Routine preventative health maintenance measures emphasized: Exercise/Diet/Weight control, Tobacco Warnings, Alcohol/Substance use risks and Stress Management; See AVS  Reviewed red flag symptoms and when to call  RTC x 1 year for Annual Exam or sooner if needed   Gunnar Bulla, CNM [facility], [facility]

## 2018-05-27 NOTE — Patient Instructions (Addendum)
Ethinyl Estradiol; Norelgestromin skin patches What is this medicine? ETHINYL ESTRADIOL;NORELGESTROMIN (ETH in il es tra DYE ole; nor el JES troe min) skin patch is used as a contraceptive (birth control method). This medicine combines two types of female hormones, an estrogen and a progestin. This patch is used to prevent ovulation and pregnancy. This medicine may be used for other purposes; ask your health care provider or pharmacist if you have questions. COMMON BRAND NAME(S): Ortho Becky Sax What should I tell my health care provider before I take this medicine? They need to know if you have or ever had any of these conditions: -abnormal vaginal bleeding -blood vessel disease or blood clots -breast, cervical, endometrial, ovarian, liver, or uterine cancer -diabetes -gallbladder disease -heart disease or recent heart attack -high blood pressure -high cholesterol -kidney disease -liver disease -migraine headaches -stroke -systemic lupus erythematosus (SLE) -tobacco smoker -an unusual or allergic reaction to estrogens, progestins, other medicines, foods, dyes, or preservatives -pregnant or trying to get pregnant -breast-feeding How should I use this medicine? This patch is applied to the skin. Follow the directions on the prescription label. Apply to clean, dry, healthy skin on the buttock, abdomen, upper outer arm or upper torso, in a place where it will not be rubbed by tight clothing. Do not use lotions or other cosmetics on the site where the patch will go. Press the patch firmly in place for 10 seconds to ensure good contact with the skin. Change the patch every 7 days on the same day of the week for 3 weeks. You will then have a break from the patch for 1 week, after which you will apply a new patch. Do not use your medicine more often than directed. Contact your pediatrician regarding the use of this medicine in children. Special care may be needed. This medicine has been used in  female children who have started having menstrual periods. A patient package insert for the product will be given with each prescription and refill. Read this sheet carefully each time. The sheet may change frequently. Overdosage: If you think you have taken too much of this medicine contact a poison control center or emergency room at once. NOTE: This medicine is only for you. Do not share this medicine with others. What if I miss a dose? You will need to replace your patch once a week as directed. If your patch is lost or falls off, contact your health care professional for advice. You may need to use another form of birth control if your patch has been off for more than 1 day. What may interact with this medicine? Do not take this medicine with the following medication: -dasabuvir; ombitasvir; paritaprevir; ritonavir -ombitasvir; paritaprevir; ritonavir This medicine may also interact with the following medications: -acetaminophen -antibiotics or medicines for infections, especially rifampin, rifabutin, rifapentine, and griseofulvin, and possibly penicillins or tetracyclines -aprepitant -ascorbic acid (vitamin C) -atorvastatin -barbiturate medicines, such as phenobarbital -bosentan -carbamazepine -caffeine -clofibrate -cyclosporine -dantrolene -doxercalciferol -felbamate -grapefruit juice -hydrocortisone -medicines for anxiety or sleeping problems, such as diazepam or temazepam -medicines for diabetes, including pioglitazone -modafinil -mycophenolate -nefazodone -oxcarbazepine -phenytoin -prednisolone -ritonavir or other medicines for HIV infection or AIDS -rosuvastatin -selegiline -soy isoflavones supplements -St. John's wort -tamoxifen or raloxifene -theophylline -thyroid hormones -topiramate -warfarin This list may not describe all possible interactions. Give your health care provider a list of all the medicines, herbs, non-prescription drugs, or dietary supplements  you use. Also tell them if you smoke, drink alcohol, or use illegal drugs.  Some items may interact with your medicine. What should I watch for while using this medicine? Visit your doctor or health care professional for regular checks on your progress. You will need a regular breast and pelvic exam and Pap smear while on this medicine. Use an additional method of contraception during the first cycle that you use this patch. If you have any reason to think you are pregnant, stop using this medicine right away and contact your doctor or health care professional. If you are using this medicine for hormone related problems, it may take several cycles of use to see improvement in your condition. Smoking increases the risk of getting a blood clot or having a stroke while you are using hormonal birth control, especially if you are more than 26 years old. You are strongly advised not to smoke. This medicine can make your body retain fluid, making your fingers, hands, or ankles swell. Your blood pressure can go up. Contact your doctor or health care professional if you feel you are retaining fluid. This medicine can make you more sensitive to the sun. Keep out of the sun. If you cannot avoid being in the sun, wear protective clothing and use sunscreen. Do not use sun lamps or tanning beds/booths. If you wear contact lenses and notice visual changes, or if the lenses begin to feel uncomfortable, consult your eye care specialist. In some women, tenderness, swelling, or minor bleeding of the gums may occur. Notify your dentist if this happens. Brushing and flossing your teeth regularly may help limit this. See your dentist regularly and inform your dentist of the medicines you are taking. If you are going to have elective surgery or a MRI, you may need to stop using this medicine before the surgery or MRI. Consult your health care professional for advice. This medicine does not protect you against HIV infection  (AIDS) or any other sexually transmitted diseases. What side effects may I notice from receiving this medicine? Side effects that you should report to your doctor or health care professional as soon as possible: -breast tissue changes or discharge -changes in vaginal bleeding during your period or between your periods -chest pain -coughing up blood -dizziness or fainting spells -headaches or migraines -leg, arm or groin pain -severe or sudden headaches -stomach pain (severe) -sudden shortness of breath -sudden loss of coordination, especially on one side of the body -speech problems -symptoms of vaginal infection like itching, irritation or unusual discharge -tenderness in the upper abdomen -vomiting -weakness or numbness in the arms or legs, especially on one side of the body -yellowing of the eyes or skin Side effects that usually do not require medical attention (report to your doctor or health care professional if they continue or are bothersome): -breakthrough bleeding and spotting that continues beyond the 3 initial cycles of pills -breast tenderness -mood changes, anxiety, depression, frustration, anger, or emotional outbursts -increased sensitivity to sun or ultraviolet light -nausea -skin rash, acne, or brown spots on the skin -weight gain (slight) This list may not describe all possible side effects. Call your doctor for medical advice about side effects. You may report side effects to FDA at 1-800-FDA-1088. Where should I keep my medicine? Keep out of the reach of children. Store at room temperature between 15 and 30 degrees C (59 and 86 degrees F). Keep the patch in its pouch until time of use. Throw away any unused medicine after the expiration date. Dispose of used patches properly. Since a used patch may  still contain active hormones, fold the patch in half so that it sticks to itself prior to disposal. Throw away in a place where children or pets cannot reach. NOTE:  This sheet is a summary. It may not cover all possible information. If you have questions about this medicine, talk to your doctor, pharmacist, or health care provider.  2018 Elsevier/Gold Standard (2016-06-15 07:59:03) Dyspareunia, Female Dyspareunia is pain that is associated with sexual activity. This can affect any part of the genitals or lower abdomen, and there are many possible causes. This condition ranges from mild to severe. Depending on the cause, dyspareunia may get better with treatment, or it may return (recur) over time. What are the causes? The cause of this condition is not always known. Possible causes include:  Cancer.  Psychological factors, such as depression, anxiety, or previous traumatic experiences.  Severe pain and tenderness of the skin around the vagina (vulva) when it is touched (vulvar vestibulitis syndrome).  Infection of the pelvis or the vulva.  Infection of the vagina.  Painful, involuntary tightening (contraction) of the vaginal muscles when anything is put inside the vagina (vaginismus).  Allergic reaction.  Ovarian cysts.  Solid growths of tissue (tumors) in the ovaries or the uterus.  Scar tissue in the ovaries, vagina, or pelvis.  Vaginal dryness.  Thinning of the tissue (atrophy) of the vulva and vagina.  Skin conditions that affect the vulva (vulvar dermatoses), such as lichen sclerosus or lichen planus.  Endometriosis.  Tubal pregnancy.  A tilted uterus.  Uterine prolapse.  Adhesions in the vagina.  Bladder problems.  Intestinal problems.  Certain medicines.  Medical conditions such as diabetes, arthritis, or thyroid disease.  What increases the risk? The following factors may make you more likely to develop this condition:  Having experienced physical or sexual trauma.  Having given birth more than once.  Taking birth control pills.  Having gone through menopause.  Having recently given birth, typically within  the past 3-6 months.  Breastfeeding.  What are the signs or symptoms? The main symptom of this condition is pain in any part of the genitals or lower abdomen during or after sexual activity. This may include pain during sexual arousal, genital stimulation, or orgasm. Pain may get worse when anything is inserted into the vagina, or when the genitals are touched in any way, such as when sitting or wearing pants. Pain can range from mild to severe, depending on the cause of the condition. In some cases, symptoms go away with treatment and return (recur) at a later date. How is this diagnosed? This condition may be diagnosed based on:  Your symptoms, including: ? Where your pain is located. ? When your pain occurs.  Your medical history.  A physical exam. This may include a pelvic exam and a Pap test. This is a screening test that is used to check for signs of cancer of the vagina, cervix, and uterus.  Tests, including: ? Blood tests. ? Ultrasound. This uses sound waves to make a picture of the area that is being tested. ? Urine culture. This test involves checking a urine sample for signs of infection. ? Culture test. This is when your health care provider uses a swab to collect a sample of vaginal fluid. The sample is checked for signs of infection. ? X-rays. ? MRI. ? CT scan. ? Laparoscopy. This is a procedure in which a small incision is made in your lower abdomen and a lighted, pencil-sized instrument (laparoscope) is passed  through the incision and used to look inside your pelvis.  You may be referred to a health care provider who specializes in women's health (gynecologist). In some cases, diagnosing the cause of dyspareunia can be difficult. How is this treated? Treatment depends on the cause of your condition and your symptoms. In most cases, you may need to stop sexual activity until your symptoms improve. Treatment may include:  Lubricants.  Kegel exercises or vaginal  dilators.  Medicated skin creams.  Medicated vaginal creams.  Hormonal therapy.  Antibiotic medicine to prevent or fight infection.  Medicines that help to relieve pain.  Medicines that treat depression (antidepressants).  Psychological counseling.  Sex therapy.  Surgery.  Follow these instructions at home: Lifestyle  Avoid tight clothing and irritating materials around your genital and abdominal area.  Use water-based lubricants as needed. Avoid oil-based lubricants.  Do not use any products that irritate you. This may include certain condoms, spermicides, lubricants, soaps, tampons, vaginal sprays, or douches.  Always practice safe sex. Talk with your health care provider about which form of birth control (contraception) is best for you.  Maintain open communication with your sexual partner. General instructions  Take over-the-counter and prescription medicines only as told by your health care provider.  If you had tests done, it is your responsibility to get your tests results. Ask your health care provider or the department performing the test when your results will be ready.  Urinate before you engage in sexual activity.  Consider joining a support group.  Keep all follow-up visits as told by your health care provider. This is important. Contact a health care provider if:  You develop vaginal bleeding after sexual intercourse.  You develop a lump at the opening of your vagina. Seek medical care even if the lump is painless.  You have: ? Abnormal vaginal discharge. ? Vaginal dryness. ? Itchiness or irritation of your vulva or vagina. ? A new rash. ? Symptoms that get worse or do not improve with treatment. ? A fever. ? Pain when you urinate. ? Blood in your urine. Get help right away if:  You develop severe pain in your abdomen during or shortly after sexual intercourse.  You pass out after having sexual intercourse. This information is not intended  to replace advice given to you by your health care provider. Make sure you discuss any questions you have with your health care provider. Document Released: 10/25/2007 Document Revised: 02/14/2016 Document Reviewed: 05/07/2015 Elsevier Interactive Patient Education  2018 [employer]. Ethinyl Estradiol; Norethindrone Acetate; Ferrous fumarate tablets or capsules What is this medicine? ETHINYL ESTRADIOL; NORETHINDRONE ACETATE; FERROUS FUMARATE (ETH in il es tra DYE ole; nor eth IN drone AS e tate; FER [place] FUE ma rate) is an oral contraceptive. The products combine two types of female hormones, an estrogen and a progestin. They are used to prevent ovulation and pregnancy. Some products are also used to treat acne in females. This medicine may be used for other purposes; ask your health care provider or pharmacist if you have questions. COMMON BRAND NAME(S): [place] [address] Blisovi Fe, Estrostep Fe, Gildess [address] Gildess Fe 1.5/30, Gildess Fe 1/20, Junel Fe 1.5/30, Junel Fe 1/20, Junel Fe 24, Larin Fe, Lo Loestrin Fe, Loestrin 24 Fe, Loestrin FE 1.5/30, Loestrin FE 1/20, Lomedia 24 Fe, Microgestin 24 Fe, Microgestin Fe 1.5/30, Microgestin Fe 1/20, Tarina Fe 1/20, Taytulla, Tilia Fe, Tri-Legest Fe What should I tell my health care provider before I take this medicine? They need  to know if you have any of these conditions: -abnormal vaginal bleeding -blood vessel disease -breast, cervical, endometrial, ovarian, liver, or uterine cancer -diabetes -gallbladder disease -heart disease or recent heart attack -high blood pressure -high cholesterol -history of blood clots -kidney disease -liver disease -migraine headaches -smoke tobacco -stroke -systemic lupus erythematosus (SLE) -an unusual or allergic reaction to estrogens, progestins, other medicines, foods, dyes, or preservatives -pregnant or trying to get pregnant -breast-feeding How should I use this medicine? Take this medicine by mouth. To  reduce nausea, this medicine may be taken with food. Follow the directions on the prescription label. Take this medicine at the same time each day and in the order directed on the package. Do not take your medicine more often than directed. A patient package insert for the product will be given with each prescription and refill. Read this sheet carefully each time. The sheet may change frequently. Contact your pediatrician regarding the use of this medicine in children. Special care may be needed. This medicine has been used in female children who have started having menstrual periods. Overdosage: If you think you have taken too much of this medicine contact a poison control center or emergency room at once. NOTE: This medicine is only for you. Do not share this medicine with others. What if I miss a dose? If you miss a dose, refer to the patient information sheet you received with your medicine for direction. If you miss more than one pill, this medicine may not be as effective and you may need to use another form of birth control. What may interact with this medicine? Do not take this medicine with the following medication: -dasabuvir; ombitasvir; paritaprevir; ritonavir -ombitasvir; paritaprevir; ritonavir This medicine may also interact with the following medications: -acetaminophen -antibiotics or medicines for infections, especially rifampin, rifabutin, rifapentine, and griseofulvin, and possibly penicillins or tetracyclines -aprepitant -ascorbic acid (vitamin C) -atorvastatin -barbiturate medicines, such as phenobarbital -bosentan -carbamazepine -caffeine -clofibrate -cyclosporine -dantrolene -doxercalciferol -felbamate -grapefruit juice -hydrocortisone -medicines for anxiety or sleeping problems, such as diazepam or temazepam -medicines for diabetes, including pioglitazone -mineral  oil -modafinil -mycophenolate -nefazodone -oxcarbazepine -phenytoin -prednisolone -ritonavir or other medicines for HIV infection or AIDS -rosuvastatin -selegiline -soy isoflavones supplements -St. John's wort -tamoxifen or raloxifene -theophylline -thyroid hormones -topiramate -warfarin This list may not describe all possible interactions. Give your health care provider a list of all the medicines, herbs, non-prescription drugs, or dietary supplements you use. Also tell them if you smoke, drink alcohol, or use illegal drugs. Some items may interact with your medicine. What should I watch for while using this medicine? Visit your doctor or health care professional for regular checks on your progress. You will need a regular breast and pelvic exam and Pap smear while on this medicine. Use an additional method of contraception during the first cycle that you take these tablets. If you have any reason to think you are pregnant, stop taking this medicine right away and contact your doctor or health care professional. If you are taking this medicine for hormone related problems, it may take several cycles of use to see improvement in your condition. Smoking increases the risk of getting a blood clot or having a stroke while you are taking birth control pills, especially if you are more than 26 years old. You are strongly advised not to smoke. This medicine can make your body retain fluid, making your fingers, hands, or ankles swell. Your blood pressure can go up. Contact your doctor or health care  professional if you feel you are retaining fluid. This medicine can make you more sensitive to the sun. Keep out of the sun. If you cannot avoid being in the sun, wear protective clothing and use sunscreen. Do not use sun lamps or tanning beds/booths. If you wear contact lenses and notice visual changes, or if the lenses begin to feel uncomfortable, consult your eye care specialist. In some women,  tenderness, swelling, or minor bleeding of the gums may occur. Notify your dentist if this happens. Brushing and flossing your teeth regularly may help limit this. See your dentist regularly and inform your dentist of the medicines you are taking. If you are going to have elective surgery, you may need to stop taking this medicine before the surgery. Consult your health care professional for advice. This medicine does not protect you against HIV infection (AIDS) or any other sexually transmitted diseases. What side effects may I notice from receiving this medicine? Side effects that you should report to your doctor or health care professional as soon as possible: -allergic reactions like skin rash, itching or hives, swelling of the face, lips, or tongue -breast tissue changes or discharge -changes in vaginal bleeding during your period or between your periods -changes in vision -chest pain -confusion -coughing up blood -dizziness -feeling faint or lightheaded -headaches or migraines -leg, arm or groin pain -loss of balance or coordination -severe or sudden headaches -stomach pain (severe) -sudden shortness of breath -sudden numbness or weakness of the face, arm or leg -symptoms of vaginal infection like itching, irritation or unusual discharge -tenderness in the upper abdomen -trouble speaking or understanding -vomiting -yellowing of the eyes or skin Side effects that usually do not require medical attention (report to your doctor or health care professional if they continue or are bothersome): -breakthrough bleeding and spotting that continues beyond the 3 initial cycles of pills -breast tenderness -mood changes, anxiety, depression, frustration, anger, or emotional outbursts -increased sensitivity to sun or ultraviolet light -nausea -skin rash, acne, or brown spots on the skin -weight gain (slight) This list may not describe all possible side effects. Call your doctor for medical  advice about side effects. You may report side effects to FDA at 1-800-FDA-1088. Where should I keep my medicine? Keep out of the reach of children. Store at room temperature between 15 and 30 degrees C (59 and 86 degrees F). Throw away any unused medicine after the expiration date. NOTE: This sheet is a summary. It may not cover all possible information. If you have questions about this medicine, talk to your doctor, pharmacist, or health care provider.  2018 Elsevier/Gold Standard (2016-06-15 08:04:41) Preventive Care 18-39 Years, Female Preventive care refers to lifestyle choices and visits with your health care provider that can promote health and wellness. What does preventive care include?  A yearly physical exam. This is also called an annual well check.  Dental exams once or twice a year.  Routine eye exams. Ask your health care provider how often you should have your eyes checked.  Personal lifestyle choices, including: ? Daily care of your teeth and gums. ? Regular physical activity. ? Eating a healthy diet. ? Avoiding tobacco and drug use. ? Limiting alcohol use. ? Practicing safe sex. ? Taking vitamin and mineral supplements as recommended by your health care provider. What happens during an annual well check? The services and screenings done by your health care provider during your annual well check will depend on your age, overall health, lifestyle risk factors,  and family history of disease. Counseling Your health care provider may ask you questions about your:  Alcohol use.  Tobacco use.  Drug use.  Emotional well-being.  Home and relationship well-being.  Sexual activity.  Eating habits.  Work and work Statistician.  Method of birth control.  Menstrual cycle.  Pregnancy history.  Screening You may have the following tests or measurements:  Height, weight, and BMI.  Diabetes screening. This is done by checking your blood sugar (glucose) after  you have not eaten for a while (fasting).  Blood pressure.  Lipid and cholesterol levels. These may be checked every 5 years starting at age 60.  Skin check.  Hepatitis C blood test.  Hepatitis B blood test.  Sexually transmitted disease (STD) testing.  BRCA-related cancer screening. This may be done if you have a family history of breast, ovarian, tubal, or peritoneal cancers.  Pelvic exam and Pap test. This may be done every 3 years starting at age 55. Starting at age 23, this may be done every 5 years if you have a Pap test in combination with an HPV test.  Discuss your test results, treatment options, and if necessary, the need for more tests with your health care provider. Vaccines Your health care provider may recommend certain vaccines, such as:  Influenza vaccine. This is recommended every year.  Tetanus, diphtheria, and acellular pertussis (Tdap, Td) vaccine. You may need a Td booster every 10 years.  Varicella vaccine. You may need this if you have not been vaccinated.  HPV vaccine. If you are 14 or younger, you may need three doses over 6 months.  Measles, mumps, and rubella (MMR) vaccine. You may need at least one dose of MMR. You may also need a second dose.  Pneumococcal 13-valent conjugate (PCV13) vaccine. You may need this if you have certain conditions and were not previously vaccinated.  Pneumococcal polysaccharide (PPSV23) vaccine. You may need one or two doses if you smoke cigarettes or if you have certain conditions.  Meningococcal vaccine. One dose is recommended if you are age 27-21 years and a first-year college student living in a residence hall, or if you have one of several medical conditions. You may also need additional booster doses.  Hepatitis A vaccine. You may need this if you have certain conditions or if you travel or work in places where you may be exposed to hepatitis A.  Hepatitis B vaccine. You may need this if you have certain conditions  or if you travel or work in places where you may be exposed to hepatitis B.  Haemophilus influenzae type b (Hib) vaccine. You may need this if you have certain risk factors.  Talk to your health care provider about which screenings and vaccines you need and how often you need them. This information is not intended to replace advice given to you by your health care provider. Make sure you discuss any questions you have with your health care provider. Document Released: 12/01/2001 Document Revised: 06/24/2016 Document Reviewed: 08/06/2015 Elsevier Interactive Patient Education  [employer].

## 2018-05-28 LAB — CBC
Hematocrit: 40.2 % (ref 34.0–46.6)
Hemoglobin: 13.3 g/dL (ref 11.1–15.9)
MCH: 28.7 pg (ref 26.6–33.0)
MCHC: 33.1 g/dL (ref 31.5–35.7)
MCV: 87 fL (ref 79–97)
Platelets: 288 10*3/uL (ref 150–450)
RBC: 4.64 x10E6/uL (ref 3.77–5.28)
RDW: 13.4 % (ref 12.3–15.4)
WBC: 8.3 10*3/uL (ref 3.4–10.8)

## 2018-05-28 LAB — COMPREHENSIVE METABOLIC PANEL
A/G RATIO: 1.6 (ref 1.2–2.2)
ALT: 10 IU/L (ref 0–32)
AST: 18 IU/L (ref 0–40)
Albumin: 4.1 g/dL (ref 3.5–5.5)
Alkaline Phosphatase: 92 IU/L (ref 39–117)
BUN/Creatinine Ratio: 11 (ref 9–23)
BUN: 7 mg/dL (ref 6–20)
CALCIUM: 8.7 mg/dL (ref 8.7–10.2)
CHLORIDE: 104 mmol/L (ref 96–106)
CO2: 20 mmol/L (ref 20–29)
Creatinine, Ser: 0.63 mg/dL (ref 0.57–1.00)
GFR, EST AFRICAN AMERICAN: 143 mL/min/{1.73_m2} (ref >59)
GFR, EST NON AFRICAN AMERICAN: 124 mL/min/{1.73_m2} (ref >59)
GLOBULIN, TOTAL: 2.6 g/dL (ref 1.5–4.5)
Glucose: 74 mg/dL (ref 65–99)
POTASSIUM: 4.2 mmol/L (ref 3.5–5.2)
Sodium: 139 mmol/L (ref 134–144)
TOTAL PROTEIN: 6.7 g/dL (ref 6.0–8.5)

## 2018-05-28 LAB — THYROID PANEL WITH TSH
FREE THYROXINE INDEX: 1.8 (ref 1.2–4.9)
T3 UPTAKE RATIO: 19 % — AB (ref 24–39)
T4, Total: 9.3 ug/dL (ref 4.5–12.0)
TSH: 1.74 u[IU]/mL (ref 0.450–4.500)

## 2018-05-28 LAB — HEMOGLOBIN A1C
Est. average glucose Bld gHb Est-mCnc: 94 mg/dL
Hgb A1c MFr Bld: 4.9 % (ref 4.8–5.6)

## 2018-05-31 LAB — NUSWAB VAGINITIS (VG)
Atopobium vaginae: HIGH Score — AB
CANDIDA ALBICANS, NAA: NEGATIVE
CANDIDA GLABRATA, NAA: NEGATIVE
Megasphaera 1: HIGH Score — AB
TRICH VAG BY NAA: NEGATIVE

## 2018-06-03 ENCOUNTER — Telehealth: Payer: Self-pay | Admitting: Certified Nurse Midwife

## 2018-06-03 ENCOUNTER — Other Ambulatory Visit: Payer: Self-pay

## 2018-06-03 MED ORDER — METRONIDAZOLE 500 MG PO TABS: 500.0000 mg | ORAL_TABLET | Freq: Two times a day (BID) | ORAL | 0 refills | Status: DC

## 2018-06-03 NOTE — Telephone Encounter (Signed)
The patient called and stated that she would like to speak with a nurse in regards to some questions she has about her medication and an bacterial infection. Please advise.

## 2018-07-05 ENCOUNTER — Telehealth: Payer: Self-pay | Admitting: Certified Nurse Midwife

## 2018-07-05 ENCOUNTER — Other Ambulatory Visit: Payer: Self-pay

## 2018-07-05 MED ORDER — NORELGESTROMIN-ETH ESTRADIOL 150-35 MCG/24HR TD PTWK: 1.0000 | MEDICATED_PATCH | TRANSDERMAL | 12 refills | Status: DC

## 2018-07-05 NOTE — Telephone Encounter (Signed)
Patch is fine.

## 2018-07-05 NOTE — Telephone Encounter (Signed)
Patient called stating she was given a sample of the birth control and would like a script sent in. She would like it sent to [facility] on [address]. Thanks

## 2018-07-05 NOTE — Telephone Encounter (Signed)
Is she on an OCP or progestin only pill. If OCP then patch should be fine. I would just confirm that she knows how to use the patch.   Thanks,  Pattricia Boss

## 2019-05-29 ENCOUNTER — Other Ambulatory Visit: Payer: Self-pay

## 2019-05-29 ENCOUNTER — Encounter: Payer: Self-pay | Admitting: Certified Nurse Midwife

## 2019-05-29 ENCOUNTER — Ambulatory Visit (INDEPENDENT_AMBULATORY_CARE_PROVIDER_SITE_OTHER): Payer: Medicaid Other | Admitting: Certified Nurse Midwife

## 2019-05-29 VITALS — BP 110/69 | HR 85 | Ht 61.0 in | Wt 167.0 lb

## 2019-05-29 DIAGNOSIS — N946 Dysmenorrhea, unspecified: Secondary | ICD-10-CM | POA: Diagnosis not present

## 2019-05-29 DIAGNOSIS — Z6831 Body mass index (BMI) 31.0-31.9, adult: Secondary | ICD-10-CM

## 2019-05-29 DIAGNOSIS — R635 Abnormal weight gain: Secondary | ICD-10-CM

## 2019-05-29 DIAGNOSIS — Z0000 Encounter for general adult medical examination without abnormal findings: Secondary | ICD-10-CM | POA: Diagnosis not present

## 2019-05-29 DIAGNOSIS — Z01419 Encounter for gynecological examination (general) (routine) without abnormal findings: Secondary | ICD-10-CM | POA: Diagnosis not present

## 2019-05-29 DIAGNOSIS — R5383 Other fatigue: Secondary | ICD-10-CM

## 2019-05-29 NOTE — Progress Notes (Signed)
Patient here for annual exam.  Patient c/o severe cramping and nausea while on menstrual period x4 months.

## 2019-05-29 NOTE — Patient Instructions (Addendum)
Dysmenorrhea  Dysmenorrhea refers to cramps caused by the muscles of the uterus tightening (contracting) during a menstrual period. Dysmenorrhea may be mild, or it may be severe enough to interfere with everyday activities for a few days each month. Primary dysmenorrhea is menstrual cramps that last a couple of days when you start having menstrual periods or soon after. This often begins after a teenager starts having her period. As a woman gets older or has a baby, the cramps will usually lessen or disappear. Secondary dysmenorrhea begins later in life and is caused by a disorder in the reproductive system. It lasts longer, and it may cause more pain than primary dysmenorrhea. The pain may start before the period and last a few days after the period. What are the causes? Dysmenorrhea is usually caused by an underlying problem, such as:  The tissue that lines the uterus (endometrium) growing outside of the uterus in other areas of the body (endometriosis).  Endometrial tissue growing into the muscular walls of the uterus (adenomyosis).  Blood vessels in the pelvis becoming filled with blood just before the menstrual period (pelvic congestive syndrome).  Overgrowth of cells (polyps) in the endometrium or the lower part of the uterus (cervix).  The uterus dropping down into the vagina (prolapse) due to stretched or weak muscles.  Bladder problems, such as infection or inflammation.  Intestinal problems, such as a tumor or irritable bowel syndrome.  Cancer of the reproductive organs or bladder.  A severely tipped uterus.  A cervix that is closed or has a very small opening.  Noncancerous (benign) tumors of the uterus (fibroids).  Pelvic inflammatory disease (PID).  Pelvic scarring (adhesions) from a previous surgery.  An ovarian cyst.  An IUD (intrauterine device). What increases the risk? You are more likely to develop this condition if:  You are younger than age 67.  You  started puberty early.  You have irregular or heavy bleeding.  You have never given birth.  You have a family history of dysmenorrhea.  You smoke. What are the signs or symptoms? Symptoms of this condition include:  Cramping, throbbing pain, or a feeling of fullness in the lower abdomen.  Lower back pain.  Periods lasting for longer than 7 days.  Headaches.  Bloating.  Fatigue.  Nausea or vomiting.  Diarrhea.  Sweating or dizziness.  Loose stools. How is this diagnosed? This condition may be diagnosed based on:  Your symptoms.  Your medical history.  A physical exam.  Blood tests.  A Pap test. This is a test in which cells from the cervix are tested for signs of cancer or infection.  A pregnancy test.  Imaging tests, such as: ? Ultrasound. ? A procedure to remove and examine a sample of endometrial tissue (dilation and curettage, D&C). ? A procedure to visually examine the inside of:  The uterus (hysteroscopy).  The abdomen or pelvis (laparoscopy).  The bladder (cystoscopy).  The intestine (colonoscopy).  The stomach (gastroscopy). ? X-rays. ? CT scan. ? MRI. How is this treated? Treatment depends on the cause of the dysmenorrhea. Treatment may include:  Pain medicine prescribed by your health care provider.  Birth control pills that contain the hormone progesterone.  An IUD that contains the hormone progesterone.  Medicines to control bleeding.  Hormone replacement therapy.  NSAIDs. These may help to stop the production of hormones that cause cramps.  Antidepressant medicines.  Surgery to remove adhesions, endometriosis, ovarian cysts, fibroids, or the entire uterus (hysterectomy).  Injections of progesterone  to stop the menstrual period.  A procedure to destroy the endometrium (endometrial ablation).  A procedure to cut the nerves in the bottom of the spine (sacrum) that go to the reproductive organs (presacral neurectomy).  A  procedure to apply an electric current to nerves in the sacrum (sacral nerve stimulation).  Exercise and physical therapy.  Meditation and yoga therapy.  Acupuncture. Work with your health care provider to determine what treatment or combination of treatments is best for you. Follow these instructions at home: Relieving pain and cramping  Apply heat to your lower back or abdomen when you experience pain or cramps. Use the heat source that your health care provider recommends, such as a moist heat pack or a heating pad. ? Place a towel between your skin and the heat source. ? Leave the heat on for 20-30 minutes. ? Remove the heat if your skin turns bright red. This is especially important if you are unable to feel pain, heat, or cold. You may have a greater risk of getting burned. ? Do not sleep with a heating pad on.  Do aerobic exercises, such as walking, swimming, or biking. This can help to relieve cramps.  Massage your lower back or abdomen to help relieve pain. General instructions  Take over-the-counter and prescription medicines only as told by your health care provider.  Do not drive or use heavy machinery while taking prescription pain medicine.  Avoid alcohol and caffeine during and right before your menstrual period. These can make cramps worse.  Do not use any products that contain nicotine or tobacco, such as cigarettes and e-cigarettes. If you need help quitting, ask your health care provider.  Keep all follow-up visits as told by your health care provider. This is important. Contact a health care provider if:  You have pain that gets worse or does not get better with medicine.  You have pain with sex.  You develop nausea or vomiting with your period that is not controlled with medicine. Get help right away if:  You faint. Summary  Dysmenorrhea refers to cramps caused by the muscles of the uterus tightening (contracting) during a menstrual period.   Dysmenorrhea may be mild, or it may be severe enough to interfere with everyday activities for a few days each month.  Treatment depends on the cause of the dysmenorrhea.  Work with your health care provider to determine what treatment or combination of treatments is best for you. This information is not intended to replace advice given to you by your health care provider. Make sure you discuss any questions you have with your health care provider. Document Released: 10/05/2005 Document Revised: 09/17/2017 Document Reviewed: 11/07/2016 Elsevier Patient Education  2020 [place] 73-54 Years Old, Female Preventive care refers to visits with your health care provider and lifestyle choices that can promote health and wellness. This includes:  A yearly physical exam. This may also be called an annual well check.  Regular dental visits and eye exams.  Immunizations.  Screening for certain conditions.  Healthy lifestyle choices, such as eating a healthy diet, getting regular exercise, not using drugs or products that contain nicotine and tobacco, and limiting alcohol use. What can I expect for my preventive care visit? Physical exam Your health care provider will check your:  Height and weight. This may be used to calculate body mass index (BMI), which tells if you are at a healthy weight.  Heart rate and blood pressure.  Skin for abnormal spots.  Counseling Your health care provider may ask you questions about your:  Alcohol, tobacco, and drug use.  Emotional well-being.  Home and relationship well-being.  Sexual activity.  Eating habits.  Work and work Statistician.  Method of birth control.  Menstrual cycle.  Pregnancy history. What immunizations do I need?  Influenza (flu) vaccine  This is recommended every year. Tetanus, diphtheria, and pertussis (Tdap) vaccine  You may need a Td booster every 10 years. Varicella (chickenpox) vaccine  You  may need this if you have not been vaccinated. Human papillomavirus (HPV) vaccine  If recommended by your health care provider, you may need three doses over 6 months. Measles, mumps, and rubella (MMR) vaccine  You may need at least one dose of MMR. You may also need a second dose. Meningococcal conjugate (MenACWY) vaccine  One dose is recommended if you are age 80-21 years and a first-year college student living in a residence hall, or if you have one of several medical conditions. You may also need additional booster doses. Pneumococcal conjugate (PCV13) vaccine  You may need this if you have certain conditions and were not previously vaccinated. Pneumococcal polysaccharide (PPSV23) vaccine  You may need one or two doses if you smoke cigarettes or if you have certain conditions. Hepatitis A vaccine  You may need this if you have certain conditions or if you travel or work in places where you may be exposed to hepatitis A. Hepatitis B vaccine  You may need this if you have certain conditions or if you travel or work in places where you may be exposed to hepatitis B. Haemophilus influenzae type b (Hib) vaccine  You may need this if you have certain conditions. You may receive vaccines as individual doses or as more than one vaccine together in one shot (combination vaccines). Talk with your health care provider about the risks and benefits of combination vaccines. What tests do I need?  Blood tests  Lipid and cholesterol levels. These may be checked every 5 years starting at age 35.  Hepatitis C test.  Hepatitis B test. Screening  Diabetes screening. This is done by checking your blood sugar (glucose) after you have not eaten for a while (fasting).  Sexually transmitted disease (STD) testing.  BRCA-related cancer screening. This may be done if you have a family history of breast, ovarian, tubal, or peritoneal cancers.  Pelvic exam and Pap test. This may be done every 3  years starting at age 33. Starting at age 15, this may be done every 5 years if you have a Pap test in combination with an HPV test. Talk with your health care provider about your test results, treatment options, and if necessary, the need for more tests. Follow these instructions at home: Eating and drinking   Eat a diet that includes fresh fruits and vegetables, whole grains, lean protein, and low-fat dairy.  Take vitamin and mineral supplements as recommended by your health care provider.  Do not drink alcohol if: ? Your health care provider tells you not to drink. ? You are pregnant, may be pregnant, or are planning to become pregnant.  If you drink alcohol: ? Limit how much you have to 0-1 drink a day. ? Be aware of how much alcohol is in your drink. In the [place] one drink equals one 12 oz bottle of beer (355 mL), one 5 oz glass of wine (148 mL), or one 1 oz glass of hard liquor (44 mL). Lifestyle  Take daily care of  your teeth and gums.  Stay active. Exercise for at least 30 minutes on 5 or more days each week.  Do not use any products that contain nicotine or tobacco, such as cigarettes, e-cigarettes, and chewing tobacco. If you need help quitting, ask your health care provider.  If you are sexually active, practice safe sex. Use a condom or other form of birth control (contraception) in order to prevent pregnancy and STIs (sexually transmitted infections). If you plan to become pregnant, see your health care provider for a preconception visit. What's next?  Visit your health care provider once a year for a well check visit.  Ask your health care provider how often you should have your eyes and teeth checked.  Stay up to date on all vaccines. This information is not intended to replace advice given to you by your health care provider. Make sure you discuss any questions you have with your health care provider. Document Released: 12/01/2001 Document Revised: 06/16/2018  Document Reviewed: 06/16/2018 Elsevier Patient Education  [place].  Mefenamic acid capsules What is this medicine? MEFENAMIC ACID (me fe NAM ik AS id) is a non-steroidal anti-inflammatory drug (NSAID). It is used to reduce swelling and to treat pain. This medicine may be used to treat osteoarthritis and rheumatoid arthritis. It is also used to treat painful menstrual periods. This medicine may be used for other purposes; ask your health care provider or pharmacist if you have questions. COMMON BRAND NAME(S): Ponstel What should I tell my health care provider before I take this medicine? They need to know if you have any of these conditions:  cigarette smoker  coronary artery bypass graft (CABG) surgery within the past 2 weeks  drink more than 3 alcohol-containing beverages a day  heart disease  high blood pressure  history of stomach bleeding  kidney disease  liver disease  lung or breathing disease, like asthma  an unusual or allergic reaction to mefenamic acid, aspirin, other NSAIDs, other medicines, foods, dyes, or preservatives  pregnant or trying to get pregnant  breast-feeding How should I use this medicine? Take this medicine by mouth with a full glass of water. Follow the directions on the prescription label. Take this medicine with food if your stomach gets upset. Try to not lie down for at least 10 minutes after you take the medicine. Take your medicine at regular intervals. Do not take your medicine more often than directed. Long-term, continuous use may increase the risk of heart attack or stroke. A special MedGuide will be given to you by the pharmacist with each prescription and refill. Be sure to read this information carefully each time. Talk to your pediatrician regarding the use of this medicine in children. While this drug may be prescribed for children as young as 69 years old for selected conditions, precautions do apply. Patients over 47 years  old may have a stronger reaction and need a smaller dose. Overdosage: If you think you have taken too much of this medicine contact a poison control center or emergency room at once. NOTE: This medicine is only for you. Do not share this medicine with others. What if I miss a dose? If you miss a dose, take it as soon as you can. If it is almost time for your next dose, take only that dose. Do not take double or extra doses. What may interact with this medicine? Do not take this medicine with any of the following medications:  cidofovir  ketorolac  methotrexate This  medicine may also interact with the following medications:  alcohol  alendronate  antacids with magnesium  aspirin and aspirin-like medicines  diuretics  herbal products that contain feverfew, garlic, ginger, or ginkgo biloba  lithium  medicines for high blood pressure  medicines that treat or prevent blood clots like warfarin  other NSAIDs, medicines for pain and inflammation, like ibuprofen or naproxen  pemetrexed This list may not describe all possible interactions. Give your health care provider a list of all the medicines, herbs, non-prescription drugs, or dietary supplements you use. Also tell them if you smoke, drink alcohol, or use illegal drugs. Some items may interact with your medicine. What should I watch for while using this medicine? Tell your doctor or healthcare provider if your pain does not get better. Talk to your doctor before taking another medicine for pain. Do not treat yourself. This medicine may cause serious skin reactions. They can happen weeks to months after starting the medicine. Contact your healthcare provider right away if you notice fevers or flu-like symptoms with a rash. The rash may be red or purple and then turn into blisters or peeling of the skin. Or, you might notice a red rash with swelling of the face, lips or lymph nodes in your neck or under your arms. This medicine does  not prevent heart attack or stroke. In fact, this medicine may increase the chance of a heart attack or stroke. The chance may increase with longer use of this medicine and in people who have heart disease. If you take aspirin to prevent heart attack or stroke, talk with your doctor or healthcare provider. Do not take medicines such as ibuprofen and naproxen with this medicine. Side effects such as stomach upset, nausea, or ulcers may be more likely to occur. Many medicines available without a prescription should not be taken with this medicine. This medicine can cause ulcers and bleeding in the stomach and intestines at any time during treatment. Do not smoke cigarettes or drink alcohol. These increase irritation to your stomach and can make it more susceptible to damage from this medicine. Ulcers and bleeding can happen without warning symptoms and can cause death. What side effects may I notice from receiving this medicine? Side effects that you should report to your doctor or health care professional as soon as possible:  allergic reactions like skin rash, itching or hives, swelling of the face, lips, or tongue  black tarry stools  blurred vision  breathing problems  chest pain  general ill feeling or 'flu-like' symptoms  high blood pressure  right upper belly pain  redness, blistering, peeling, or loosening of the skin, including inside the mouth  slurring of speech  stomach pain or cramps  sudden weight gain or swelling  trouble passing urine or change in the amount of urine  unusual bleeding or bruising  unusually weak or tired  vomit that looks like blood or coffee grounds  yellowing of the eyes or skin Side effects that usually do not require medical attention (report to your doctor or health care professional if they continue or are bothersome):  diarrhea or constipation  dizziness, drowsiness  gas or heartburn  headache  nausea, vomiting  unusually  sensitive to the sun This list may not describe all possible side effects. Call your doctor for medical advice about side effects. You may report side effects to FDA at 1-800-FDA-1088. Where should I keep my medicine? Keep out of the reach of children. Store at room temperature between  15 and 30 degrees C (59 and 86 degrees F). Throw away any unused medicine after the expiration date. NOTE: This sheet is a summary. It may not cover all possible information. If you have questions about this medicine, talk to your doctor, pharmacist, or health care provider.  2020 Elsevier/[employer] (2018-12-28 12:36:26)

## 2019-05-29 NOTE — Progress Notes (Signed)
ANNUAL PREVENTATIVE CARE GYN  ENCOUNTER NOTE  Subjective:       Jessica Mcintosh is a 28 y.o. [id number] female here for a routine annual gynecologic exam.  Current complaints: 1. Dysmenorrhea-cramping and nausea during menses for the last four (4) months 2. Weight gain  Denies difficulty breathing or respiratory distress, chest pain, abdominal pain, excessive vaginal bleeding, dysuria, and leg pain or swelling.    Gynecologic History  Patient's last menstrual period was 05/29/2019 (exact date). Period Cycle (Days): 28 Period Duration (Days): 8 Period Pattern: Regular Menstrual Flow: Heavy Menstrual Control: Tampon, Thin pad Dysmenorrhea: (!) Severe Dysmenorrhea Symptoms: Cramping  Contraception: Ortho-Evra patches weekly  Last Pap: 04/2017. Results were: normal  Obstetric History  OB History  Gravida Para Term Preterm AB Living  1 1 1     1   SAB TAB Ectopic Multiple Live Births        0 1    # Outcome Date GA Lbr Len/2nd Weight Sex Delivery Anes PTL Lv  1 Term 10/08/17 [id number] 03:50 / 00:42 7 lb 4.8 oz (3.31 kg) M Vag-Spont EPI  LIV     Complications: Preeclampsia    Past Medical History:  Diagnosis Date  . Ovarian cyst   . Preeclampsia 2018    Past Surgical History:  Procedure Laterality Date  . NO PAST SURGERIES      Current Outpatient Medications on File Prior to Visit  Medication Sig Dispense Refill  . norelgestromin-ethinyl estradiol (ORTHO EVRA) 150-35 MCG/24HR transdermal patch Place 1 patch onto the skin once a week. 3 patch 12  . Prenatal Vit-Fe Fumarate-FA (PRENATAL MULTIVITAMIN) TABS tablet Take 1 tablet by mouth daily at 12 noon.     No current facility-administered medications on file prior to visit.     Allergies  Allergen Reactions  . Aspirin Hives, Itching, Rash, Shortness Of Breath and Swelling  . Penicillins Hives and Rash    Social History   Socioeconomic History  . Marital status: Single    Spouse name: Not on file  . Number of  children: Not on file  . Years of education: Not on file  . Highest education level: Not on file  Occupational History  . Not on file  Social Needs  . Financial resource strain: Not on file  . Food insecurity    Worry: Not on file    Inability: Not on file  . Transportation needs    Medical: Not on file    Non-medical: Not on file  Tobacco Use  . Smoking status: Never Smoker  . Smokeless tobacco: Never Used  Substance and Sexual Activity  . Alcohol use: No  . Drug use: No  . Sexual activity: Yes    Birth control/protection: Patch  Lifestyle  . Physical activity    Days per week: Not on file    Minutes per session: Not on file  . Stress: Not on file  Relationships  . Social Musicianconnections    Talks on phone: Not on file    Gets together: Not on file    Attends religious service: Not on file    Active member of club or organization: Not on file    Attends meetings of clubs or organizations: Not on file    Relationship status: Not on file  . Intimate partner violence    Fear of current or ex partner: Not on file    Emotionally abused: Not on file    Physically abused: Not on file    Forced  sexual activity: Not on file  Other Topics Concern  . Not on file  Social History Narrative  . Not on file    Family History  Problem Relation Age of Onset  . Depression Mother   . Hypertension Mother   . Depression Maternal Aunt   . Hypertension Maternal Aunt   . Cancer Paternal Uncle   . Lung cancer Paternal Uncle   . Breast cancer Maternal Grandmother   . Depression Maternal Grandmother   . Cancer Maternal Grandfather   . Lung cancer Maternal Grandfather   . Cancer Paternal Grandfather   . Skin cancer Paternal Grandfather   . Ovarian cancer Neg Hx   . Colon cancer Neg Hx     The following portions of the patient's history were reviewed and updated as appropriate: allergies, current medications, past family history, past medical history, past social history, past surgical  history and problem list.  Review of Systems  ROS negative except as noted above. Information obtained from patient.    Objective:   BP 110/69   Pulse 85   Ht 5\' 1"  (1.549 m)   Wt 167 lb (75.8 kg)   LMP 05/29/2019 (Exact Date)   Breastfeeding No   BMI 31.55 kg/m    CONSTITUTIONAL: Well-developed, well-nourished female in no acute distress.   PSYCHIATRIC: Normal mood and affect. Normal behavior. Normal judgment and thought content.  NEUROLGIC: Alert and oriented to person, place, and time. Normal muscle tone coordination. No cranial nerve deficit noted.  HENT:  Normocephalic, atraumatic, External right and left ear normal.  EYES: Conjunctivae and EOM are normal. Pupils are equal and round.   NECK: Normal range of motion, supple, no masses.  Normal thyroid.   SKIN: Skin is warm and dry. No rash noted. Not diaphoretic. No erythema. No pallor.  CARDIOVASCULAR: Normal heart rate noted, regular rhythm, no murmur.  RESPIRATORY: Clear to auscultation bilaterally. Effort and breath sounds normal, no problems with respiration noted.  BREASTS: Symmetric in size. No masses, skin changes, nipple drainage, or lymphadenopathy.  ABDOMEN: Soft, normal bowel sounds, no distention noted.  No tenderness, rebound or guarding.   PELVIC:  External Genitalia: Normal  Vagina: Normal  Cervix: Normal  Uterus: Normal  Adnexa: Normal  MUSCULOSKELETAL: Normal range of motion. No tenderness.  No cyanosis, clubbing, or edema.  2+ distal pulses.  LYMPHATIC: No Axillary, Supraclavicular, or Inguinal Adenopathy.  Assessment:   Annual gynecologic examination 28 y.o.   Contraception: Ortho-Evra patches weekly   Obesity 1   Problem List Items Addressed This Visit    None    Visit Diagnoses    Well woman exam    -  Primary   Relevant Orders   CBC   Thyroid Panel With TSH   Beta HCG, Quant   Dysmenorrhea       Weight gain       Relevant Orders   Thyroid Panel With TSH   Beta HCG, Quant    Other fatigue       Relevant Orders   CBC   Thyroid Panel With TSH   Beta HCG, Quant   BMI 31.0-31.9,adult       Relevant Orders   Thyroid Panel With TSH      Plan:   Discussed medical and herbal options for management/treatment of Dysmenorrhea; handouts provided  Wishes to continue Ortho Evra patches at this time  Pap: Not needed  Labs: See orders   Routine preventative health maintenance measures emphasized: Exercise/Diet/Weight control, Tobacco Warnings, Alcohol/Substance  use risks and Stress Management; see AVS  Reviewed red flag symptoms and when to call  RTC for ultrasound with symptoms worsen or fail to improve with home treatment measures  RTC x 1 year for ANNUAL EXAM or sooner if needed   Diona Fanti, CNM [facility], [facility] 05/29/19 3:02 PM

## 2019-05-30 ENCOUNTER — Other Ambulatory Visit: Payer: Self-pay | Admitting: Certified Nurse Midwife

## 2019-05-30 DIAGNOSIS — N946 Dysmenorrhea, unspecified: Secondary | ICD-10-CM | POA: Insufficient documentation

## 2019-05-30 LAB — THYROID PANEL WITH TSH
Free Thyroxine Index: 1.6 (ref 1.2–4.9)
T3 Uptake Ratio: 15 % — ABNORMAL LOW (ref 24–39)
T4, Total: 10.6 ug/dL (ref 4.5–12.0)
TSH: 1.14 u[IU]/mL (ref 0.450–4.500)

## 2019-05-30 LAB — CBC
Hematocrit: 38.5 % (ref 34.0–46.6)
Hemoglobin: 13 g/dL (ref 11.1–15.9)
MCH: 28.4 pg (ref 26.6–33.0)
MCHC: 33.8 g/dL (ref 31.5–35.7)
MCV: 84 fL (ref 79–97)
Platelets: 347 10*3/uL (ref 150–450)
RBC: 4.57 x10E6/uL (ref 3.77–5.28)
RDW: 12.3 % (ref 11.7–15.4)
WBC: 10.7 10*3/uL (ref 3.4–10.8)

## 2019-05-30 LAB — BETA HCG QUANT (REF LAB): hCG Quant: <1 m[IU]/mL

## 2019-05-30 MED ORDER — ONDANSETRON 4 MG PO TBDP: 4.0000 mg | ORAL_TABLET | Freq: Four times a day (QID) | ORAL | 0 refills | Status: DC | PRN

## 2019-05-30 MED ORDER — IBUPROFEN 800 MG PO TABS: 800.0000 mg | ORAL_TABLET | Freq: Three times a day (TID) | ORAL | 1 refills | Status: DC | PRN

## 2019-06-27 ENCOUNTER — Other Ambulatory Visit: Payer: Self-pay | Admitting: Certified Nurse Midwife

## 2019-10-26 ENCOUNTER — Ambulatory Visit
Admission: EM | Admit: 2019-10-26 | Discharge: 2019-10-26 | Disposition: A | Discharge status: Home / Self Care | Payer: Medicaid Other | Attending: Family Medicine | Admitting: Family Medicine

## 2019-10-26 ENCOUNTER — Encounter: Payer: Self-pay | Admitting: Emergency Medicine

## 2019-10-26 ENCOUNTER — Other Ambulatory Visit: Payer: Self-pay

## 2019-10-26 DIAGNOSIS — N3001 Acute cystitis with hematuria: Secondary | ICD-10-CM | POA: Insufficient documentation

## 2019-10-26 LAB — URINALYSIS, COMPLETE (UACMP) WITH MICROSCOPIC
Specific Gravity, Urine: 1.02 (ref 1.005–1.030)
pH: 5 (ref 5.0–8.0)

## 2019-10-26 MED ORDER — NITROFURANTOIN MONOHYD MACRO 100 MG PO CAPS: 100.0000 mg | ORAL_CAPSULE | Freq: Two times a day (BID) | ORAL | 0 refills | Status: DC

## 2019-10-26 NOTE — ED Provider Notes (Signed)
[facility]    CSN: [id number] Arrival date & time: 10/26/19  1751  History   Chief Complaint Chief Complaint  Patient presents with  . Dysuria    APPT    HPI  29 year old female presents with dysuria.  Patient reports her symptoms started abruptly this morning.  She reports dysuria and urinary urgency.  She has been taking Azo without resolution.  Denies flank pain, abdominal pain, fever.  Denies vaginal discharge.  Rates her pain as 9/10 in severity.  No relieving factors.  No other reported symptoms.  No other complaints.  PMH, Surgical Hx, Family Hx, Social History reviewed and updated as below.  Past Medical History:  Diagnosis Date  . Ovarian cyst   . Preeclampsia 2018    Patient Active Problem List   Diagnosis Date Noted  . Dysmenorrhea 05/30/2019  . History of gestational hypertension 10/07/2017    Past Surgical History:  Procedure Laterality Date  . NO PAST SURGERIES      OB History    Gravida  1   Para  1   Term  1   Preterm      AB      Living  1     SAB      TAB      Ectopic      Multiple  0   Live Births  1            Home Medications    Prior to Admission medications   Medication Sig Start Date End Date Taking? Authorizing Provider  ibuprofen (ADVIL) 800 MG tablet Take 1 tablet (800 mg total) by mouth every 8 (eight) hours as needed. 05/30/19  Yes Lawhorn, Vanessa [place], CNM  ondansetron (ZOFRAN ODT) 4 MG disintegrating tablet Take 1 tablet (4 mg total) by mouth every 6 (six) hours as needed for nausea. 05/30/19  Yes Lawhorn, Vanessa [place], CNM  Prenatal Vit-Fe Fumarate-FA (PRENATAL MULTIVITAMIN) TABS tablet Take 1 tablet by mouth daily at 12 noon.   Yes [provider]  Burr Medico 150-35 MCG/24HR transdermal patch PLACE 1 PATCH ONTO THE SKIN ONCE A WEEK 06/27/19  Yes Doreene Burke, CNM  nitrofurantoin, macrocrystal-monohydrate, (MACROBID) 100 MG capsule Take 1 capsule (100 mg total) by mouth 2 (two) times  daily. 10/26/19   Tommie Sams, DO    Family History Family History  Problem Relation Age of Onset  . Depression Mother   . Hypertension Mother   . Depression Maternal Aunt   . Hypertension Maternal Aunt   . Cancer Paternal Uncle   . Lung cancer Paternal Uncle   . Breast cancer Maternal Grandmother   . Depression Maternal Grandmother   . Cancer Maternal Grandfather   . Lung cancer Maternal Grandfather   . Cancer Paternal Grandfather   . Skin cancer Paternal Grandfather   . Alcoholism Father   . Hypertension Father   . Ovarian cancer Neg Hx   . Colon cancer Neg Hx     Social History Social History   Tobacco Use  . Smoking status: Never Smoker  . Smokeless tobacco: Never Used  Substance Use Topics  . Alcohol use: No  . Drug use: No     Allergies   Aspirin and Penicillins   Review of Systems Review of Systems  Constitutional: Negative.   Genitourinary: Positive for dysuria and urgency.   Physical Exam Triage Vital Signs ED Triage Vitals  Enc Vitals Group     BP 10/26/19 1800 140/73  Pulse Rate 10/26/19 1800 81     Resp 10/26/19 1800 18     Temp 10/26/19 1800 98.6 F (37 C)     Temp Source 10/26/19 1800 Oral     SpO2 10/26/19 1800 99 %     Weight 10/26/19 1801 170 lb (77.1 kg)     Height 10/26/19 1801 5\' 1"  (1.549 m)     Head Circumference --      Peak Flow --      Pain Score 10/26/19 1800 9     Pain Loc --      Pain Edu? --      Excl. in GC? --    Updated Vital Signs BP 140/73 (BP Location: Left Arm)   Pulse 81   Temp 98.6 F (37 C) (Oral)   Resp 18   Ht 5\' 1"  (1.549 m)   Wt 77.1 kg   LMP 10/17/2019 (Exact Date)   SpO2 99%   BMI 32.12 kg/m   Visual Acuity Right Eye Distance:   Left Eye Distance:   Bilateral Distance:    Right Eye Near:   Left Eye Near:    Bilateral Near:     Physical Exam Vitals and nursing note reviewed.  Constitutional:      General: She is not in acute distress.    Appearance: Normal appearance. She is not  ill-appearing.  HENT:     Head: Normocephalic and atraumatic.  Eyes:     General:        Right eye: No discharge.        Left eye: No discharge.     Conjunctiva/sclera: Conjunctivae normal.  Cardiovascular:     Rate and Rhythm: Normal rate and regular rhythm.     Heart sounds: No murmur.  Pulmonary:     Effort: Pulmonary effort is normal.     Breath sounds: Normal breath sounds. No wheezing, rhonchi or rales.  Abdominal:     General: There is no distension.     Palpations: Abdomen is soft.     Tenderness: There is no abdominal tenderness.  Neurological:     Mental Status: She is alert.  Psychiatric:        Mood and Affect: Mood normal.        Behavior: Behavior normal.    UC Treatments / Results  Labs (all labs ordered are listed, but only abnormal results are displayed) Labs Reviewed  URINALYSIS, COMPLETE (UACMP) WITH MICROSCOPIC - Abnormal; Notable for the following components:      Result Value   Color, Urine ORANGE (*)    APPearance HAZY (*)    Glucose, UA   (*)    Value: TEST NOT REPORTED DUE TO COLOR INTERFERENCE OF URINE PIGMENT   Hgb urine dipstick   (*)    Value: TEST NOT REPORTED DUE TO COLOR INTERFERENCE OF URINE PIGMENT   Bilirubin Urine   (*)    Value: TEST NOT REPORTED DUE TO COLOR INTERFERENCE OF URINE PIGMENT   Ketones, ur   (*)    Value: TEST NOT REPORTED DUE TO COLOR INTERFERENCE OF URINE PIGMENT   Protein, ur   (*)    Value: TEST NOT REPORTED DUE TO COLOR INTERFERENCE OF URINE PIGMENT   Nitrite   (*)    Value: TEST NOT REPORTED DUE TO COLOR INTERFERENCE OF URINE PIGMENT   Leukocytes,Ua   (*)    Value: TEST NOT REPORTED DUE TO COLOR INTERFERENCE OF URINE PIGMENT   Non Squamous Epithelial PRESENT (*)  Bacteria, UA FEW (*)    All other components within normal limits  URINE CULTURE    EKG   Radiology No results found.  Procedures Procedures (including critical care time)  Medications Ordered in UC Medications - No data to display   Initial Impression / Assessment and Plan / UC Course  I have reviewed the triage vital signs and the nursing notes.  Pertinent labs & imaging results that were available during my care of the patient were reviewed by me and considered in my medical decision making (see chart for details).    29 year old female presents with UTI.  Treating with Macrobid.  Sending culture.  Final Clinical Impressions(s) / UC Diagnoses   Final diagnoses:  Acute cystitis with hematuria   Discharge Instructions   None    ED Prescriptions    Medication Sig Dispense Auth. Provider   nitrofurantoin, macrocrystal-monohydrate, (MACROBID) 100 MG capsule Take 1 capsule (100 mg total) by mouth 2 (two) times daily. 14 capsule Thersa Salt G, DO     PDMP not reviewed this encounter.   Coral Spikes, [place] 10/26/19 1905

## 2019-10-26 NOTE — ED Triage Notes (Addendum)
Patient in today c/o dysuria and urinary urgency since this morning. Patient is taking AZO. Patient denies any vaginal discharge.

## 2019-10-27 LAB — URINE CULTURE

## 2020-03-20 ENCOUNTER — Ambulatory Visit (INDEPENDENT_AMBULATORY_CARE_PROVIDER_SITE_OTHER): Payer: Medicaid Other | Admitting: Adult Health

## 2020-03-20 ENCOUNTER — Encounter: Payer: Self-pay | Admitting: Adult Health

## 2020-03-20 ENCOUNTER — Other Ambulatory Visit: Payer: Self-pay

## 2020-03-20 ENCOUNTER — Other Ambulatory Visit: Payer: Self-pay | Admitting: Adult Health

## 2020-03-20 VITALS — BP 124/76 | HR 78 | Temp 96.6°F | Ht 62.0 in | Wt 173.8 lb

## 2020-03-20 DIAGNOSIS — R635 Abnormal weight gain: Secondary | ICD-10-CM | POA: Insufficient documentation

## 2020-03-20 DIAGNOSIS — Z0000 Encounter for general adult medical examination without abnormal findings: Secondary | ICD-10-CM | POA: Diagnosis not present

## 2020-03-20 DIAGNOSIS — Z6831 Body mass index (BMI) 31.0-31.9, adult: Secondary | ICD-10-CM | POA: Diagnosis not present

## 2020-03-20 DIAGNOSIS — Z1389 Encounter for screening for other disorder: Secondary | ICD-10-CM | POA: Insufficient documentation

## 2020-03-20 DIAGNOSIS — E559 Vitamin D deficiency, unspecified: Secondary | ICD-10-CM

## 2020-03-20 DIAGNOSIS — R5383 Other fatigue: Secondary | ICD-10-CM | POA: Diagnosis not present

## 2020-03-20 DIAGNOSIS — R102 Pelvic and perineal pain unspecified side: Secondary | ICD-10-CM | POA: Insufficient documentation

## 2020-03-20 DIAGNOSIS — Z13220 Encounter for screening for lipoid disorders: Secondary | ICD-10-CM

## 2020-03-20 HISTORY — DX: Pelvic and perineal pain: R10.2

## 2020-03-20 HISTORY — DX: Vitamin D deficiency, unspecified: E55.9

## 2020-03-20 HISTORY — DX: Encounter for screening for other disorder: Z13.89

## 2020-03-20 HISTORY — DX: Abnormal weight gain: R63.5

## 2020-03-20 HISTORY — DX: Other fatigue: R53.83

## 2020-03-20 HISTORY — DX: Body mass index (BMI) 31.0-31.9, adult: Z68.31

## 2020-03-20 NOTE — Patient Instructions (Signed)
 Calorie Counting for Weight Loss Calories are units of energy. Your body needs a certain amount of calories from food to keep you going throughout the day. When you eat more calories than your body needs, your body stores the extra calories as fat. When you eat fewer calories than your body needs, your body burns fat to get the energy it needs. Calorie counting means keeping track of how many calories you eat and drink each day. Calorie counting can be helpful if you need to lose weight. If you make sure to eat fewer calories than your body needs, you should lose weight. Ask your health care provider what a healthy weight is for you. For calorie counting to work, you will need to eat the right number of calories in a day in order to lose a healthy amount of weight per week. A dietitian can help you determine how many calories you need in a day and will give you suggestions on how to reach your calorie goal.  A healthy amount of weight to lose per week is usually 1-2 lb (0.5-0.9 kg). This usually means that your daily calorie intake should be reduced by 500-750 calories.  Eating 1,200 - 1,500 calories per day can help most women lose weight.  Eating 1,500 - 1,800 calories per day can help most men lose weight. What is my plan? My goal is to have __________ calories per day. If I have this many calories per day, I should lose around __________ pounds per week. What do I need to know about calorie counting? In order to meet your daily calorie goal, you will need to:  Find out how many calories are in each food you would like to eat. Try to do this before you eat.  Decide how much of the food you plan to eat.  Write down what you ate and how many calories it had. Doing this is called keeping a food log. To successfully lose weight, it is important to balance calorie counting with a healthy lifestyle that includes regular activity. Aim for 150 minutes of moderate exercise (such as walking) or 75  minutes of vigorous exercise (such as running) each week. Where do I find calorie information?  The number of calories in a food can be found on a Nutrition Facts label. If a food does not have a Nutrition Facts label, try to look up the calories online or ask your dietitian for help. Remember that calories are listed per serving. If you choose to have more than one serving of a food, you will have to multiply the calories per serving by the amount of servings you plan to eat. For example, the label on a package of bread might say that a serving size is 1 slice and that there are 90 calories in a serving. If you eat 1 slice, you will have eaten 90 calories. If you eat 2 slices, you will have eaten 180 calories. How do I keep a food log? Immediately after each meal, record the following information in your food log:  What you ate. Don't forget to include toppings, sauces, and other extras on the food.  How much you ate. This can be measured in cups, ounces, or number of items.  How many calories each food and drink had.  The total number of calories in the meal. Keep your food log near you, such as in a small notebook in your pocket, or use a mobile app or website. Some programs will   calculate calories for you and show you how many calories you have left for the day to meet your goal. What are some calorie counting tips?   Use your calories on foods and drinks that will fill you up and not leave you hungry: ? Some examples of foods that fill you up are nuts and nut butters, vegetables, lean proteins, and high-fiber foods like whole grains. High-fiber foods are foods with more than 5 g fiber per serving. ? Drinks such as sodas, specialty coffee drinks, alcohol, and juices have a lot of calories, yet do not fill you up.  Eat nutritious foods and avoid empty calories. Empty calories are calories you get from foods or beverages that do not have many vitamins or protein, such as candy, sweets, and  soda. It is better to have a nutritious high-calorie food (such as an avocado) than a food with few nutrients (such as a bag of chips).  Know how many calories are in the foods you eat most often. This will help you calculate calorie counts faster.  Pay attention to calories in drinks. Low-calorie drinks include water and unsweetened drinks.  Pay attention to nutrition labels for "low fat" or "fat free" foods. These foods sometimes have the same amount of calories or more calories than the full fat versions. They also often have added sugar, starch, or salt, to make up for flavor that was removed with the fat.  Find a way of tracking calories that works for you. Get creative. Try different apps or programs if writing down calories does not work for you. What are some portion control tips?  Know how many calories are in a serving. This will help you know how many servings of a certain food you can have.  Use a measuring cup to measure serving sizes. You could also try weighing out portions on a kitchen scale. With time, you will be able to estimate serving sizes for some foods.  Take some time to put servings of different foods on your favorite plates, bowls, and cups so you know what a serving looks like.  Try not to eat straight from a bag or box. Doing this can lead to overeating. Put the amount you would like to eat in a cup or on a plate to make sure you are eating the right portion.  Use smaller plates, glasses, and bowls to prevent overeating.  Try not to multitask (for example, watch TV or use your computer) while eating. If it is time to eat, sit down at a table and enjoy your food. This will help you to know when you are full. It will also help you to be aware of what you are eating and how much you are eating. What are tips for following this plan? Reading food labels  Check the calorie count compared to the serving size. The serving size may be smaller than what you are used to  eating.  Check the source of the calories. Make sure the food you are eating is high in vitamins and protein and low in saturated and trans fats. Shopping  Read nutrition labels while you shop. This will help you make healthy decisions before you decide to purchase your food.  Make a grocery list and stick to it. Cooking  Try to cook your favorite foods in a healthier way. For example, try baking instead of frying.  Use low-fat dairy products. Meal planning  Use more fruits and vegetables. Half of your plate should be   fruits and vegetables.  Include lean proteins like poultry and fish. How do I count calories when eating out?  Ask for smaller portion sizes.  Consider sharing an entree and sides instead of getting your own entree.  If you get your own entree, eat only half. Ask for a box at the beginning of your meal and put the rest of your entree in it so you are not tempted to eat it.  If calories are listed on the menu, choose the lower calorie options.  Choose dishes that include vegetables, fruits, whole grains, low-fat dairy products, and lean protein.  Choose items that are boiled, broiled, grilled, or steamed. Stay away from items that are buttered, battered, fried, or served with cream sauce. Items labeled "crispy" are usually fried, unless stated otherwise.  Choose water, low-fat milk, unsweetened iced tea, or other drinks without added sugar. If you want an alcoholic beverage, choose a lower calorie option such as a glass of wine or light beer.  Ask for dressings, sauces, and syrups on the side. These are usually high in calories, so you should limit the amount you eat.  If you want a salad, choose a garden salad and ask for grilled meats. Avoid extra toppings like bacon, cheese, or fried items. Ask for the dressing on the side, or ask for olive oil and vinegar or lemon to use as dressing.  Estimate how many servings of a food you are given. For example, a serving of  cooked rice is  cup or about the size of half a baseball. Knowing serving sizes will help you be aware of how much food you are eating at restaurants. The list below tells you how big or small some common portion sizes are based on everyday objects: ? 1 oz--4 stacked dice. ? 3 oz--1 deck of cards. ? 1 tsp--1 die. ? 1 Tbsp-- a ping-pong ball. ? 2 Tbsp--1 ping-pong ball. ?  cup-- baseball. ? 1 cup--1 baseball. Summary  Calorie counting means keeping track of how many calories you eat and drink each day. If you eat fewer calories than your body needs, you should lose weight.  A healthy amount of weight to lose per week is usually 1-2 lb (0.5-0.9 kg). This usually means reducing your daily calorie intake by 500-750 calories.  The number of calories in a food can be found on a Nutrition Facts label. If a food does not have a Nutrition Facts label, try to look up the calories online or ask your dietitian for help.  Use your calories on foods and drinks that will fill you up, and not on foods and drinks that will leave you hungry.  Use smaller plates, glasses, and bowls to prevent overeating. This information is not intended to replace advice given to you by your health care provider. Make sure you discuss any questions you have with your health care provider. Document Revised: 06/24/2018 Document Reviewed: 09/04/2016 [employer] Patient Education  2020 [employer]   Fat and Cholesterol Restricted Eating Plan Getting too much fat and cholesterol in your diet may cause health problems. Choosing the right foods helps keep your fat and cholesterol at normal levels. This can keep you from getting certain diseases. Your doctor may recommend an eating plan that includes:  Total fat: ______% or less of total calories a day.  Saturated fat: ______% or less of total calories a day.  Cholesterol: less than _________mg a day.  Fiber: ______g a day. What are tips for following this   plan? Meal  planning  At meals, divide your plate into four equal parts: ? Fill one-half of your plate with vegetables and green salads. ? Fill one-fourth of your plate with whole grains. ? Fill one-fourth of your plate with low-fat (lean) protein foods.  Eat fish that is high in omega-3 fats at least two times a week. This includes mackerel, tuna, sardines, and salmon.  Eat foods that are high in fiber, such as whole grains, beans, apples, broccoli, carrots, peas, and barley. General tips   Work with your doctor to lose weight if you need to.  Avoid: ? Foods with added sugar. ? Fried foods. ? Foods with partially hydrogenated oils.  Limit alcohol intake to no more than 1 drink a day for nonpregnant women and 2 drinks a day for men. One drink equals 12 oz of beer, 5 oz of wine, or 1 oz of hard liquor. Reading food labels  Check food labels for: ? Trans fats. ? Partially hydrogenated oils. ? Saturated fat (g) in each serving. ? Cholesterol (mg) in each serving. ? Fiber (g) in each serving.  Choose foods with healthy fats, such as: ? Monounsaturated fats. ? Polyunsaturated fats. ? Omega-3 fats.  Choose grain products that have whole grains. Look for the word "whole" as the first word in the ingredient list. Cooking  Cook foods using low-fat methods. These include baking, boiling, grilling, and broiling.  Eat more home-cooked foods. Eat at restaurants and buffets less often.  Avoid cooking using saturated fats, such as butter, cream, palm oil, palm kernel oil, and coconut oil. Recommended foods  Fruits  All fresh, canned (in natural juice), or frozen fruits. Vegetables  Fresh or frozen vegetables (raw, steamed, roasted, or grilled). Green salads. Grains  Whole grains, such as whole wheat or whole grain breads, crackers, cereals, and pasta. Unsweetened oatmeal, bulgur, barley, quinoa, or brown rice. Corn or whole wheat flour tortillas. Meats and other protein foods  Ground  beef (85% or leaner), grass-fed beef, or beef trimmed of fat. Skinless chicken or turkey. Ground chicken or turkey. Pork trimmed of fat. All fish and seafood. Egg whites. Dried beans, peas, or lentils. Unsalted nuts or seeds. Unsalted canned beans. Nut butters without added sugar or oil. Dairy  Low-fat or nonfat dairy products, such as skim or 1% milk, 2% or reduced-fat cheeses, low-fat and fat-free ricotta or cottage cheese, or plain low-fat and nonfat yogurt. Fats and oils  Tub margarine without trans fats. Light or reduced-fat mayonnaise and salad dressings. Avocado. Olive, canola, sesame, or safflower oils. The items listed above may not be a complete list of foods and beverages you can eat. Contact a dietitian for more information. Foods to avoid Fruits  Canned fruit in heavy syrup. Fruit in cream or butter sauce. Fried fruit. Vegetables  Vegetables cooked in cheese, cream, or butter sauce. Fried vegetables. Grains  White bread. White pasta. White rice. Cornbread. Bagels, pastries, and croissants. Crackers and snack foods that contain trans fat and hydrogenated oils. Meats and other protein foods  Fatty cuts of meat. Ribs, chicken wings, bacon, sausage, bologna, salami, chitterlings, fatback, hot dogs, bratwurst, and packaged lunch meats. Liver and organ meats. Whole eggs and egg yolks. Chicken and turkey with skin. Fried meat. Dairy  Whole or 2% milk, cream, half-and-half, and cream cheese. Whole milk cheeses. Whole-fat or sweetened yogurt. Full-fat cheeses. Nondairy creamers and whipped toppings. Processed cheese, cheese spreads, and cheese curds. Beverages  Alcohol. Sugar-sweetened drinks such as sodas, lemonade, and fruit   drinks. Fats and oils  Butter, stick margarine, lard, shortening, ghee, or bacon fat. Coconut, palm kernel, and palm oils. Sweets and desserts  Corn syrup, sugars, honey, and molasses. Candy. Jam and jelly. Syrup. Sweetened cereals. Cookies, pies, cakes,  donuts, muffins, and ice cream. The items listed above may not be a complete list of foods and beverages you should avoid. Contact a dietitian for more information. Summary  Choosing the right foods helps keep your fat and cholesterol at normal levels. This can keep you from getting certain diseases.  At meals, fill one-half of your plate with vegetables and green salads.  Eat high-fiber foods, like whole grains, beans, apples, carrots, peas, and barley.  Limit added sugar, saturated fats, alcohol, and fried foods. This information is not intended to replace advice given to you by your health care provider. Make sure you discuss any questions you have with your health care provider. Document Revised: 06/08/2018 Document Reviewed: 06/22/2017 [employer] Patient Education  2020 [employer]  
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
## 2020-03-21 LAB — COMPREHENSIVE METABOLIC PANEL
ALT: 20 IU/L (ref 0–32)
AST: 21 IU/L (ref 0–40)
Albumin/Globulin Ratio: 1.7 (ref 1.2–2.2)
Albumin: 4.4 g/dL (ref 3.9–5.0)
Alkaline Phosphatase: 126 IU/L — ABNORMAL HIGH (ref 48–121)
BUN/Creatinine Ratio: 15 (ref 9–23)
BUN: 9 mg/dL (ref 6–20)
Bilirubin Total: 0.2 mg/dL (ref 0.0–1.2)
CO2: 25 mmol/L (ref 20–29)
Calcium: 9.5 mg/dL (ref 8.7–10.2)
Chloride: 103 mmol/L (ref 96–106)
Creatinine, Ser: 0.62 mg/dL (ref 0.57–1.00)
GFR calc Af Amer: 143 mL/min/{1.73_m2} (ref >59)
GFR calc non Af Amer: 124 mL/min/{1.73_m2} (ref >59)
Globulin, Total: 2.6 g/dL (ref 1.5–4.5)
Glucose: 78 mg/dL (ref 65–99)
Potassium: 4.4 mmol/L (ref 3.5–5.2)
Sodium: 140 mmol/L (ref 134–144)
Total Protein: 7 g/dL (ref 6.0–8.5)

## 2020-03-21 LAB — CBC WITH DIFFERENTIAL/PLATELET
Basophils Absolute: 0.1 10*3/uL (ref 0.0–0.2)
Basos: 1 %
EOS (ABSOLUTE): 0.1 10*3/uL (ref 0.0–0.4)
Eos: 1 %
Hematocrit: 40.2 % (ref 34.0–46.6)
Hemoglobin: 13.3 g/dL (ref 11.1–15.9)
Immature Grans (Abs): 0 10*3/uL (ref 0.0–0.1)
Immature Granulocytes: 0 %
Lymphocytes Absolute: 1.8 10*3/uL (ref 0.7–3.1)
Lymphs: 20 %
MCH: 29 pg (ref 26.6–33.0)
MCHC: 33.1 g/dL (ref 31.5–35.7)
MCV: 88 fL (ref 79–97)
Monocytes Absolute: 0.6 10*3/uL (ref 0.1–0.9)
Monocytes: 7 %
Neutrophils Absolute: 6.3 10*3/uL (ref 1.4–7.0)
Neutrophils: 71 %
Platelets: 342 10*3/uL (ref 150–450)
RBC: 4.58 x10E6/uL (ref 3.77–5.28)
RDW: 12.3 % (ref 11.7–15.4)
WBC: 9 10*3/uL (ref 3.4–10.8)

## 2020-03-21 LAB — VITAMIN D 25 HYDROXY (VIT D DEFICIENCY, FRACTURES): Vit D, 25-Hydroxy: 36.2 ng/mL (ref 30.0–100.0)

## 2020-03-21 LAB — LIPID PANEL W/O CHOL/HDL RATIO
Cholesterol, Total: 149 mg/dL (ref 100–199)
HDL: 51 mg/dL (ref >39)
LDL Chol Calc (NIH): 81 mg/dL (ref 0–99)
Triglycerides: 91 mg/dL (ref 0–149)
VLDL Cholesterol Cal: 17 mg/dL (ref 5–40)

## 2020-03-21 LAB — URINALYSIS, ROUTINE W REFLEX MICROSCOPIC
Bilirubin, UA: NEGATIVE
Glucose, UA: NEGATIVE
Ketones, UA: NEGATIVE
Leukocytes,UA: NEGATIVE
Nitrite, UA: NEGATIVE
Protein,UA: NEGATIVE
RBC, UA: NEGATIVE
Specific Gravity, UA: 1.023 (ref 1.005–1.030)
Urobilinogen, Ur: 0.2 mg/dL (ref 0.2–1.0)
pH, UA: 6 (ref 5.0–7.5)

## 2020-03-21 LAB — TSH: TSH: 1.12 u[IU]/mL (ref 0.450–4.500)

## 2020-03-22 ENCOUNTER — Telehealth: Payer: Self-pay | Admitting: *Deleted

## 2020-03-22 ENCOUNTER — Telehealth: Payer: Self-pay

## 2020-03-22 NOTE — Telephone Encounter (Signed)
Patient was calling to check on the statis of [place] appointment. Please advise?

## 2020-03-22 NOTE — Telephone Encounter (Signed)
Patient was advised of lab results. Expressed understanding. Patient wanted to know if Jessica Mcintosh would be willing to start her on a medication for her weight. Please advise?

## 2020-03-22 NOTE — Progress Notes (Signed)
CBC within normal limits no signs of anemia, or infection.  CMP ok - mild increase in alk phos - a liver enzyme, otherwise within normal limits kidney, liver, electrolytes and glucose. Urinalysis was within normal limits.  Vitamin D within normal limits. Cholesterol is within normal limits. TSH for thyroid is within normal limits.

## 2020-03-22 NOTE — Telephone Encounter (Signed)
She can schedule follow up to discuss weight loss, and medication options and side effects at her convince.  Definitely recommend aggressive lifestyle changes, and dietary changes in the meantime.  Nutrition referral can be placed if she would like.   She should get a call regarding her ultrasound once insurance approves.

## 2020-03-22 NOTE — Telephone Encounter (Signed)
LMOVM for pt to return call 

## 2020-03-22 NOTE — Telephone Encounter (Signed)
Copied from CRM [id number]. Topic: General - Other >> Mar 22, 2020 11:19 AM Wyonia Hough E wrote: Reason for CRM: Pt called about her lab results and to find out information about her ultrasound /please advise

## 2020-03-27 ENCOUNTER — Ambulatory Visit: Payer: Medicaid Other

## 2020-04-08 ENCOUNTER — Ambulatory Visit
Admission: RE | Admit: 2020-04-08 | Discharge: 2020-04-08 | Disposition: A | Discharge status: Home / Self Care | Payer: Medicaid Other | Source: Ambulatory Visit | Attending: Adult Health | Admitting: Adult Health

## 2020-04-08 ENCOUNTER — Other Ambulatory Visit: Payer: Self-pay

## 2020-04-08 DIAGNOSIS — R102 Pelvic and perineal pain: Secondary | ICD-10-CM | POA: Insufficient documentation

## 2020-05-28 DIAGNOSIS — F988 Other specified behavioral and emotional disorders with onset usually occurring in childhood and adolescence: Secondary | ICD-10-CM

## 2020-05-28 HISTORY — DX: Other specified behavioral and emotional disorders with onset usually occurring in childhood and adolescence: F98.8

## 2020-05-30 ENCOUNTER — Ambulatory Visit (INDEPENDENT_AMBULATORY_CARE_PROVIDER_SITE_OTHER): Payer: Medicaid Other | Admitting: Certified Nurse Midwife

## 2020-05-30 ENCOUNTER — Other Ambulatory Visit (HOSPITAL_COMMUNITY)
Admission: RE | Admit: 2020-05-30 | Discharge: 2020-05-30 | Disposition: A | Discharge status: Home / Self Care | Payer: Medicaid Other | Source: Ambulatory Visit | Attending: Certified Nurse Midwife | Admitting: Certified Nurse Midwife

## 2020-05-30 ENCOUNTER — Other Ambulatory Visit: Payer: Self-pay

## 2020-05-30 ENCOUNTER — Encounter: Payer: Self-pay | Admitting: Certified Nurse Midwife

## 2020-05-30 VITALS — BP 108/79 | HR 87 | Ht 62.0 in | Wt 173.7 lb

## 2020-05-30 DIAGNOSIS — N898 Other specified noninflammatory disorders of vagina: Secondary | ICD-10-CM | POA: Insufficient documentation

## 2020-05-30 DIAGNOSIS — Z6831 Body mass index (BMI) 31.0-31.9, adult: Secondary | ICD-10-CM | POA: Diagnosis not present

## 2020-05-30 DIAGNOSIS — Z01419 Encounter for gynecological examination (general) (routine) without abnormal findings: Secondary | ICD-10-CM

## 2020-05-30 DIAGNOSIS — Z124 Encounter for screening for malignant neoplasm of cervix: Secondary | ICD-10-CM | POA: Insufficient documentation

## 2020-05-30 NOTE — Patient Instructions (Addendum)
Vaginitis  Vaginitis is irritation and swelling (inflammation) of the vagina. It happens when normal bacteria and yeast in the vagina grow too much. There are many types of this condition. Treatment will depend on the type you have. Follow these instructions at home: Lifestyle  Keep your vagina area clean and dry. ? Avoid using soap. ? Rinse the area with water.  Do not do the following until your doctor says it is okay: ? Wash and clean out the vagina (douche). ? Use tampons. ? Have sex.  Wipe from front to back after going to the bathroom.  Let air reach your vagina. ? Wear cotton underwear. ? Do not wear:  Underwear while you sleep.  Tight pants.  Thong underwear.  Underwear or nylons without a cotton panel. ? Take off any wet clothing, such as bathing suits, as soon as possible.  Use gentle, non-scented products. Do not use things that can irritate the vagina, such as fabric softeners. Avoid the following products if they are scented: ? Feminine sprays. ? Detergents. ? Tampons. ? Feminine hygiene products. ? Soaps or bubble baths.  Practice safe sex and use condoms. General instructions  Take over-the-counter and prescription medicines only as told by your doctor.  If you were prescribed an antibiotic medicine, take or use it as told by your doctor. Do not stop taking or using the antibiotic even if you start to feel better.  Keep all follow-up visits as told by your doctor. This is important. Contact a doctor if:  You have pain in your belly.  You have a fever.  Your symptoms last for more than 2-3 days. Get help right away if:  You have a fever and your symptoms get worse all of a sudden. Summary  Vaginitis is irritation and swelling of the vagina. It can happen when the normal bacteria and yeast in the vagina grow too much. There are many types.  Treatment will depend on the type you have.  Do not douche, use tampons , or have sex until your health  care provider approves. When you can return to sex, practice safe sex and use condoms. This information is not intended to replace advice given to you by your health care provider. Make sure you discuss any questions you have with your health care provider. Document Revised: 09/17/2017 Document Reviewed: 10/27/2016 Elsevier Patient Education  2020 [place] 7-7 Years Old, Female Preventive care refers to visits with your health care provider and lifestyle choices that can promote health and wellness. This includes:  A yearly physical exam. This may also be called an annual well check.  Regular dental visits and eye exams.  Immunizations.  Screening for certain conditions.  Healthy lifestyle choices, such as eating a healthy diet, getting regular exercise, not using drugs or products that contain nicotine and tobacco, and limiting alcohol use. What can I expect for my preventive care visit? Physical exam Your health care provider will check your:  Height and weight. This may be used to calculate body mass index (BMI), which tells if you are at a healthy weight.  Heart rate and blood pressure.  Skin for abnormal spots. Counseling Your health care provider may ask you questions about your:  Alcohol, tobacco, and drug use.  Emotional well-being.  Home and relationship well-being.  Sexual activity.  Eating habits.  Work and work Statistician.  Method of birth control.  Menstrual cycle.  Pregnancy history. What immunizations do I need?  Influenza (flu)  vaccine  This is recommended every year. Tetanus, diphtheria, and pertussis (Tdap) vaccine  You may need a Td booster every 10 years. Varicella (chickenpox) vaccine  You may need this if you have not been vaccinated. Human papillomavirus (HPV) vaccine  If recommended by your health care provider, you may need three doses over 6 months. Measles, mumps, and rubella (MMR) vaccine  You may  need at least one dose of MMR. You may also need a second dose. Meningococcal conjugate (MenACWY) vaccine  One dose is recommended if you are age 61-21 years and a first-year college student living in a residence hall, or if you have one of several medical conditions. You may also need additional booster doses. Pneumococcal conjugate (PCV13) vaccine  You may need this if you have certain conditions and were not previously vaccinated. Pneumococcal polysaccharide (PPSV23) vaccine  You may need one or two doses if you smoke cigarettes or if you have certain conditions. Hepatitis A vaccine  You may need this if you have certain conditions or if you travel or work in places where you may be exposed to hepatitis A. Hepatitis B vaccine  You may need this if you have certain conditions or if you travel or work in places where you may be exposed to hepatitis B. Haemophilus influenzae type b (Hib) vaccine  You may need this if you have certain conditions. You may receive vaccines as individual doses or as more than one vaccine together in one shot (combination vaccines). Talk with your health care provider about the risks and benefits of combination vaccines. What tests do I need?  Blood tests  Lipid and cholesterol levels. These may be checked every 5 years starting at age 36.  Hepatitis C test.  Hepatitis B test. Screening  Diabetes screening. This is done by checking your blood sugar (glucose) after you have not eaten for a while (fasting).  Sexually transmitted disease (STD) testing.  BRCA-related cancer screening. This may be done if you have a family history of breast, ovarian, tubal, or peritoneal cancers.  Pelvic exam and Pap test. This may be done every 3 years starting at age 68. Starting at age 76, this may be done every 5 years if you have a Pap test in combination with an HPV test. Talk with your health care provider about your test results, treatment options, and if  necessary, the need for more tests. Follow these instructions at home: Eating and drinking   Eat a diet that includes fresh fruits and vegetables, whole grains, lean protein, and low-fat dairy.  Take vitamin and mineral supplements as recommended by your health care provider.  Do not drink alcohol if: ? Your health care provider tells you not to drink. ? You are pregnant, may be pregnant, or are planning to become pregnant.  If you drink alcohol: ? Limit how much you have to 0-1 drink a day. ? Be aware of how much alcohol is in your drink. In the [place] one drink equals one 12 oz bottle of beer (355 mL), one 5 oz glass of wine (148 mL), or one 1 oz glass of hard liquor (44 mL). Lifestyle  Take daily care of your teeth and gums.  Stay active. Exercise for at least 30 minutes on 5 or more days each week.  Do not use any products that contain nicotine or tobacco, such as cigarettes, e-cigarettes, and chewing tobacco. If you need help quitting, ask your health care provider.  If you are sexually active, practice  safe sex. Use a condom or other form of birth control (contraception) in order to prevent pregnancy and STIs (sexually transmitted infections). If you plan to become pregnant, see your health care provider for a preconception visit. What's next?  Visit your health care provider once a year for a well check visit.  Ask your health care provider how often you should have your eyes and teeth checked.  Stay up to date on all vaccines. This information is not intended to replace advice given to you by your health care provider. Make sure you discuss any questions you have with your health care provider. Document Revised: 06/16/2018 Document Reviewed: 06/16/2018 Elsevier Patient Education  2020 [employer].

## 2020-05-30 NOTE — Progress Notes (Signed)
ANNUAL PREVENTATIVE CARE GYN  ENCOUNTER NOTE  Subjective:       Jessica Mcintosh is a 29 y.o. G81P1001 female here for a routine annual gynecologic exam.  Current complaints: 1. Vaginal odor and increased discharge for the last month  Denies difficulty breathing or respiratory distress, chest pain, abdominal pain, excessive vaginal bleeding dysuria, and leg pain or swelling.    Gynecologic History  Patient's last menstrual period was 05/29/2020 (exact date). Period Cycle (Days): 28 Period Duration (Days): 4-10 Period Pattern: (!) Irregular Menstrual Flow: Light, Heavy Menstrual Control: Tampon, Maxi pad Menstrual Control Change Freq (Hours): 2 Dysmenorrhea: (!) Severe Dysmenorrhea Symptoms: Cramping, Nausea, Headache  Contraception: condoms   Last Pap: 04/2017. Results were: normal  Obstetric History  OB History  Gravida Para Term Preterm AB Living  1 1 1     1   SAB TAB Ectopic Multiple Live Births        0 1    # Outcome Date GA Lbr Len/2nd Weight Sex Delivery Anes PTL Lv  1 Term 10/08/17 [redacted]w[redacted]d 03:50 / 00:42 7 lb 4.8 oz (3.31 kg) M Vag-Spont EPI  LIV     Complications: Preeclampsia    Past Medical History:  Diagnosis Date  . ADD (attention deficit disorder) 05/28/2020  . Allergy   . Ovarian cyst   . Preeclampsia 2018    Past Surgical History:  Procedure Laterality Date  . NO PAST SURGERIES      Current Outpatient Medications on File Prior to Visit  Medication Sig Dispense Refill  . ADDERALL XR 10 MG 24 hr capsule Take 10 mg by mouth at bedtime.    2019 ELDERBERRY PO Take by mouth.    Marland Kitchen ibuprofen (ADVIL) 800 MG tablet Take 1 tablet (800 mg total) by mouth every 8 (eight) hours as needed. 60 tablet 1  . ondansetron (ZOFRAN ODT) 4 MG disintegrating tablet Take 1 tablet (4 mg total) by mouth every 6 (six) hours as needed for nausea. 20 tablet 0  . Prenatal Vit-Fe Fumarate-FA (PRENATAL MULTIVITAMIN) TABS tablet Take 1 tablet by mouth daily at 12 noon.     No current  facility-administered medications on file prior to visit.    Allergies  Allergen Reactions  . Aspirin Hives, Itching, Rash, Shortness Of Breath and Swelling  . Penicillins Hives and Rash    Social History   Socioeconomic History  . Marital status: Married    Spouse name: Not on file  . Number of children: Not on file  . Years of education: Not on file  . Highest education level: Not on file  Occupational History  . Not on file  Tobacco Use  . Smoking status: Never Smoker  . Smokeless tobacco: Never Used  Vaping Use  . Vaping Use: Never used  Substance and Sexual Activity  . Alcohol use: No  . Drug use: No  . Sexual activity: Yes    Birth control/protection: Condom  Other Topics Concern  . Not on file  Social History Narrative  . Not on file   Social Determinants of Health   Financial Resource Strain:   . Difficulty of Paying Living Expenses:   Food Insecurity:   . Worried About Marland Kitchen in the Last Year:   . Programme researcher, broadcasting/film/video in the Last Year:   Transportation Needs:   . Barista (Medical):   Freight forwarder Lack of Transportation (Non-Medical):   Physical Activity:   . Days of Exercise per Week:   .  Minutes of Exercise per Session:   Stress:   . Feeling of Stress :   Social Connections:   . Frequency of Communication with Friends and Family:   . Frequency of Social Gatherings with Friends and Family:   . Attends Religious Services:   . Active Member of Clubs or Organizations:   . Attends Banker Meetings:   Marland Kitchen Marital Status:   Intimate Partner Violence:   . Fear of Current or Ex-Partner:   . Emotionally Abused:   Marland Kitchen Physically Abused:   . Sexually Abused:     Family History  Problem Relation Age of Onset  . Depression Mother   . Hypertension Mother   . COPD Mother   . Depression Maternal Aunt   . Hypertension Maternal Aunt   . Cancer Paternal Uncle   . Lung cancer Paternal Uncle   . Breast cancer Maternal Grandmother   .  Depression Maternal Grandmother   . Cancer Maternal Grandfather   . Lung cancer Maternal Grandfather   . Cancer Paternal Grandfather   . Skin cancer Paternal Grandfather   . Alcoholism Father   . Hypertension Father   . Ovarian cancer Neg Hx   . Colon cancer Neg Hx     The following portions of the patient's history were reviewed and updated as appropriate: allergies, current medications, past family history, past medical history, past social history, past surgical history and problem list.  Review of Systems  ROS negative except as noted above. Information obtained from patient.    Objective:   BP 108/79   Pulse 87   Ht 5\' 2"  (1.575 m)   Wt 173 lb 11.2 oz (78.8 kg)   LMP 05/29/2020 (Exact Date)   BMI 31.77 kg/m    CONSTITUTIONAL: Well-developed, well-nourished female in no acute distress.   PSYCHIATRIC: Normal mood and affect. Normal behavior. Normal judgment and thought content.  NEUROLGIC: Alert and oriented to person, place, and time. Normal muscle tone coordination. No cranial nerve deficit noted.  HENT:  Normocephalic, atraumatic, External right and left ear normal.  EYES: Conjunctivae and EOM are normal. Pupils are equal and round.   NECK: Normal range of motion, supple, no masses.  Normal thyroid.   SKIN: Skin is warm and dry. No rash noted. Not diaphoretic. No erythema. No pallor.  CARDIOVASCULAR: Normal heart rate noted, regular rhythm, no murmur.  RESPIRATORY: Clear to auscultation bilaterally. Effort and breath sounds normal, no problems with respiration noted.  BREASTS: Symmetric in size. No masses, skin changes, nipple drainage, or lymphadenopathy.  ABDOMEN: Soft, normal bowel sounds, no distention noted.  No tenderness, rebound or guarding.   PELVIC:  External Genitalia: Normal  Vagina: Normal, swab collected  Cervix: Normal, Pap collected  Uterus: Normal  Adnexa: Normal   MUSCULOSKELETAL: Normal range of motion. No tenderness.  No cyanosis,  clubbing, or edema.  2+ distal pulses.  LYMPHATIC: No Axillary, Supraclavicular, or Inguinal Adenopathy.  Assessment:   Annual gynecologic examination 29 y.o.   Contraception: condoms   Obesity 1   Problem List Items Addressed This Visit    None    Visit Diagnoses    Well woman exam    -  Primary   Relevant Orders   Cytology - PAP   Cervicovaginal ancillary only   Vaginal odor       Relevant Orders   Cervicovaginal ancillary only   Vaginal discharge       Relevant Orders   Cervicovaginal ancillary only   BMI 31.0-31.9,adult  Screening for cervical cancer       Relevant Orders   Cytology - PAP      Plan:   Pap: Pap, Reflex if ASCUS  Labs: Managed by PCP  Routine preventative health maintenance measures emphasized: Exercise/Diet/Weight control, Tobacco Warnings, Alcohol/Substance use risks and Stress Management; see AVS  Sample of pH-D Boric Acid Vaginal Suppositories provided  Reviewed red flag symptoms and when to call  Return to Clinic - 1 Year for [facility] or sooner if needed   Serafina Royals, CNM [facility], [facility] [place] 05/30/20 2:58 PM

## 2020-05-31 DIAGNOSIS — Z01419 Encounter for gynecological examination (general) (routine) without abnormal findings: Secondary | ICD-10-CM | POA: Diagnosis not present

## 2020-06-03 LAB — CERVICOVAGINAL ANCILLARY ONLY
Bacterial Vaginitis (gardnerella): NEGATIVE
Candida Glabrata: NEGATIVE
Candida Vaginitis: POSITIVE — AB
Comment: NEGATIVE
Comment: NEGATIVE
Comment: NEGATIVE

## 2020-06-04 ENCOUNTER — Other Ambulatory Visit: Payer: Self-pay

## 2020-06-04 MED ORDER — FLUCONAZOLE 150 MG PO TABS: 150.0000 mg | ORAL_TABLET | Freq: Every day | ORAL | 1 refills | Status: DC

## 2020-06-04 NOTE — Telephone Encounter (Signed)
My chart message sent informing patient Diflucan sent to pharmacy

## 2020-06-05 LAB — CYTOLOGY - PAP
Comment: NEGATIVE
Diagnosis: UNDETERMINED — AB
High risk HPV: NEGATIVE

## 2020-06-20 ENCOUNTER — Telehealth: Payer: Self-pay

## 2020-06-20 ENCOUNTER — Other Ambulatory Visit: Payer: Self-pay

## 2020-06-20 ENCOUNTER — Telehealth: Payer: Self-pay | Admitting: Certified Nurse Midwife

## 2020-06-20 MED ORDER — FLUCONAZOLE 150 MG PO TABS: 150.0000 mg | ORAL_TABLET | Freq: Every day | ORAL | 0 refills | Status: DC

## 2020-06-20 MED ORDER — METRONIDAZOLE 500 MG PO TABS: 500.0000 mg | ORAL_TABLET | Freq: Two times a day (BID) | ORAL | 0 refills | Status: DC

## 2020-06-20 NOTE — Telephone Encounter (Signed)
mychart message sent

## 2020-06-20 NOTE — Telephone Encounter (Signed)
[facility] called they have some question about the meds that was sent sent over. Lurena Joiner called she is requesting a call back. Please advise

## 2020-06-20 NOTE — Telephone Encounter (Signed)
Message forwarded from Bruno H. Front desk- Returned call to [employer] from [employer].in regards to a prescription. Lurena Joiner was confused on the instructions for the Diflucan Rx originally sent for pt. Informed her to discard that Rx as Serafina Royals, CNM advised me to send Flagyl instead. Lurena Joiner verbalized understanding

## 2020-06-20 NOTE — Telephone Encounter (Signed)
Pt called in and stated that she was told that if the Diflucan didn't help her to please call in and let the provider know. The pt stated she still has symptoms. The pt is requesting something else sent in to the [facility] on [address] please advise

## 2020-07-09 ENCOUNTER — Telehealth: Payer: Self-pay

## 2020-07-09 ENCOUNTER — Telehealth: Payer: Self-pay | Admitting: Certified Nurse Midwife

## 2020-07-09 NOTE — Telephone Encounter (Signed)
Returned pt call. Pt c/o "rotten onion" odor in the vaginal area/ inner thighs. Pt also c/o underarm odor. She is unsure of where it is coming from. 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 Pt states her friend said it may be from her thighs rubbing all day. Pt states she is using summer's eve fragrance free soap. Pt states she will shower and the scent is still there  Pt has been rx'd Diflucan for y/i which helped for a couple days. Pt has tried Monistat otc.Pt states she has slight clear discharge. Advised pt to use chub rub to help with chafing of the thighs, baby powder to absorb moisture, and cotton under garments, per [employer], CMA. Pt aware and verbalized understanding. Pt states she has tried "everything" and will schedule an appointment since it has been going on for about 3 weeks now.

## 2020-07-09 NOTE — Telephone Encounter (Signed)
Patient is having recurrent yeast infections and that she takes the antibiotics and it resolves the issue only for her symptoms to return 2 days later per the patient. Patient has also tried over the counter Monistat and that is not working either. Patient would like to know what she can do for this issue that she is having. Could you please advise? Patient believes this issue could be due to her Ph balance being thrown off?

## 2020-07-26 ENCOUNTER — Other Ambulatory Visit: Payer: Self-pay

## 2020-07-26 ENCOUNTER — Ambulatory Visit (INDEPENDENT_AMBULATORY_CARE_PROVIDER_SITE_OTHER): Payer: Medicaid Other | Admitting: Certified Nurse Midwife

## 2020-07-26 ENCOUNTER — Encounter: Payer: Self-pay | Admitting: Certified Nurse Midwife

## 2020-07-26 ENCOUNTER — Other Ambulatory Visit (HOSPITAL_COMMUNITY)
Admission: RE | Admit: 2020-07-26 | Discharge: 2020-07-26 | Disposition: A | Discharge status: Home / Self Care | Payer: Medicaid Other | Source: Ambulatory Visit | Attending: Certified Nurse Midwife | Admitting: Certified Nurse Midwife

## 2020-07-26 VITALS — BP 123/85 | HR 89 | Ht 62.0 in | Wt 157.9 lb

## 2020-07-26 DIAGNOSIS — N898 Other specified noninflammatory disorders of vagina: Secondary | ICD-10-CM | POA: Diagnosis not present

## 2020-07-26 MED ORDER — FLUCONAZOLE 200 MG PO TABS: 200.0000 mg | ORAL_TABLET | Freq: Every day | ORAL | 1 refills | Status: DC

## 2020-07-26 MED ORDER — IBUPROFEN 800 MG PO TABS: 800.0000 mg | ORAL_TABLET | Freq: Three times a day (TID) | ORAL | 1 refills | Status: DC | PRN

## 2020-07-26 NOTE — Progress Notes (Signed)
GYN ENCOUNTER NOTE  Subjective:       Jessica Mcintosh is a 29 y.o. G57P1001 female is here for gynecologic evaluation of the following issues:  1. Persistent vaginal odor-stops when taking medication, but immediately return upon completion; no other relieving factors  Denies difficulty breathing or respiratory distress, chest pain, abdominal pain, dysuria, excessive vaginal bleeding, and leg pain or swelling.    Gynecologic History  Patient's last menstrual period was 07/22/2020. Period Cycle (Days): 28 Period Duration (Days): 5-9 Period Pattern: Regular Menstrual Flow: Heavy (first 2 days- moderate) Menstrual Control: Tampon, Maxi pad Menstrual Control Change Freq (Hours): 2-3 Dysmenorrhea: (!) Severe Dysmenorrhea Symptoms: Cramping, Headache, Nausea  Contraception: none  Last Pap: 05/2020. Results were: ASC-US/HPV negative  Obstetric History  OB History  Gravida Para Term Preterm AB Living  1 1 1     1   SAB TAB Ectopic Multiple Live Births        0 1    # Outcome Date GA Lbr Len/2nd Weight Sex Delivery Anes PTL Lv  1 Term 10/08/17 [redacted]w[redacted]d 03:50 / 00:42 7 lb 4.8 oz (3.31 kg) M Vag-Spont EPI  LIV     Complications: Preeclampsia    Past Medical History:  Diagnosis Date  . ADD (attention deficit disorder) 05/28/2020  . Allergy   . Ovarian cyst   . Preeclampsia 2018    Past Surgical History:  Procedure Laterality Date  . NO PAST SURGERIES      Current Outpatient Medications on File Prior to Visit  Medication Sig Dispense Refill  . ADDERALL XR 10 MG 24 hr capsule Take 10 mg by mouth at bedtime.    2019 ELDERBERRY PO Take by mouth.    . Multiple Vitamins-Minerals (MULTIVITAMIN ADULTS PO) Take by mouth.    . fluconazole (DIFLUCAN) 150 MG tablet Take 1 tablet (150 mg total) by mouth daily. Repeat in 3 days if needed. (Patient not taking: Reported on 07/26/2020) 2 tablet 0  . ibuprofen (ADVIL) 800 MG tablet Take 1 tablet (800 mg total) by mouth every 8 (eight) hours as  needed. (Patient not taking: Reported on 07/26/2020) 60 tablet 1  . metroNIDAZOLE (FLAGYL) 500 MG tablet Take 1 tablet (500 mg total) by mouth 2 (two) times daily. (Patient not taking: Reported on 07/26/2020) 14 tablet 0  . ondansetron (ZOFRAN ODT) 4 MG disintegrating tablet Take 1 tablet (4 mg total) by mouth every 6 (six) hours as needed for nausea. (Patient not taking: Reported on 07/26/2020) 20 tablet 0  . Prenatal Vit-Fe Fumarate-FA (PRENATAL MULTIVITAMIN) TABS tablet Take 1 tablet by mouth daily at 12 noon. (Patient not taking: Reported on 07/26/2020)     No current facility-administered medications on file prior to visit.    Allergies  Allergen Reactions  . Aspirin Hives, Itching, Rash, Shortness Of Breath and Swelling  . Penicillins Hives and Rash    Social History   Socioeconomic History  . Marital status: Married    Spouse name: Not on file  . Number of children: Not on file  . Years of education: Not on file  . Highest education level: Not on file  Occupational History  . Not on file  Tobacco Use  . Smoking status: Never Smoker  . Smokeless tobacco: Never Used  Vaping Use  . Vaping Use: Never used  Substance and Sexual Activity  . Alcohol use: No  . Drug use: No  . Sexual activity: Yes    Birth control/protection: None  Other Topics Concern  . Not  on file  Social History Narrative  . Not on file   Social Determinants of Health   Financial Resource Strain:   . Difficulty of Paying Living Expenses: Not on file  Food Insecurity:   . Worried About Programme researcher, broadcasting/film/video in the Last Year: Not on file  . Ran Out of Food in the Last Year: Not on file  Transportation Needs:   . Lack of Transportation (Medical): Not on file  . Lack of Transportation (Non-Medical): Not on file  Physical Activity:   . Days of Exercise per Week: Not on file  . Minutes of Exercise per Session: Not on file  Stress:   . Feeling of Stress : Not on file  Social Connections:   . Frequency of  Communication with Friends and Family: Not on file  . Frequency of Social Gatherings with Friends and Family: Not on file  . Attends Religious Services: Not on file  . Active Member of Clubs or Organizations: Not on file  . Attends Banker Meetings: Not on file  . Marital Status: Not on file  Intimate Partner Violence:   . Fear of Current or Ex-Partner: Not on file  . Emotionally Abused: Not on file  . Physically Abused: Not on file  . Sexually Abused: Not on file    Family History  Problem Relation Age of Onset  . Depression Mother   . Hypertension Mother   . COPD Mother   . Depression Maternal Aunt   . Hypertension Maternal Aunt   . Cancer Paternal Uncle   . Lung cancer Paternal Uncle   . Breast cancer Maternal Grandmother   . Depression Maternal Grandmother   . Cancer Maternal Grandfather   . Lung cancer Maternal Grandfather   . Cancer Paternal Grandfather   . Skin cancer Paternal Grandfather   . Alcoholism Father   . Hypertension Father   . Ovarian cancer Neg Hx   . Colon cancer Neg Hx     The following portions of the patient's history were reviewed and updated as appropriate: allergies, current medications, past family history, past medical history, past social history, past surgical history and problem list.  Review of Systems  ROS negative except as noted above. Information obtained from patient.   Objective:   BP 123/85   Pulse 89   Ht 5\' 2"  (1.575 m)   Wt 157 lb 14.4 oz (71.6 kg)   LMP 07/22/2020   BMI 28.88 kg/m    CONSTITUTIONAL: Well-developed, well-nourished female in no acute distress.   PELVIC:  External Genitalia: Normal  Vagina: Normal, swab collected   MUSCULOSKELETAL: Normal range of motion. No tenderness.  No cyanosis, clubbing, or edema.   Assessment:   1. Vaginal discharge  - Candida 6 Species Profile, NAA - Cervicovaginal ancillary only  2. Vaginal itching  - Candida 6 Species Profile, NAA - Cervicovaginal  ancillary only  3. Vaginal odor  - Candida 6 Species Profile, NAA - Cervicovaginal ancillary only     Plan:   Vaginal swab collected, see orders.   Discussed home treatment measures.   Reviewed red flag symptoms and when to call.  RTC if symptoms worsen or fail to improve.    09/21/2020, CNM [facility], [facility]

## 2020-07-26 NOTE — Patient Instructions (Signed)
Vaginitis Vaginitis is a condition in which the vaginal tissue swells and becomes red (inflamed). This condition is most often caused by a change in the normal balance of bacteria and yeast that live in the vagina. This change causes an overgrowth of certain bacteria or yeast, which causes the inflammation. There are different types of vaginitis, but the most common types are:  Bacterial vaginosis.  Yeast infection (candidiasis).  Trichomoniasis vaginitis. This is a sexually transmitted disease (STD).  Viral vaginitis.  Atrophic vaginitis.  Allergic vaginitis. What are the causes? The cause of this condition depends on the type of vaginitis. It can be caused by:  Bacteria (bacterial vaginosis).  Yeast, which is a fungus (yeast infection).  A parasite (trichomoniasis vaginitis).  A virus (viral vaginitis).  Low hormone levels (atrophic vaginitis). Low hormone levels can occur during pregnancy, breastfeeding, or after menopause.  Irritants, such as bubble baths, scented tampons, and feminine sprays (allergic vaginitis). Other factors can change the normal balance of the yeast and bacteria that live in the vagina. These include:  Antibiotic medicines.  Poor hygiene.  Diaphragms, vaginal sponges, spermicides, birth control pills, and intrauterine devices (IUD).  Sex.  Infection.  Uncontrolled diabetes.  A weakened defense (immune) system. What increases the risk? This condition is more likely to develop in women who:  Smoke.  Use vaginal douches, scented tampons, or scented sanitary pads.  Wear tight-fitting pants.  Wear thong underwear.  Use oral birth control pills or an IUD.  Have sex without a condom.  Have multiple sex partners.  Have an STD.  Frequently use the spermicide nonoxynol-9.  Eat lots of foods high in sugar.  Have uncontrolled diabetes.  Have low estrogen levels.  Have a weakened immune system from an immune disorder or medical  treatment.  Are pregnant or breastfeeding. What are the signs or symptoms? Symptoms vary depending on the cause of the vaginitis. Common symptoms include:  Abnormal vaginal discharge. ? The discharge is white, gray, or yellow with bacterial vaginosis. ? The discharge is thick, white, and cheesy with a yeast infection. ? The discharge is frothy and yellow or greenish with trichomoniasis.  A bad vaginal smell. The smell is fishy with bacterial vaginosis.  Vaginal itching, pain, or swelling.  Sex that is painful.  Pain or burning when urinating. Sometimes there are no symptoms. How is this diagnosed? This condition is diagnosed based on your symptoms and medical history. A physical exam, including a pelvic exam, will also be done. You may also have other tests, including:  Tests to determine the pH level (acidity or alkalinity) of your vagina.  A whiff test, to assess the odor that results when a sample of your vaginal discharge is mixed with a potassium hydroxide solution.  Tests of vaginal fluid. A sample will be examined under a microscope. How is this treated? Treatment varies depending on the type of vaginitis you have. Your treatment may include:  Antibiotic creams or pills to treat bacterial vaginosis and trichomoniasis.  Antifungal medicines, such as vaginal creams or suppositories, to treat a yeast infection.  Medicine to ease discomfort if you have viral vaginitis. Your sexual partner should also be treated.  Estrogen delivered in a cream, pill, suppository, or vaginal ring to treat atrophic vaginitis. If vaginal dryness occurs, lubricants and moisturizing creams may help. You may need to avoid scented soaps, sprays, or douches.  Stopping use of a product that is causing allergic vaginitis. Then using a vaginal cream to treat the symptoms. Follow   these instructions at home: Lifestyle  Keep your genital area clean and dry. Avoid soap, and only rinse the area with  water.  Do not douche or use tampons until your health care provider says it is okay to do so. Use sanitary pads, if needed.  Do not have sex until your health care provider approves. When you can return to sex, practice safe sex and use condoms.  Wipe from front to back. This avoids the spread of bacteria from the rectum to the vagina. General instructions  Take over-the-counter and prescription medicines only as told by your health care provider.  If you were prescribed an antibiotic medicine, take or use it as told by your health care provider. Do not stop taking or using the antibiotic even if you start to feel better.  Keep all follow-up visits as told by your health care provider. This is important. How is this prevented?  Use mild, non-scented products. Do not use things that can irritate the vagina, such as fabric softeners. Avoid the following products if they are scented: ? Feminine sprays. ? Detergents. ? Tampons. ? Feminine hygiene products. ? Soaps or bubble baths.  Let air reach your genital area. ? Wear cotton underwear to reduce moisture buildup. ? Avoid wearing underwear while you sleep. ? Avoid wearing tight pants and underwear or nylons without a cotton panel. ? Avoid wearing thong underwear.  Take off any wet clothing, such as bathing suits, as soon as possible.  Practice safe sex and use condoms. Contact a health care provider if:  You have abdominal pain.  You have a fever.  You have symptoms that last for more than 2-3 days. Get help right away if:  You have a fever and your symptoms suddenly get worse. Summary  Vaginitis is a condition in which the vaginal tissue becomes inflamed.This condition is most often caused by a change in the normal balance of bacteria and yeast that live in the vagina.  Treatment varies depending on the type of vaginitis you have.  Do not douche, use tampons , or have sex until your health care provider approves. When  you can return to sex, practice safe sex and use condoms. This information is not intended to replace advice given to you by your health care provider. Make sure you discuss any questions you have with your health care provider. Document Revised: 09/17/2017 Document Reviewed: 11/10/2016 Elsevier Patient Education  2020 [employer]  

## 2020-07-30 LAB — CERVICOVAGINAL ANCILLARY ONLY
Bacterial Vaginitis (gardnerella): NEGATIVE
Candida Glabrata: NEGATIVE
Candida Vaginitis: NEGATIVE
Chlamydia: NEGATIVE
Comment: NEGATIVE
Comment: NEGATIVE
Comment: NEGATIVE
Comment: NEGATIVE
Comment: NEGATIVE
Comment: NORMAL
Neisseria Gonorrhea: NEGATIVE
Trichomonas: NEGATIVE

## 2020-07-30 LAB — CANDIDA 6 SPECIES PROFILE, NAA
C PARAPSILOSIS/TROPICALIS: NEGATIVE
Candida albicans, NAA: NEGATIVE
Candida glabrata, NAA: NEGATIVE
Candida krusei, NAA: NEGATIVE
Candida lusitaniae, NAA: NEGATIVE

## 2020-07-31 ENCOUNTER — Other Ambulatory Visit: Payer: Self-pay

## 2020-08-07 LAB — GENITAL MYCOPLASMAS NAA, SWAB
Mycoplasma genitalium NAA: NEGATIVE
Mycoplasma hominis NAA: NEGATIVE
Ureaplasma spp NAA: POSITIVE — AB

## 2020-08-07 LAB — SPECIMEN STATUS REPORT

## 2020-08-08 ENCOUNTER — Other Ambulatory Visit: Payer: Self-pay | Admitting: Certified Nurse Midwife

## 2020-08-08 DIAGNOSIS — Z2239 Carrier of other specified bacterial diseases: Secondary | ICD-10-CM

## 2020-08-08 MED ORDER — DOXYCYCLINE HYCLATE 100 MG PO CAPS: 100.0000 mg | ORAL_CAPSULE | Freq: Two times a day (BID) | ORAL | 0 refills | Status: DC

## 2020-08-08 NOTE — Progress Notes (Signed)
Rx Doxycyline, see orders.    Serafina Royals, CNM [facility], [facility] 08/08/20 8:30 AM

## 2020-10-11 ENCOUNTER — Ambulatory Visit
Admission: EM | Admit: 2020-10-11 | Discharge: 2020-10-11 | Disposition: A | Discharge status: Home / Self Care | Payer: Medicaid Other | Attending: Emergency Medicine | Admitting: Emergency Medicine

## 2020-10-11 ENCOUNTER — Encounter: Payer: Self-pay | Admitting: Emergency Medicine

## 2020-10-11 ENCOUNTER — Other Ambulatory Visit: Payer: Self-pay

## 2020-10-11 DIAGNOSIS — N3001 Acute cystitis with hematuria: Secondary | ICD-10-CM | POA: Insufficient documentation

## 2020-10-11 LAB — POCT URINALYSIS DIP (MANUAL ENTRY)
Glucose, UA: 250 mg/dL — AB
Nitrite, UA: POSITIVE — AB
Protein Ur, POC: >=300 mg/dL — AB
Spec Grav, UA: <=1.005 — AB (ref 1.010–1.025)
Urobilinogen, UA: >=8 E.U./dL — AB
pH, UA: 5 (ref 5.0–8.0)

## 2020-10-11 LAB — POCT URINE PREGNANCY: Preg Test, Ur: NEGATIVE

## 2020-10-11 MED ORDER — NITROFURANTOIN MONOHYD MACRO 100 MG PO CAPS: 100.0000 mg | ORAL_CAPSULE | Freq: Two times a day (BID) | ORAL | 0 refills | Status: AC

## 2020-10-11 NOTE — Discharge Instructions (Addendum)
Your urine is consistent with UTI today.  Complete course of antibiotics.  Drink plenty of water to empty bladder regularly. Avoid alcohol and caffeine as these may irritate the bladder.   You may use AZO as needed for pain with urination.  If symptoms worsen or do not improve in the next week to return to be seen or to follow up with your PCP.

## 2020-10-11 NOTE — ED Provider Notes (Signed)
Renaldo Fiddler    CSN: [id number] Arrival date & time: 10/11/20  1039      History   Chief Complaint Chief Complaint  Patient presents with  . Dysuria  . Urinary Urgency    HPI Jessica Mcintosh is a 29 y.o. female.   [facility] presents with complaints of urinary frequency, slight dysuria, and small amount of blood to urine, as well as low back and some pelvic pain which started this morning. Took AZO around 0900. Mild nausea initially. This has resolved. No vomiting or diarrhea. No fevers. Has had UTIs in the past as well as kidney stones.    ROS per HPI, negative if not otherwise mentioned.      Past Medical History:  Diagnosis Date  . ADD (attention deficit disorder) 05/28/2020  . Allergy   . Ovarian cyst   . Preeclampsia 2018    Patient Active Problem List   Diagnosis Date Noted  . Tenderness of female pelvic organs 03/20/2020  . Vitamin D deficiency 03/20/2020  . Fatigue 03/20/2020  . Weight gain 03/20/2020  . Body mass index (BMI) of 31.0-31.9 in adult 03/20/2020  . Screening for blood or protein in urine 03/20/2020  . Dysmenorrhea 05/30/2019  . History of gestational hypertension 10/07/2017    Past Surgical History:  Procedure Laterality Date  . NO PAST SURGERIES      OB History    Gravida  1   Para  1   Term  1   Preterm      AB      Living  1     SAB      IAB      Ectopic      Multiple  0   Live Births  1            Home Medications    Prior to Admission medications   Medication Sig Start Date End Date Taking? Authorizing Provider  ADDERALL XR 10 MG 24 hr capsule Take 10 mg by mouth at bedtime. 05/28/20   [provider]  doxycycline (VIBRAMYCIN) 100 MG capsule Take 1 capsule (100 mg total) by mouth 2 (two) times daily. 08/08/20   Lawhorn, Vanessa [place], CNM  ELDERBERRY PO Take by mouth.    [provider]  fluconazole (DIFLUCAN) 150 MG tablet Take 1 tablet (150 mg total) by mouth  daily. Repeat in 3 days if needed. Patient not taking: No sig reported 06/20/20   Gunnar Bulla, CNM  fluconazole (DIFLUCAN) 200 MG tablet Take 1 tablet (200 mg total) by mouth daily. Take two tablets on first day, then one tablet daily to complete 14 day regimen 07/26/20   Lawhorn, Vanessa [place], CNM  ibuprofen (ADVIL) 800 MG tablet Take 1 tablet (800 mg total) by mouth every 8 (eight) hours as needed. Patient not taking: Reported on 07/26/2020 05/30/19   Gunnar Bulla, CNM  ibuprofen (ADVIL) 800 MG tablet Take 1 tablet (800 mg total) by mouth every 8 (eight) hours as needed. 07/26/20   Lawhorn, Vanessa [place], CNM  metroNIDAZOLE (FLAGYL) 500 MG tablet Take 1 tablet (500 mg total) by mouth 2 (two) times daily. Patient not taking: No sig reported 06/20/20   Gunnar Bulla, CNM  Multiple Vitamins-Minerals (MULTIVITAMIN ADULTS PO) Take by mouth.    [provider]  nitrofurantoin, macrocrystal-monohydrate, (MACROBID) 100 MG capsule Take 1 capsule (100 mg total) by mouth 2 (two) times daily for 5 days. 10/11/20 10/16/20  Georgetta Haber, NP  Family History Family History  Problem Relation Age of Onset  . Depression Mother   . Hypertension Mother   . COPD Mother   . Depression Maternal Aunt   . Hypertension Maternal Aunt   . Cancer Paternal Uncle   . Lung cancer Paternal Uncle   . Breast cancer Maternal Grandmother   . Depression Maternal Grandmother   . Cancer Maternal Grandfather   . Lung cancer Maternal Grandfather   . Cancer Paternal Grandfather   . Skin cancer Paternal Grandfather   . Alcoholism Father   . Hypertension Father   . Ovarian cancer Neg Hx   . Colon cancer Neg Hx     Social History Social History   Tobacco Use  . Smoking status: Never Smoker  . Smokeless tobacco: Never Used  Vaping Use  . Vaping Use: Never used  Substance Use Topics  . Alcohol use: No  . Drug use: No     Allergies   Aspirin and  Penicillins   Review of Systems Review of Systems   Physical Exam Triage Vital Signs ED Triage Vitals  Enc Vitals Group     BP 10/11/20 1104 127/75     Pulse Rate 10/11/20 1104 92     Resp 10/11/20 1104 16     Temp 10/11/20 1104 99 F (37.2 C)     Temp Source 10/11/20 1104 Oral     SpO2 10/11/20 1104 96 %     Weight --      Height 10/11/20 1106 5\' 2"  (1.575 m)     Head Circumference --      Peak Flow --      Pain Score 10/11/20 1103 7     Pain Loc --      Pain Edu? --      Excl. in GC? --    No data found.  Updated Vital Signs BP 127/75 (BP Location: Left Arm)   Pulse 92   Temp 99 F (37.2 C) (Oral)   Resp 16   Ht 5\' 2"  (1.575 m)   LMP 09/18/2020   SpO2 96%   BMI 28.88 kg/m   Visual Acuity Right Eye Distance:   Left Eye Distance:   Bilateral Distance:    Right Eye Near:   Left Eye Near:    Bilateral Near:     Physical Exam Constitutional:      General: She is not in acute distress.    Appearance: She is well-developed.  Cardiovascular:     Rate and Rhythm: Normal rate.  Pulmonary:     Effort: Pulmonary effort is normal.  Abdominal:     Tenderness: There is no abdominal tenderness. There is no right CVA tenderness or left CVA tenderness.  Skin:    General: Skin is warm and dry.  Neurological:     Mental Status: She is alert and oriented to person, place, and time.      UC Treatments / Results  Labs (all labs ordered are listed, but only abnormal results are displayed) Labs Reviewed  POCT URINALYSIS DIP (MANUAL ENTRY) - Abnormal; Notable for the following components:      Result Value   Color, UA orange (*)    Clarity, UA cloudy (*)    Glucose, UA =250 (*)    Bilirubin, UA moderate (*)    Ketones, POC UA small (15) (*)    Spec Grav, UA <=1.005 (*)    Blood, UA large (*)    Protein Ur, POC >=300 (*)  Urobilinogen, UA >=8.0 (*)    Nitrite, UA Positive (*)    Leukocytes, UA Large (3+) (*)    All other components within normal limits   URINE CULTURE  POCT URINE PREGNANCY    EKG   Radiology No results found.  Procedures Procedures (including critical care time)  Medications Ordered in UC Medications - No data to display  Initial Impression / Assessment and Plan / UC Course  I have reviewed the triage vital signs and the nursing notes.  Pertinent labs & imaging results that were available during my care of the patient were reviewed by me and considered in my medical decision making (see chart for details).    Nitrite and leuks (did take azo today as well which appears to further have contaminated sample), consistent with UTI with antibiotics provided. No obvious indication of kidney stone. Return precautions provided. Patient verbalized understanding and agreeable to plan.   Final Clinical Impressions(s) / UC Diagnoses   Final diagnoses:  Acute cystitis with hematuria     Discharge Instructions     Your urine is consistent with UTI today.  Complete course of antibiotics.  Drink plenty of water to empty bladder regularly. Avoid alcohol and caffeine as these may irritate the bladder.   You may use AZO as needed for pain with urination.  If symptoms worsen or do not improve in the next week to return to be seen or to follow up with your PCP.      ED Prescriptions    Medication Sig Dispense Auth. Provider   nitrofurantoin, macrocrystal-monohydrate, (MACROBID) 100 MG capsule Take 1 capsule (100 mg total) by mouth 2 (two) times daily for 5 days. 10 capsule Georgetta Haber, NP     PDMP not reviewed this encounter.   Georgetta Haber, NP 10/11/20 1339

## 2020-10-11 NOTE — ED Triage Notes (Signed)
Patient in office  Today with Urgency,freq. And painful urination x this morning after wiping seen some clot and blood.  Stated she has had kidney stone at the age of 15  OTC: AZO  Denies: fever

## 2020-10-12 LAB — URINE CULTURE: Culture: <10000 — AB

## 2021-06-02 ENCOUNTER — Encounter: Payer: Medicaid Other | Admitting: Certified Nurse Midwife

## 2021-06-03 ENCOUNTER — Encounter: Payer: Medicaid Other | Admitting: Certified Nurse Midwife

## 2021-06-03 ENCOUNTER — Encounter: Payer: Self-pay | Admitting: Certified Nurse Midwife

## 2021-06-05 ENCOUNTER — Other Ambulatory Visit: Payer: Self-pay

## 2021-06-05 ENCOUNTER — Emergency Department: Payer: Medicaid Other

## 2021-06-05 ENCOUNTER — Observation Stay
Admission: EM | Admit: 2021-06-05 | Discharge: 2021-06-07 | DRG: 060 | Disposition: A | Discharge status: Home / Self Care | Payer: Medicaid Other | Attending: Obstetrics and Gynecology | Admitting: Obstetrics and Gynecology

## 2021-06-05 ENCOUNTER — Encounter: Payer: Self-pay | Admitting: Emergency Medicine

## 2021-06-05 DIAGNOSIS — M549 Dorsalgia, unspecified: Secondary | ICD-10-CM | POA: Diagnosis present

## 2021-06-05 DIAGNOSIS — Z20822 Contact with and (suspected) exposure to covid-19: Secondary | ICD-10-CM | POA: Diagnosis present

## 2021-06-05 DIAGNOSIS — Z808 Family history of malignant neoplasm of other organs or systems: Secondary | ICD-10-CM | POA: Diagnosis not present

## 2021-06-05 DIAGNOSIS — Z803 Family history of malignant neoplasm of breast: Secondary | ICD-10-CM

## 2021-06-05 DIAGNOSIS — Z6372 Alcoholism and drug addiction in family: Secondary | ICD-10-CM | POA: Diagnosis not present

## 2021-06-05 DIAGNOSIS — F909 Attention-deficit hyperactivity disorder, unspecified type: Secondary | ICD-10-CM | POA: Diagnosis present

## 2021-06-05 DIAGNOSIS — Z801 Family history of malignant neoplasm of trachea, bronchus and lung: Secondary | ICD-10-CM

## 2021-06-05 DIAGNOSIS — G43909 Migraine, unspecified, not intractable, without status migrainosus: Secondary | ICD-10-CM | POA: Diagnosis not present

## 2021-06-05 DIAGNOSIS — Z8249 Family history of ischemic heart disease and other diseases of the circulatory system: Secondary | ICD-10-CM | POA: Diagnosis not present

## 2021-06-05 DIAGNOSIS — R202 Paresthesia of skin: Secondary | ICD-10-CM

## 2021-06-05 DIAGNOSIS — G379 Demyelinating disease of central nervous system, unspecified: Secondary | ICD-10-CM

## 2021-06-05 DIAGNOSIS — Z811 Family history of alcohol abuse and dependence: Secondary | ICD-10-CM

## 2021-06-05 DIAGNOSIS — Z79899 Other long term (current) drug therapy: Secondary | ICD-10-CM

## 2021-06-05 DIAGNOSIS — Z880 Allergy status to penicillin: Secondary | ICD-10-CM | POA: Diagnosis not present

## 2021-06-05 DIAGNOSIS — R29818 Other symptoms and signs involving the nervous system: Secondary | ICD-10-CM | POA: Diagnosis present

## 2021-06-05 DIAGNOSIS — Z818 Family history of other mental and behavioral disorders: Secondary | ICD-10-CM

## 2021-06-05 DIAGNOSIS — G35 Multiple sclerosis: Secondary | ICD-10-CM | POA: Diagnosis present

## 2021-06-05 DIAGNOSIS — Z825 Family history of asthma and other chronic lower respiratory diseases: Secondary | ICD-10-CM | POA: Diagnosis not present

## 2021-06-05 DIAGNOSIS — R200 Anesthesia of skin: Secondary | ICD-10-CM

## 2021-06-05 DIAGNOSIS — R29898 Other symptoms and signs involving the musculoskeletal system: Secondary | ICD-10-CM

## 2021-06-05 DIAGNOSIS — Z886 Allergy status to analgesic agent status: Secondary | ICD-10-CM | POA: Diagnosis not present

## 2021-06-05 LAB — URINALYSIS, COMPLETE (UACMP) WITH MICROSCOPIC
Bilirubin Urine: NEGATIVE
Glucose, UA: NEGATIVE mg/dL
Hgb urine dipstick: NEGATIVE
Ketones, ur: NEGATIVE mg/dL
Nitrite: NEGATIVE
Protein, ur: NEGATIVE mg/dL
Specific Gravity, Urine: 1.005 (ref 1.005–1.030)
pH: 6 (ref 5.0–8.0)

## 2021-06-05 LAB — PREGNANCY, URINE: Preg Test, Ur: NEGATIVE

## 2021-06-05 IMAGING — CR DG LUMBAR SPINE 2-3V
1 series · 3 of 3 positions shown · non-contrast
Comparison: None.

CLINICAL DATA: Right leg numbness and back pain.

EXAM:
LUMBAR SPINE - 2-3 VIEW

[Series 1: dg lumbar spine 2-3 views · 0.14mm/px · 3 of 3 slices shown]
[im 1/3]
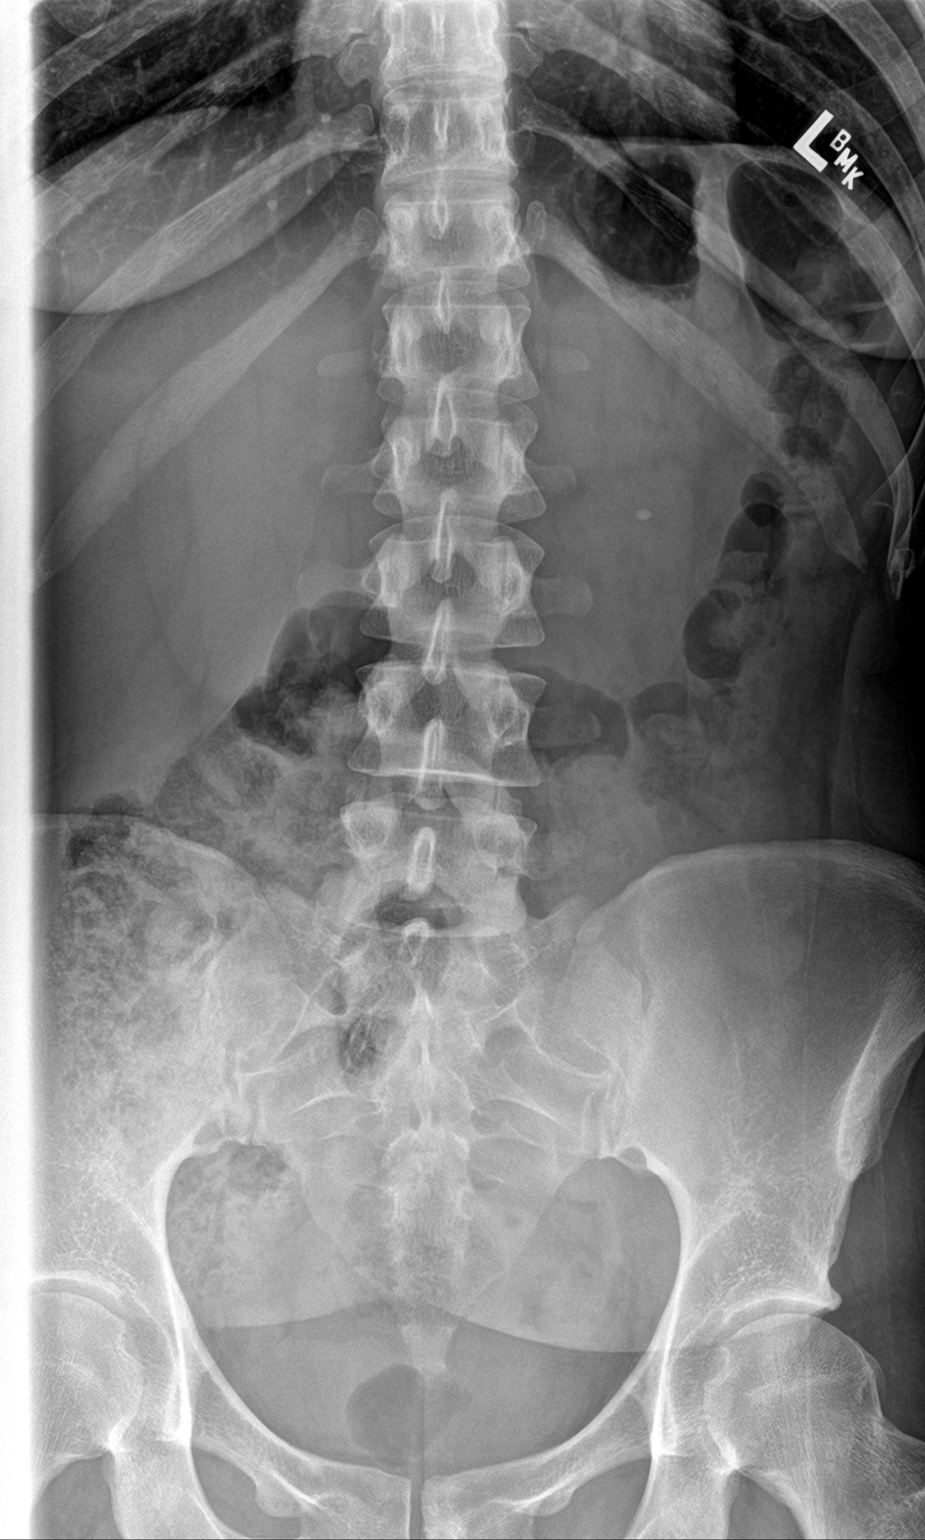
[im 2/3]
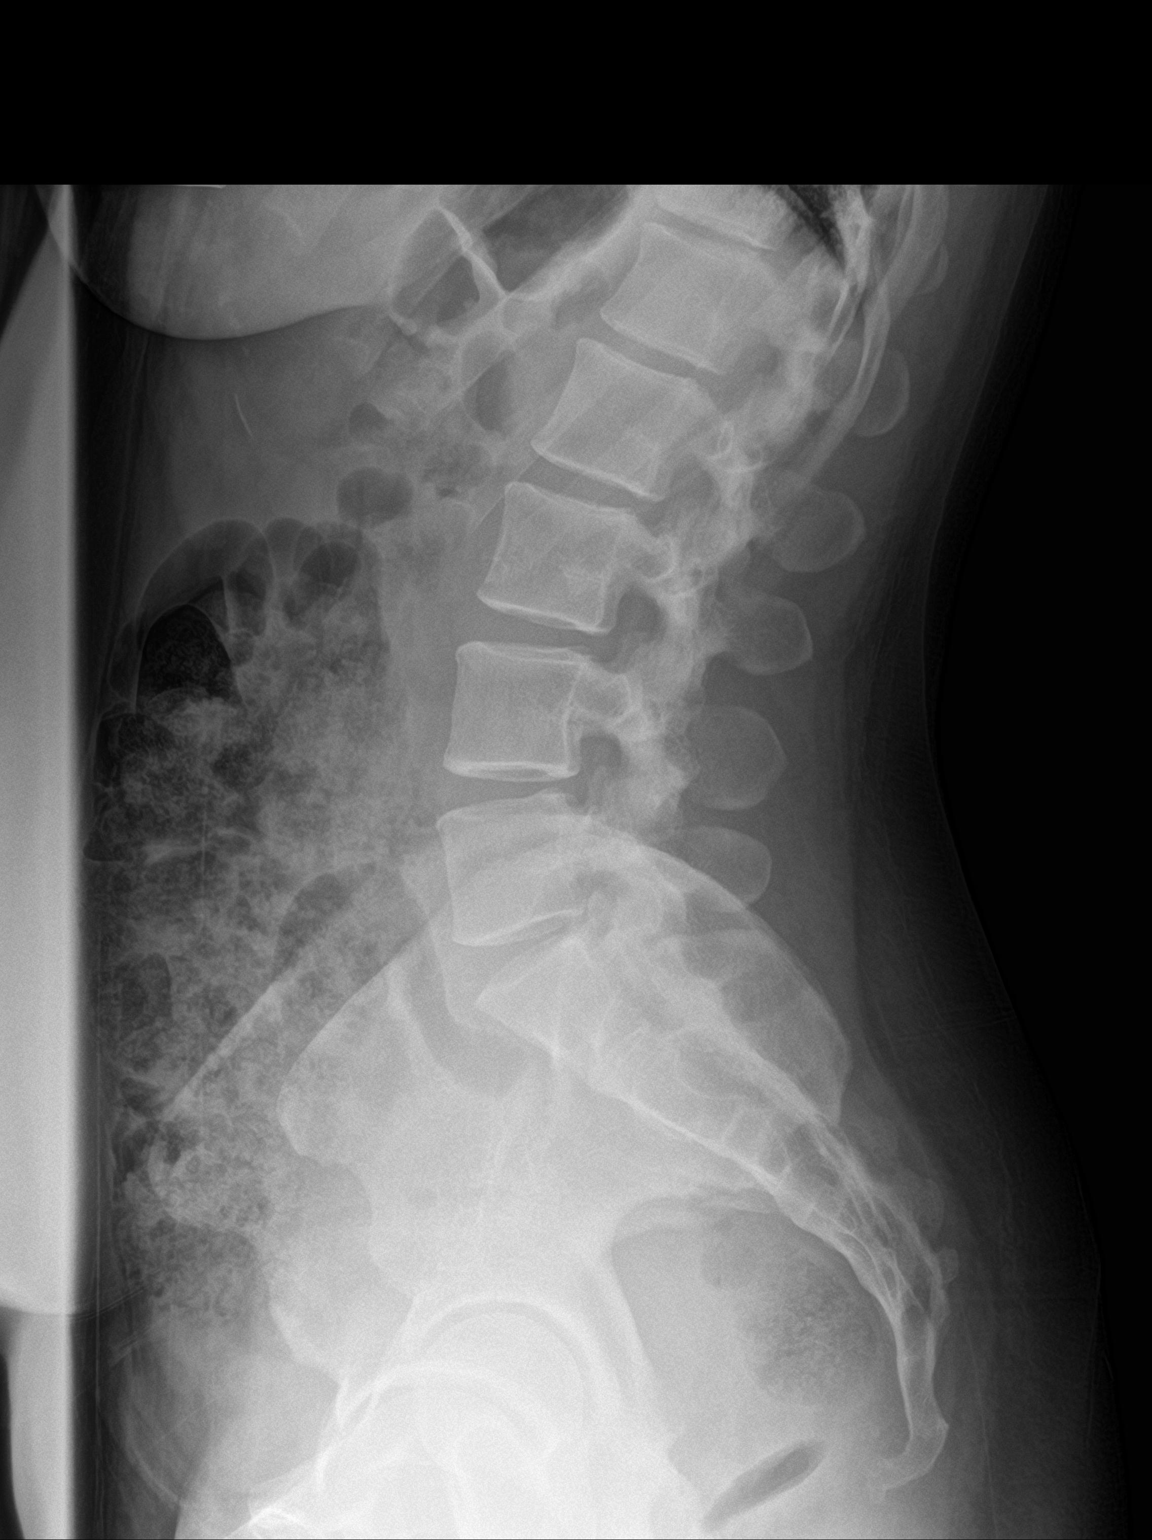
[im 3/3]
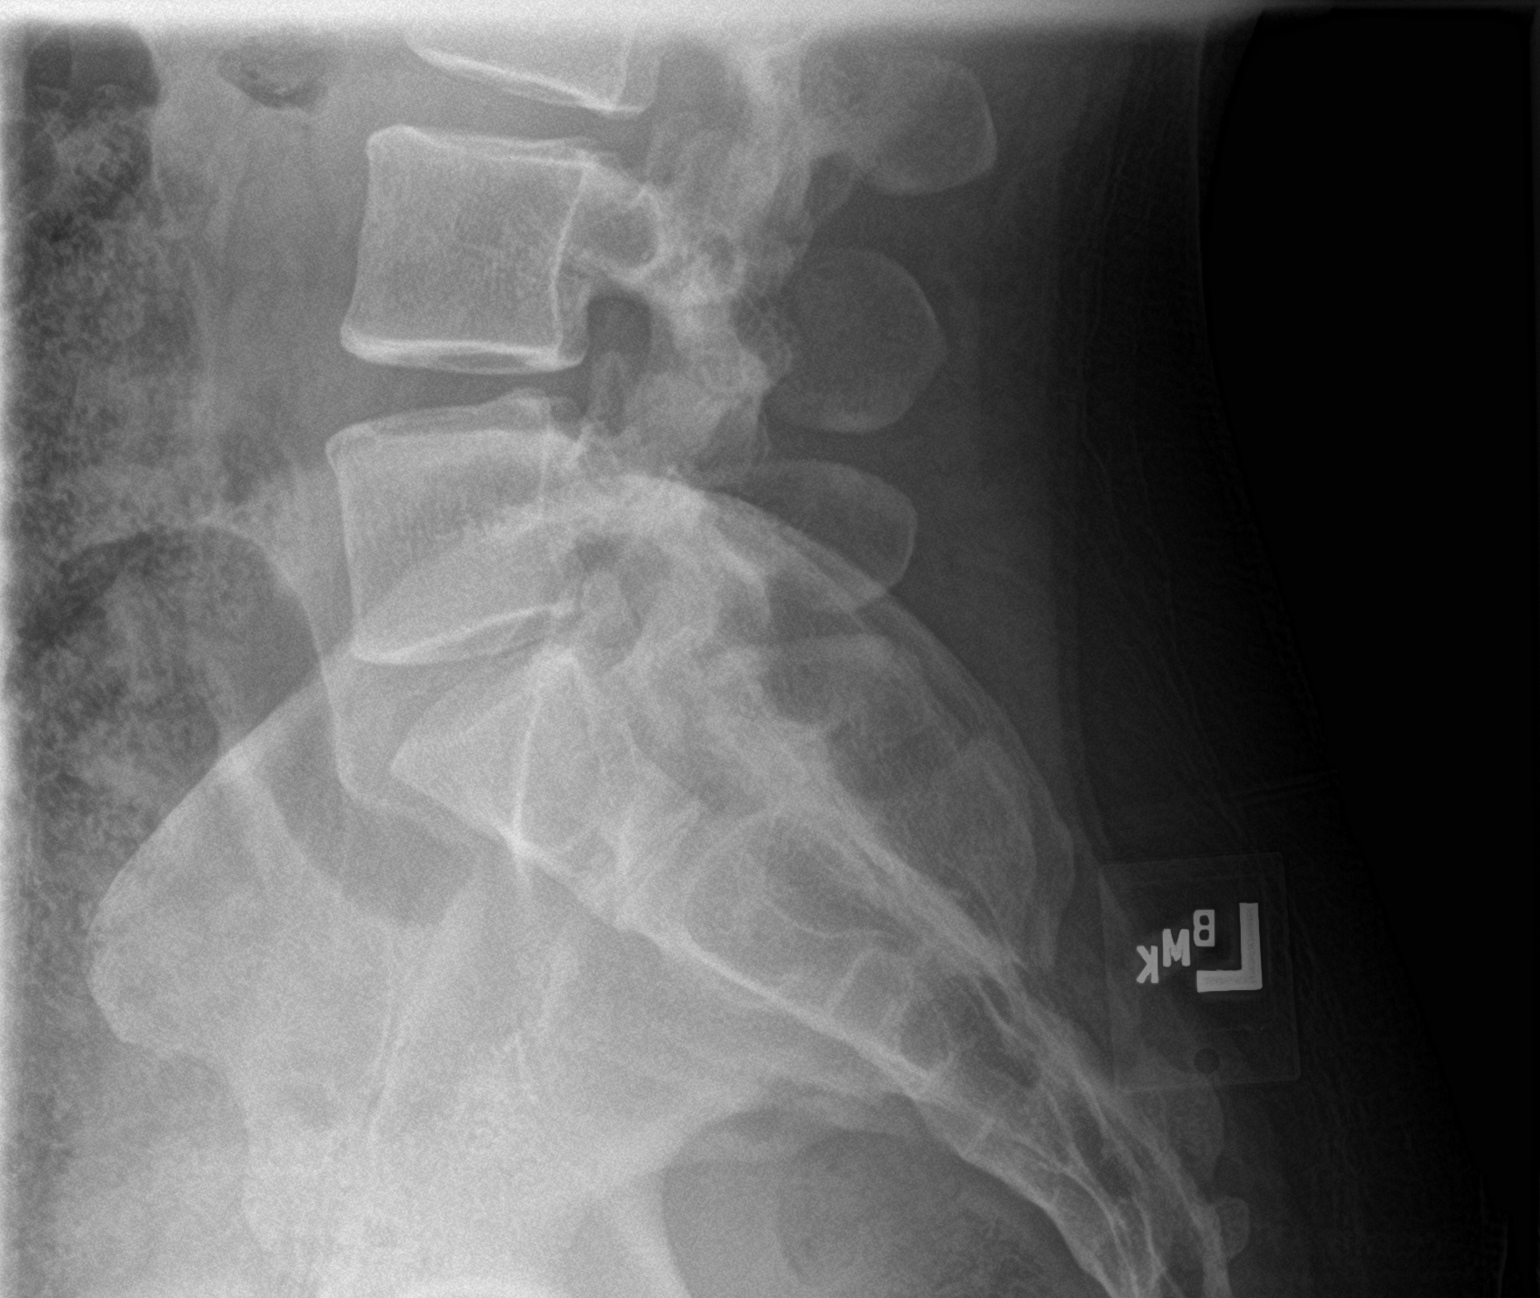

[3 of 3 positions shown; findings below may reference images not displayed]

FINDINGS: Five lumbar type vertebra. There is no acute fracture or subluxation
of lumbar spine. The vertebral body heights and disc spaces are
maintained. The visualized posterior elements are intact. A 3 mm
radiopaque focus over the left flank may represent a kidney stone.
The soft tissues are unremarkable.
IMPRESSION: 1. No acute/traumatic lumbar spine pathology.
2. Possible 3 mm left renal calculus.

## 2021-06-05 IMAGING — MR MR LUMBAR SPINE W/O CM
5 series · 34 of 48 positions shown · non-contrast
Comparison: None.

CLINICAL DATA: Low back pain radiating to the right leg

EXAM:
MRI LUMBAR SPINE WITHOUT CONTRAST
TECHNIQUE: Multiplanar, multisequence MR imaging of the lumbar spine was
performed. No intravenous contrast was administered.

[Series 5: T2 · sagittal · 4.0mm · 0.81mm/px · 8 of 17 slices shown (1 of 2)]
[im 1/17]
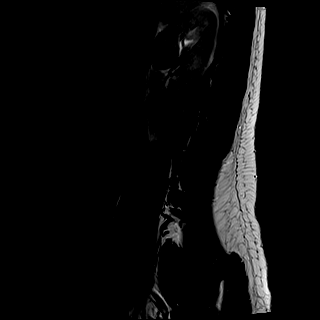
[im 3/17]
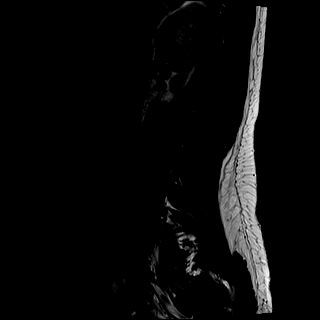
[im 5/17]
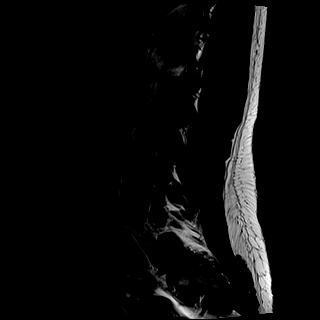
[im 7/17]
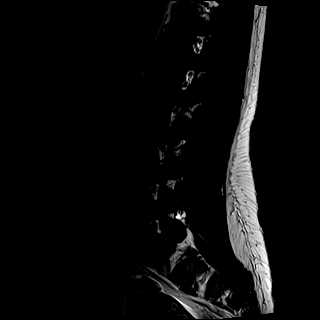
[im 10/17]
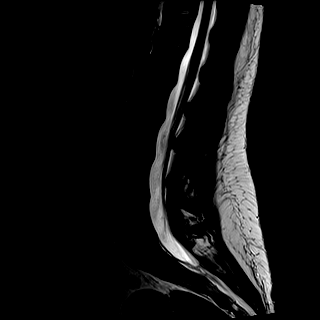
[im 12/17]
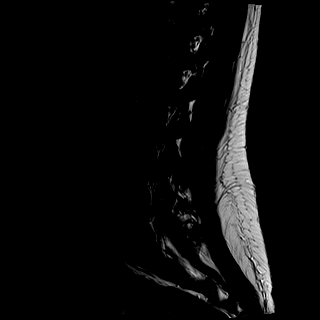
[im 14/17]
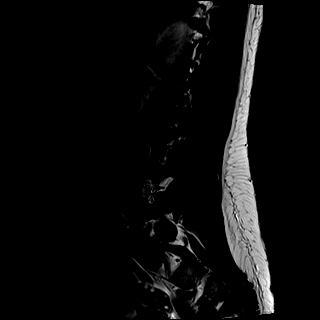
[im 17/17]
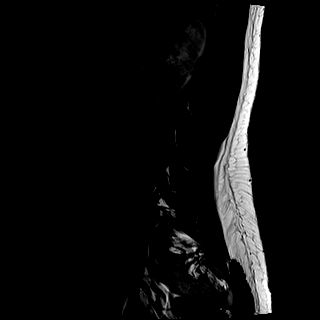

[Series 6: T1 · sagittal · 4.0mm · 0.81mm/px · 7 of 17 slices shown (1 of 2)]
[im 1/17]
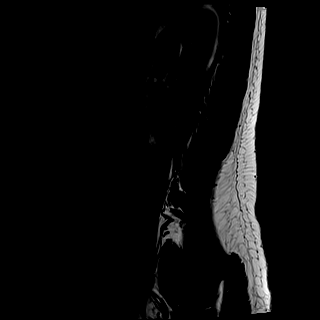
[im 3/17]
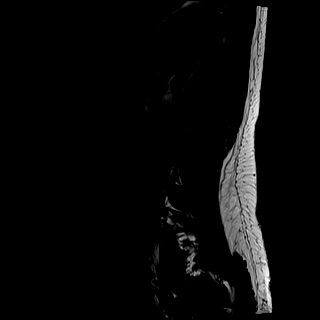
[im 6/17]
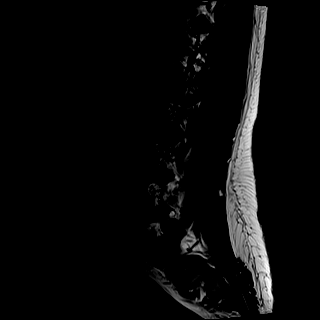
[im 9/17]
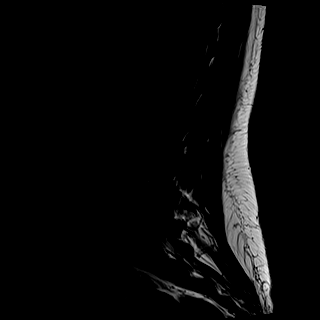
[im 11/17]
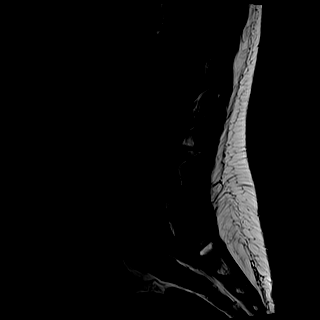
[im 14/17]
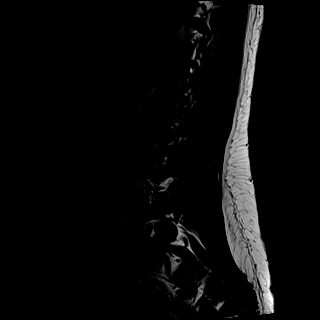
[im 17/17]
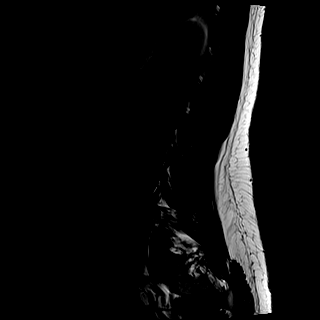

[Series 7: STIR · sagittal · 4.0mm · 0.41mm/px · 1 of 17 slices shown]
[im 1/17]
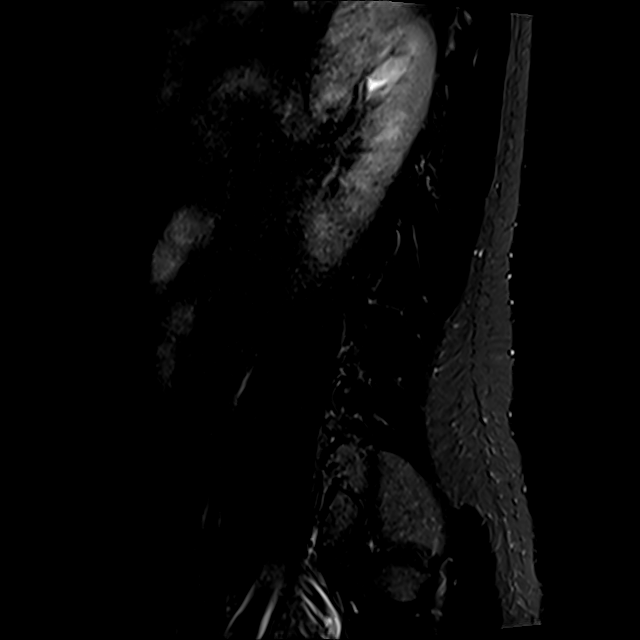

[Series 8: T2 · axial · 4.0mm · 0.78mm/px · z∈[-53,+161]mm · 9 of 32 slices shown (2 of 2)]
[im 1/32]
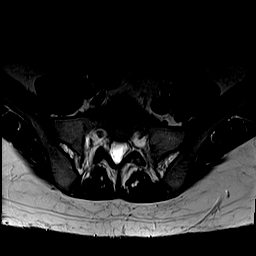
[im 6/32]
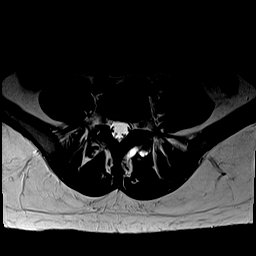
[im 11/32]
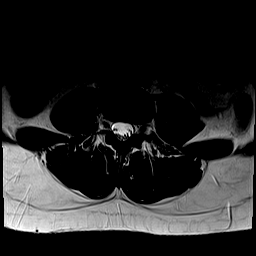
[im 13/32]
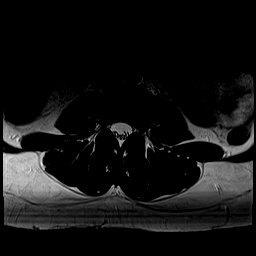
[im 16/32]
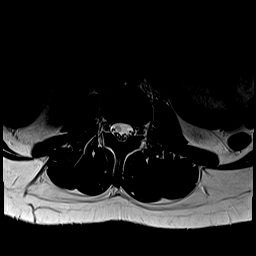
[im 19/32]
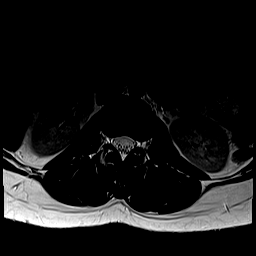
[im 21/32]
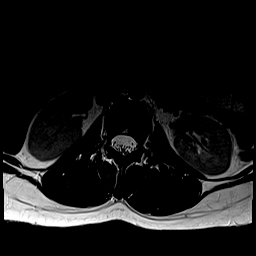
[im 26/32]
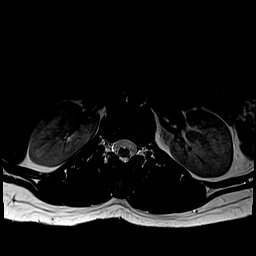
[im 32/32]
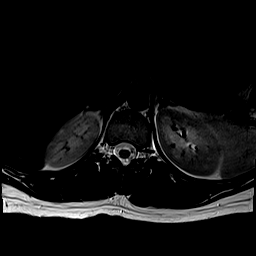

[Series 9: T1 · axial · 4.0mm · 0.39mm/px · z∈[-53,+161]mm · 9 of 32 slices shown (2 of 2)]
[im 1/32]
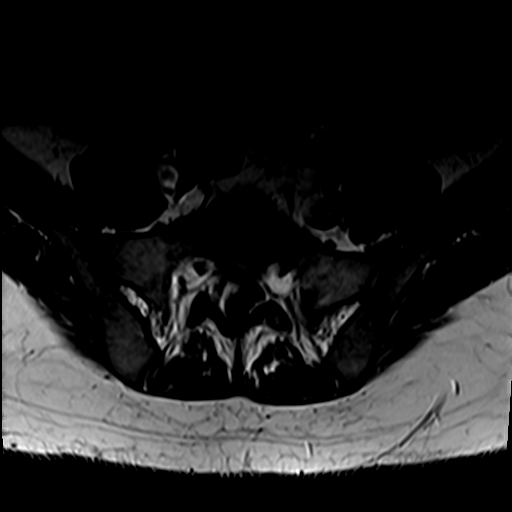
[im 6/32]
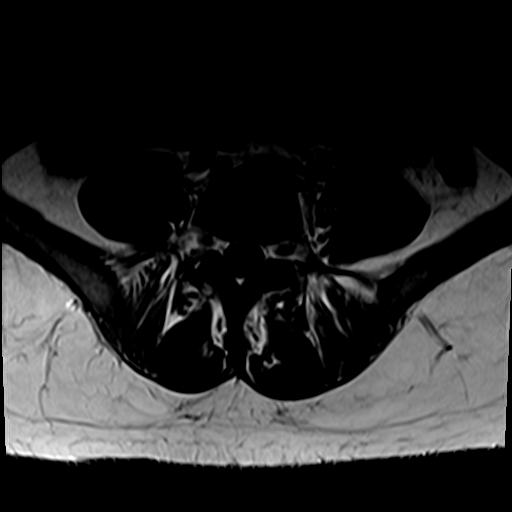
[im 11/32]
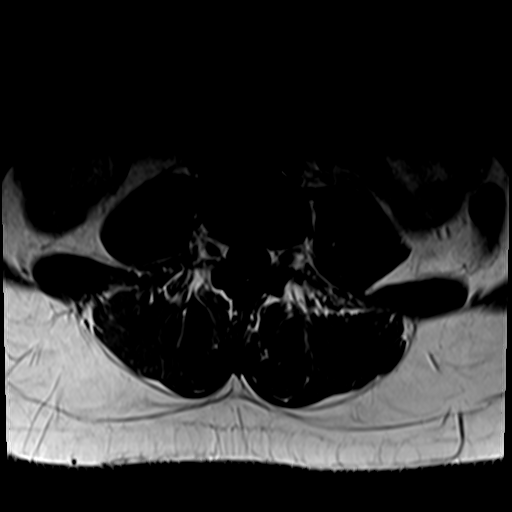
[im 13/32]
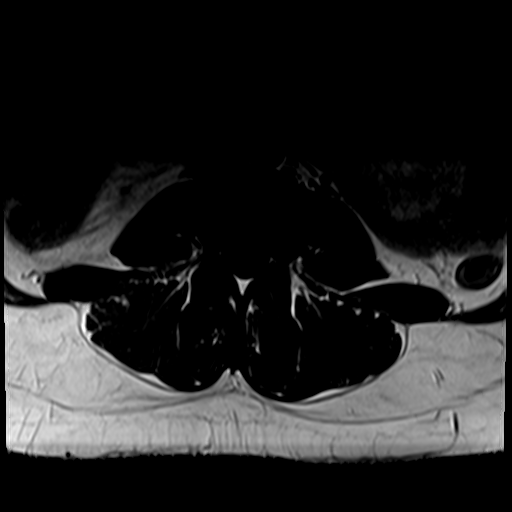
[im 16/32]
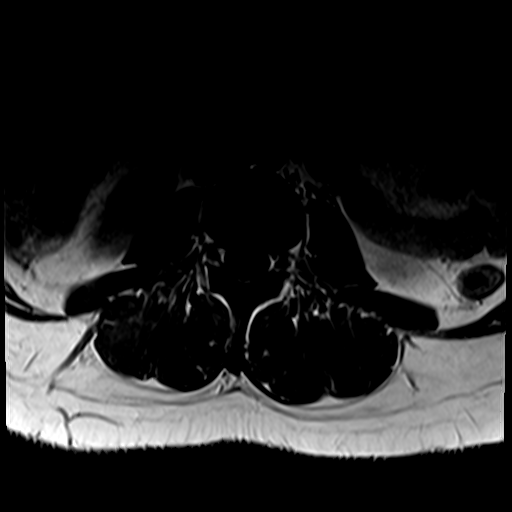
[im 19/32]
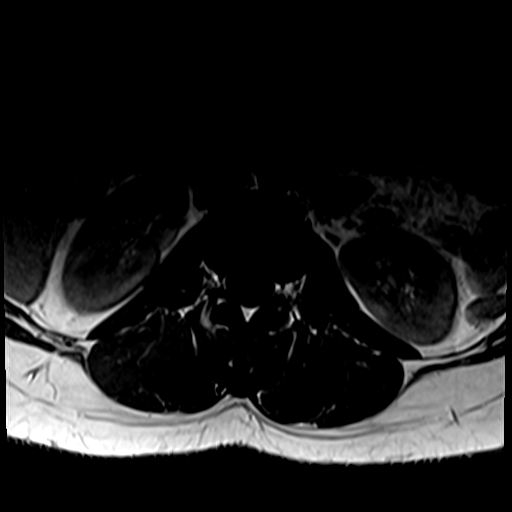
[im 21/32]
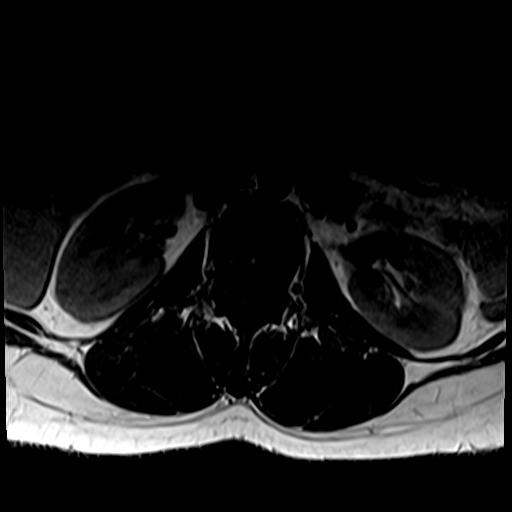
[im 26/32]
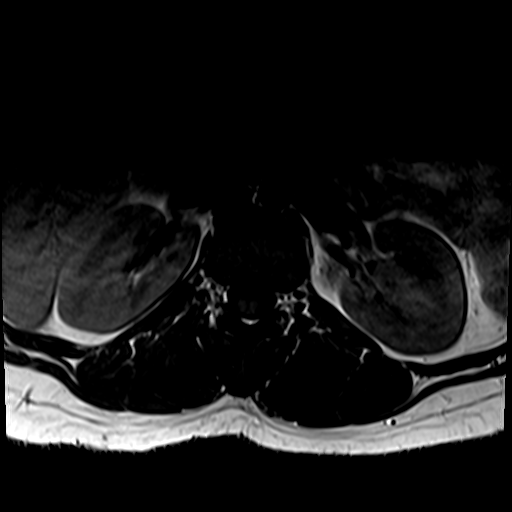
[im 32/32]
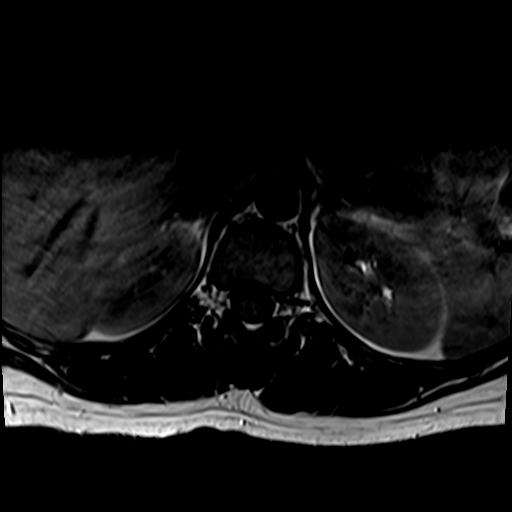

[34 of 48 positions shown; findings below may reference images not displayed]

FINDINGS: Segmentation:  Standard.

Alignment:  Physiologic.

Vertebrae:  No fracture, evidence of discitis, or bone lesion.

Conus medullaris and cauda equina: Conus extends to the L2 level.
Conus and cauda equina appear normal.

Paraspinal and other soft tissues: Negative.

Disc levels:

No spinal canal or neural foraminal stenosis.
IMPRESSION: Normal lumbar spine MRI.

## 2021-06-05 NOTE — ED Triage Notes (Signed)
Pt reports that she is having back pain that goes down her right leg causing numbness all the way down to her toes that has been going on for the last 5 days. Her toes are not numb. She is able to walk without difficulty. Denies any injury to her back

## 2021-06-05 NOTE — ED Provider Notes (Signed)
[facility] Emergency Department Provider Note  ____________________________________________  Time seen: Approximately 8:41 PM  I have reviewed the triage vital signs and the nursing notes.   HISTORY  Chief Complaint Numbness and Back Pain    HPI Jessica Mcintosh is a 30 y.o. female who presents the emergency department complaining of progressive numbness and tingling of the right lower extremity with right-sided lower back pain.  Patient has had no trauma.  She states that initially it felt like her foot was asleep, then progressed up her right lower extremity and is now having right lower back pain.  No history of sciatica.  She has no abdominal pain.  No urinary symptoms to include hematuria or dysuria.  No urinary frequency.  Patient has had no constipation, nausea vomiting or diarrhea.  Patient states that the symptoms feel best described as her leg is asleep.  She states that as she is walking her legs will feel heavy again like it is asleep.  There is no significant pain to the extremity.  She is had no recent illnesses.  Patient has no changes in the left lower extremity.  No neck or mid back pain.  Medical history as described below with no complaints of chronic medical issues.  Patient denies any bowel or bladder incontinence, or saddle anesthesia.  She states that the right thigh is numb but the left thigh is not.       Past Medical History:  Diagnosis Date   ADD (attention deficit disorder) 05/28/2020   Allergy    Ovarian cyst    Preeclampsia 2018    Patient Active Problem List   Diagnosis Date Noted   Tenderness of female pelvic organs 03/20/2020   Vitamin D deficiency 03/20/2020   Fatigue 03/20/2020   Weight gain 03/20/2020   Body mass index (BMI) of 31.0-31.9 in adult 03/20/2020   Screening for blood or protein in urine 03/20/2020   Dysmenorrhea 05/30/2019   History of gestational hypertension 10/07/2017    Past Surgical History:   Procedure Laterality Date   NO PAST SURGERIES      Prior to Admission medications   Medication Sig Start Date End Date Taking? Authorizing Provider  ADDERALL XR 10 MG 24 hr capsule Take 10 mg by mouth at bedtime. 05/28/20   [provider]  doxycycline (VIBRAMYCIN) 100 MG capsule Take 1 capsule (100 mg total) by mouth 2 (two) times daily. 08/08/20   Lawhorn, Vanessa [place], CNM  ELDERBERRY PO Take by mouth.    [provider]  fluconazole (DIFLUCAN) 150 MG tablet Take 1 tablet (150 mg total) by mouth daily. Repeat in 3 days if needed. Patient not taking: No sig reported 06/20/20   Gunnar Bulla, CNM  fluconazole (DIFLUCAN) 200 MG tablet Take 1 tablet (200 mg total) by mouth daily. Take two tablets on first day, then one tablet daily to complete 14 day regimen 07/26/20   Lawhorn, Vanessa [place], CNM  ibuprofen (ADVIL) 800 MG tablet Take 1 tablet (800 mg total) by mouth every 8 (eight) hours as needed. Patient not taking: Reported on 07/26/2020 05/30/19   Gunnar Bulla, CNM  ibuprofen (ADVIL) 800 MG tablet Take 1 tablet (800 mg total) by mouth every 8 (eight) hours as needed. 07/26/20   Lawhorn, Vanessa [place], CNM  metroNIDAZOLE (FLAGYL) 500 MG tablet Take 1 tablet (500 mg total) by mouth 2 (two) times daily. Patient not taking: No sig reported 06/20/20   Gunnar Bulla, CNM  Multiple Vitamins-Minerals (MULTIVITAMIN  ADULTS PO) Take by mouth.    [provider]    Allergies Aspirin and Penicillins  Family History  Problem Relation Age of Onset   Depression Mother    Hypertension Mother    COPD Mother    Depression Maternal Aunt    Hypertension Maternal Aunt    Cancer Paternal Uncle    Lung cancer Paternal Uncle    Breast cancer Maternal Grandmother    Depression Maternal Grandmother    Cancer Maternal Grandfather    Lung cancer Maternal Grandfather    Cancer Paternal Grandfather    Skin cancer Paternal Grandfather     Alcoholism Father    Hypertension Father    Ovarian cancer Neg Hx    Colon cancer Neg Hx     Social History Social History   Tobacco Use   Smoking status: Never   Smokeless tobacco: Never  Vaping Use   Vaping Use: Never used  Substance Use Topics   Alcohol use: No   Drug use: No     Review of Systems  Constitutional: No fever/chills Eyes: No visual changes. No discharge ENT: No upper respiratory complaints. Cardiovascular: no chest pain. Respiratory: no cough. No SOB. Gastrointestinal: No abdominal pain.  No nausea, no vomiting.  No diarrhea.  No constipation. Genitourinary: Negative for dysuria. No hematuria Musculoskeletal: Positive for a sending numbness and tingling of the right lower extremity with right-sided lower back pain Skin: Negative for rash, abrasions, lacerations, ecchymosis. Neurological: Negative for headaches, focal weakness or numbness.  10 System ROS otherwise negative.  ____________________________________________   PHYSICAL EXAM:  VITAL SIGNS: ED Triage Vitals [06/05/21 1820]  Enc Vitals Group     BP (!) 146/86     Pulse Rate (!) 102     Resp 18     Temp 99 F (37.2 C)     Temp Source Oral     SpO2 100 %     Weight 132 lb (59.9 kg)     Height 5\' 1"  (1.549 m)     Head Circumference      Peak Flow      Pain Score 5     Pain Loc      Pain Edu?      Excl. in GC?      Constitutional: Alert and oriented. Well appearing and in no acute distress. Eyes: Conjunctivae are normal. PERRL. EOMI. Head: Atraumatic. ENT:      Ears:       Nose: No congestion/rhinnorhea.      Mouth/Throat: Mucous membranes are moist.  Neck: No stridor.  No cervical spine tenderness to palpation  Cardiovascular: Normal rate, regular rhythm. Normal S1 and S2.  Good peripheral circulation. Respiratory: Normal respiratory effort without tachypnea or retractions. Lungs CTAB. Good air entry to the bases with no decreased or absent breath sounds. Gastrointestinal:  Bowel sounds 4 quadrants. Soft and nontender to palpation. No guarding or rigidity. No palpable masses. No distention. No CVA tenderness Musculoskeletal: Full range of motion to all extremities. No gross deformities appreciated. Neurologic:  Normal speech and language. No gross focal neurologic deficits are appreciated.  Skin:  Skin is warm, dry and intact. No rash noted. Psychiatric: Mood and affect are normal. Speech and behavior are normal. Patient exhibits appropriate insight and judgement.   ____________________________________________   LABS (all labs ordered are listed, but only abnormal results are displayed)  Labs Reviewed  URINALYSIS, COMPLETE (UACMP) WITH MICROSCOPIC - Abnormal; Notable for the following components:  Result Value   Color, Urine YELLOW (*)    APPearance HAZY (*)    Leukocytes,Ua MODERATE (*)    Bacteria, UA RARE (*)    All other components within normal limits   ____________________________________________  EKG   ____________________________________________  RADIOLOGY I personally viewed and evaluated these images as part of my medical decision making, as well as reviewing the written report by the radiologist.  ED Provider Interpretation: X-ray revealed no acute findings in the lumbar spine, questionable stone identified in the left kidney.  MRI pending  No results found.  ____________________________________________    PROCEDURES  Procedure(s) performed:    Procedures    Medications - No data to display   ____________________________________________   INITIAL IMPRESSION / ASSESSMENT AND PLAN / ED COURSE  Pertinent labs & imaging results that were available during my care of the patient were reviewed by me and considered in my medical decision making (see chart for details).  Review of the [place] CSRS was performed in accordance of the [facility] prior to dispensing any controlled drugs.           Patient presented to the  emergency department complaining of a sending numbness and tingling to the right leg.  This began 5 days ago and first involve the foot is involved the entire right lower extremity extending to the back.  Numbness and tingling of the leg but pain along the right paraspinal muscle group.  No midline tenderness.  No SI joint or sciatic notch tenderness.  Patient has a negative straight leg raise.  DTRs were intact, pulses intact but patient had decrease in sensation in all dermatomal distributions.  Patient has good sensation intact to the unaffected left side.  Patient did have some numbness along the inner thigh on the right but this is not replicated on the left side.  No urinary or GI complaints.  At this time x-ray was reassuring, MRI had been ordered.  Pending performance of the MRI, patient will be moved to major side of the emergency department as the section is closing.  I have given the patient's history, physical exam and work-up to this time to Dr. Elesa Massed.  Patient care will be transferred to Dr. Elesa Massed for final diagnosis and disposition.  Differential includes sciatica, lumbar radiculopathy, Katheran Awe, myasthenia gravis, cauda equina.      This chart was dictated using voice recognition software/Dragon. Despite best efforts to proofread, errors can occur which can change the meaning. Any change was purely unintentional.    Racheal Patches, PA-C 06/05/21 [id number]    Ward, Layla Maw, DO 06/06/21 [phone]

## 2021-06-05 NOTE — ED Provider Notes (Signed)
  Physical Exam  BP 134/87 (BP Location: Left Arm)   Pulse (!) 103   Temp 98.8 F (37.1 C) (Oral)   Resp 20   Ht 5\' 1"  (1.549 m)   Wt 59.9 kg   SpO2 100%   BMI 24.94 kg/m   Physical Exam  ED Course/Procedures     Procedures  MDM  11:30 PM  Assumed care at shift change.  Patient is a 30 year old female who presents to the emergency department with progressive numbness and tingling of the right lower extremity with right-sided back pain.  Able to ambulate.  Urine and urine pregnancy unremarkable.  MRI of the lumbar spine pending.   12:25 AM  Pt's MRI lumbar spine shows no acute abnormality.  On my reevaluation patient states that she has had ascending numbness and weakness up her right leg now having numbness in her right back.  Denies any back pain.  She does have diminished reflexes in the right leg compared to the left but is not hyperreflexive.  She does have diminished strength in the right lower extremity in all muscle groups compared to the left side.  She has no midline spinal tenderness, step-off or deformity.  No recent fever or other infectious symptoms.  No family history of MS or other demyelinating disorders.  Given MRI of the lumbar spine is unremarkable and she is still having neurologic deficits of the right leg, have recommended MRI of the thoracic spine for further evaluation.  She has no involvement of her face or upper extremities.  We also discussed the possibility of an atypical Guillain-Barr.  She has not had any recent infectious symptoms.  We will have neurology evaluate patient as well for further recommendations.   2:20 AM  Discussed case with telemetry neurologist who has seen the patient as well as radiology who is reviewing her MRIs.  There is concern for potential demyelinating lesion of the thoracic spine.  Neurology and radiology feel patient will need MRI brain with and without contrast, MRI cervical spine with and without contrast and MRI of the thoracic  spine with contrast.  Radiologist also recommends giving 1 g of Solu-Medrol now and for the next 5 days.  They recommend lumbar puncture that can be done in the hospital under fluoroscopy and does not need to be done emergently.  Will discuss with medicine for admission per neurology recommendations.  2:45 AM Discussed patient's case with hospitalist, Dr. 37.  I have recommended admission and patient (and family if present) agree with this plan. Admitting physician will place admission orders.   I reviewed all nursing notes, vitals, pertinent previous records and reviewed/interpreted all EKGs, lab and urine results, imaging (as available).    CRITICAL CARE Performed by: Para March   Total critical care time: 45 minutes  Critical care time was exclusive of separately billable procedures and treating other patients.  Critical care was necessary to treat or prevent imminent or life-threatening deterioration.  Critical care was time spent personally by me on the following activities: development of treatment plan with patient and/or surrogate as well as nursing, discussions with consultants, evaluation of patient's response to treatment, examination of patient, obtaining history from patient or surrogate, ordering and performing treatments and interventions, ordering and review of laboratory studies, ordering and review of radiographic studies, pulse oximetry and re-evaluation of patient's condition.    Tynetta Bachmann, Rochele Raring, DO 06/06/21 [phone]

## 2021-06-06 ENCOUNTER — Emergency Department: Payer: Medicaid Other

## 2021-06-06 DIAGNOSIS — G35 Multiple sclerosis: Principal | ICD-10-CM

## 2021-06-06 DIAGNOSIS — R29818 Other symptoms and signs involving the nervous system: Secondary | ICD-10-CM | POA: Diagnosis not present

## 2021-06-06 DIAGNOSIS — R29898 Other symptoms and signs involving the musculoskeletal system: Secondary | ICD-10-CM | POA: Diagnosis present

## 2021-06-06 DIAGNOSIS — G379 Demyelinating disease of central nervous system, unspecified: Secondary | ICD-10-CM | POA: Diagnosis not present

## 2021-06-06 HISTORY — DX: Other symptoms and signs involving the nervous system: R29.818

## 2021-06-06 LAB — CBC WITH DIFFERENTIAL/PLATELET
Abs Immature Granulocytes: 0.06 10*3/uL (ref 0.00–0.07)
Basophils Absolute: 0.1 10*3/uL (ref 0.0–0.1)
Basophils Relative: 1 %
Eosinophils Absolute: 0.1 10*3/uL (ref 0.0–0.5)
Eosinophils Relative: 1 %
HCT: 44.2 % (ref 36.0–46.0)
Hemoglobin: 14.4 g/dL (ref 12.0–15.0)
Immature Granulocytes: 1 %
Lymphocytes Relative: 24 %
Lymphs Abs: 3.1 10*3/uL (ref 0.7–4.0)
MCH: 29.9 pg (ref 26.0–34.0)
MCHC: 32.6 g/dL (ref 30.0–36.0)
MCV: 91.7 fL (ref 80.0–100.0)
Monocytes Absolute: 0.8 10*3/uL (ref 0.1–1.0)
Monocytes Relative: 6 %
Neutro Abs: 8.8 10*3/uL — ABNORMAL HIGH (ref 1.7–7.7)
Neutrophils Relative %: 67 %
Platelets: 304 10*3/uL (ref 150–400)
RBC: 4.82 MIL/uL (ref 3.87–5.11)
RDW: 12.4 % (ref 11.5–15.5)
WBC: 12.8 10*3/uL — ABNORMAL HIGH (ref 4.0–10.5)
nRBC: 0 % (ref 0.0–0.2)

## 2021-06-06 LAB — HIV ANTIBODY (ROUTINE TESTING W REFLEX): HIV Screen 4th Generation wRfx: NONREACTIVE

## 2021-06-06 LAB — BASIC METABOLIC PANEL
Anion gap: 8 (ref 5–15)
BUN: 8 mg/dL (ref 6–20)
CO2: 24 mmol/L (ref 22–32)
Calcium: 9 mg/dL (ref 8.9–10.3)
Chloride: 105 mmol/L (ref 98–111)
Creatinine, Ser: 0.59 mg/dL (ref 0.44–1.00)
GFR, Estimated: >60 mL/min (ref >60)
Glucose, Bld: 91 mg/dL (ref 70–99)
Potassium: 3.4 mmol/L — ABNORMAL LOW (ref 3.5–5.1)
Sodium: 137 mmol/L (ref 135–145)

## 2021-06-06 LAB — RESP PANEL BY RT-PCR (FLU A&B, COVID) ARPGX2
Influenza A by PCR: NEGATIVE
Influenza B by PCR: NEGATIVE
SARS Coronavirus 2 by RT PCR: NEGATIVE

## 2021-06-06 IMAGING — MR MR HEAD WO/W CM
16 series · 48 of 48 positions shown · IV contrast (gadavist)
Comparison: None.

CLINICAL DATA: Multiple sclerosis, new event

EXAM:
MRI HEAD WITHOUT AND WITH CONTRAST
TECHNIQUE: Multiplanar, multiecho pulse sequences of the brain and surrounding
structures were obtained without and with intravenous contrast.
CONTRAST:  6mL GADAVIST GADOBUTROL 1 MMOL/ML IV SOLN

[Series 5: FLAIR · sagittal · 5.0mm · 0.94mm/px · 2 of 25 slices shown (1 of 2)]
[im 1/25]
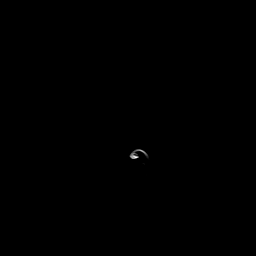
[im 25/25]
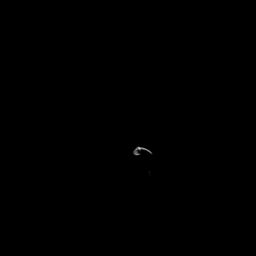

[Series 6: ax dwi_tracew · axial · 3.0mm · 0.65mm/px · z∈[-141,+16]mm · 5 of 100 slices shown]
[im 1/100]
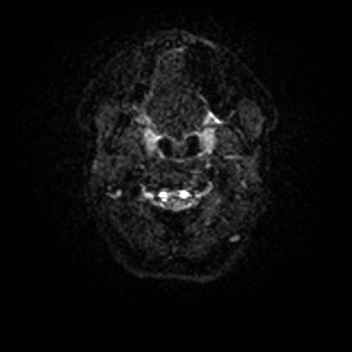
[im 25/100]
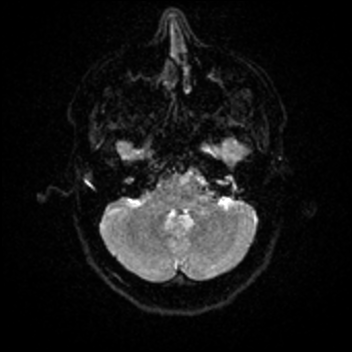
[im 50/100]
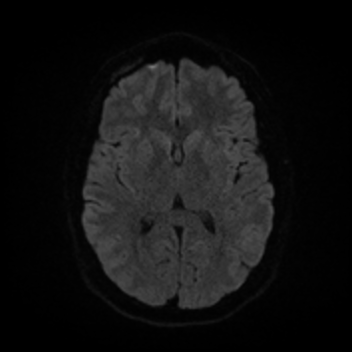
[im 75/100]
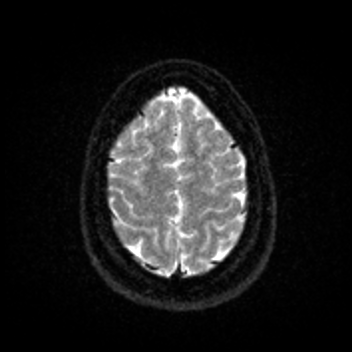
[im 100/100]
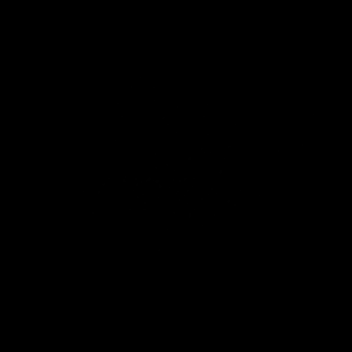

[Series 7: ax dwi_adc · axial · 3.0mm · 0.65mm/px · z∈[-141,+13]mm · 2 of 49 slices shown]
[im 1/49]
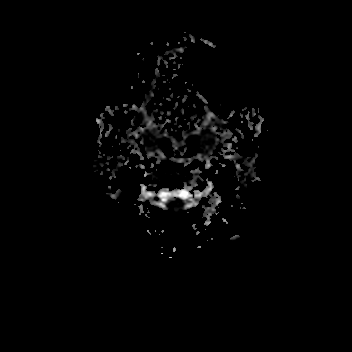
[im 49/49]
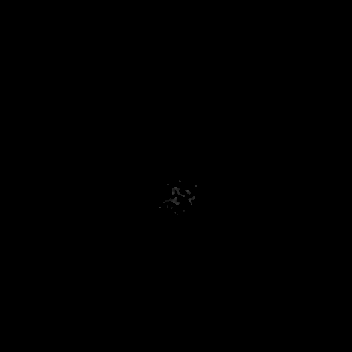

[Series 8: cor dwi_tracew · coronal · 5.0mm · 0.65mm/px · 4 of 80 slices shown]
[im 1/80]
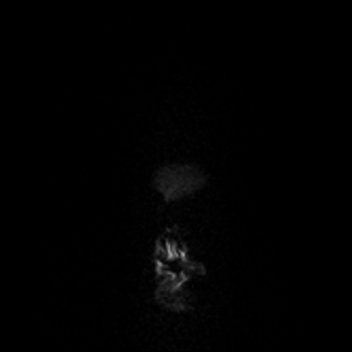
[im 27/80]
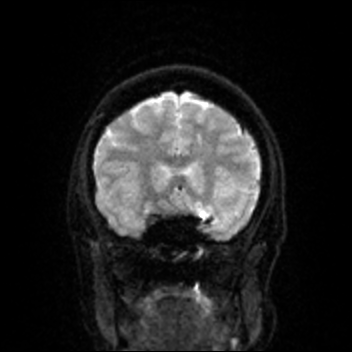
[im 53/80]
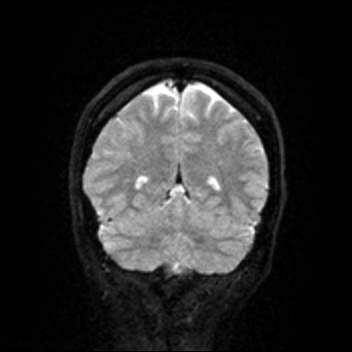
[im 80/80]
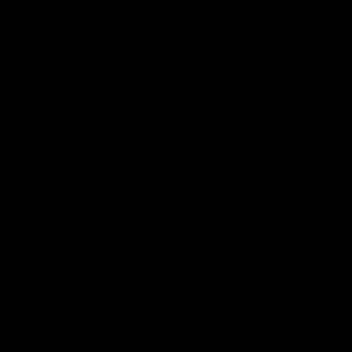

[Series 9: cor dwi_adc · coronal · 5.0mm · 0.65mm/px · 2 of 39 slices shown]
[im 1/39]
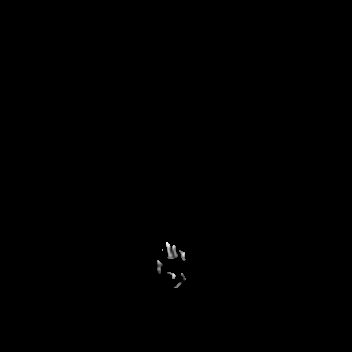
[im 39/39]
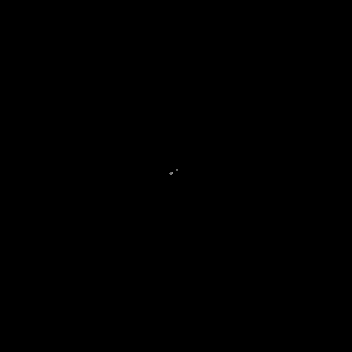

[Series 10: T1 · sagittal · 5.0mm · 0.62mm/px · 1 of 23 slices shown (1 of 2)]
[im 1/23]
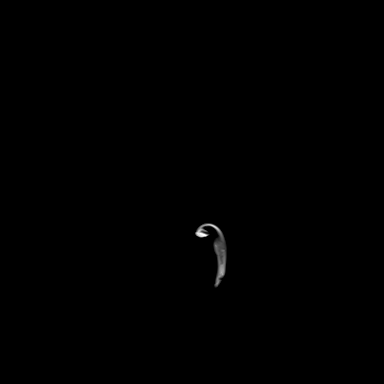

[Series 11: T2 · axial · 5.0mm · 0.53mm/px · 1 of 27 slices shown (1 of 2)]
[im 1/27]
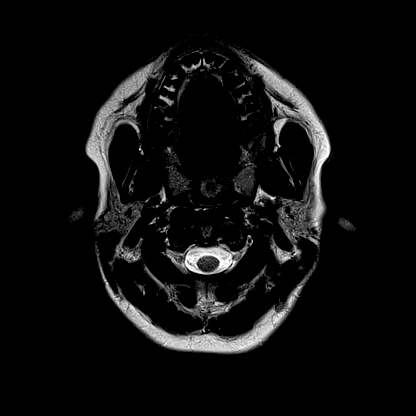

[Series 12: mag_images · axial · 3.0mm · 0.90mm/px · z∈[-141,+33]mm · 3 of 60 slices shown]
[im 1/60]
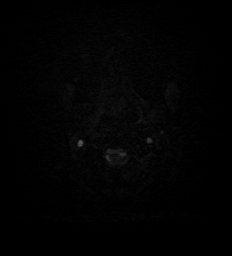
[im 30/60]
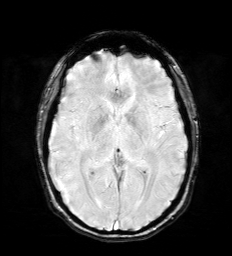
[im 60/60]
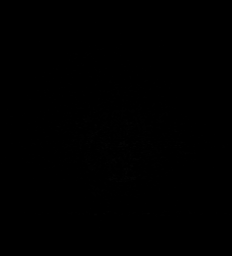

[Series 13: pha_images · axial · 3.0mm · 0.90mm/px · z∈[-141,+33]mm · 3 of 60 slices shown]
[im 1/60]
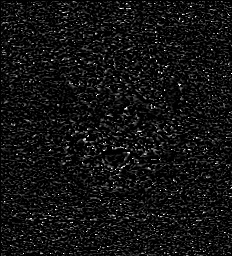
[im 30/60]
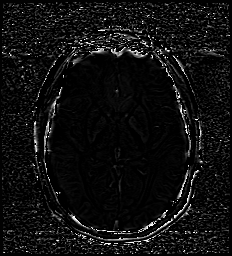
[im 60/60]
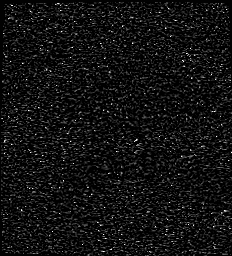

[Series 14: swi_images · axial · 3.0mm · 0.90mm/px · z∈[-141,+33]mm · 3 of 60 slices shown]
[im 1/60]
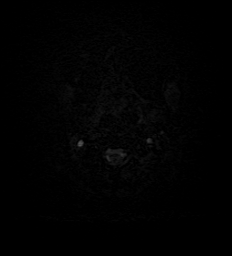
[im 30/60]
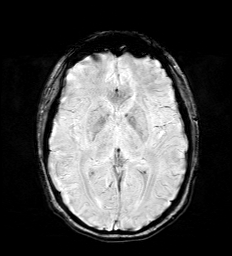
[im 60/60]
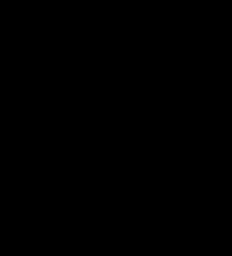

[Series 16: FLAIR · axial · 3.0mm · 0.53mm/px · z∈[-134,+26]mm · 3 of 55 slices shown (2 of 2)]
[im 1/55]
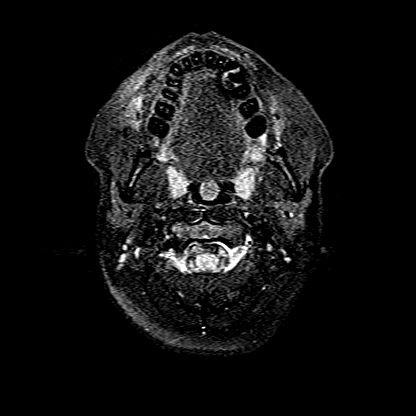
[im 28/55]
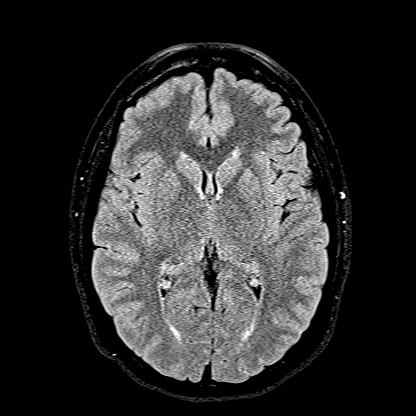
[im 55/55]
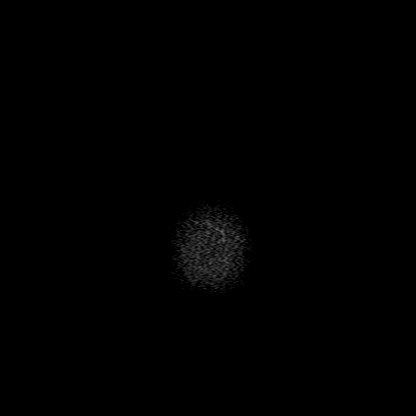

[Series 17: T1 · axial · 1.0mm · 0.98mm/px · z∈[-154,+16]mm · 8 of 176 slices shown (2 of 2)]
[im 1/176]
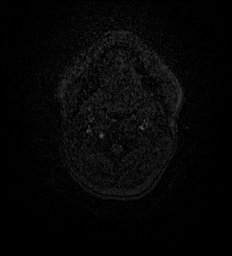
[im 26/176]
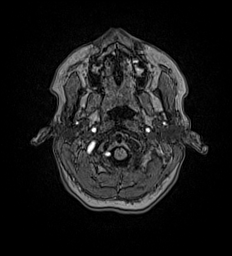
[im 51/176]
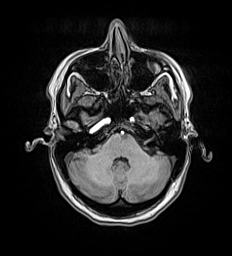
[im 76/176]
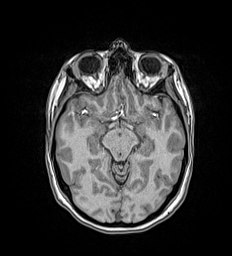
[im 101/176]
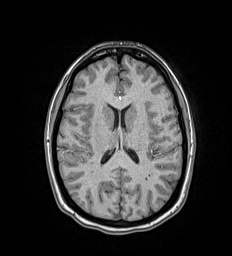
[im 126/176]
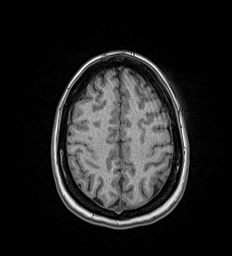
[im 151/176]
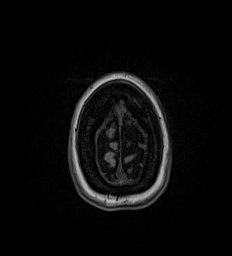
[im 176/176]
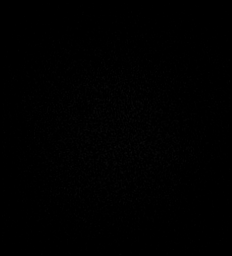

[Series 18: T2 · coronal · 5.0mm · 0.57mm/px · 1 of 30 slices shown (2 of 2)]
[im 1/30]
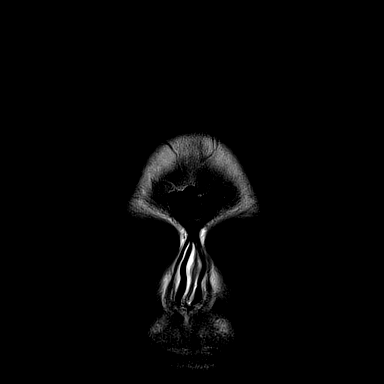

[Series 23: T1 post-contrast · axial · 1.0mm · 0.98mm/px · z∈[-148,+23]mm · 8 of 176 slices shown (1 of 3)]
[im 1/176]
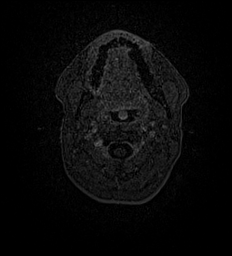
[im 26/176]
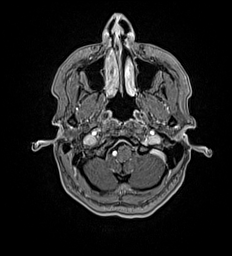
[im 51/176]
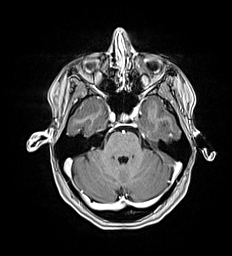
[im 76/176]
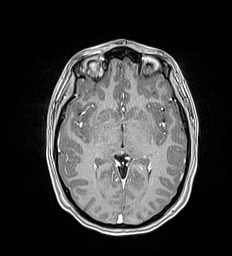
[im 101/176]
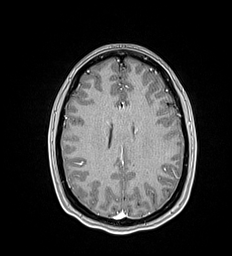
[im 126/176]
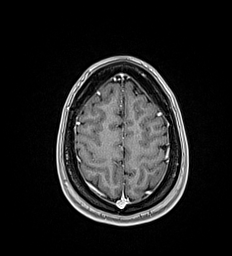
[im 151/176]
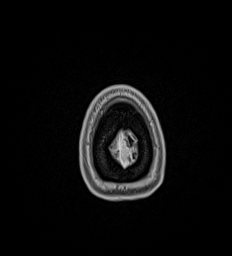
[im 176/176]
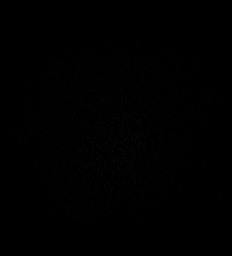

[Series 24: T1 post-contrast · coronal · 5.0mm · 0.57mm/px · 1 of 31 slices shown (2 of 3)]
[im 1/31]
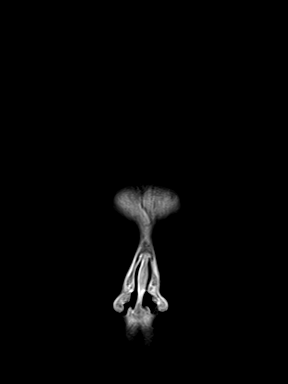

[Series 25: T1 post-contrast · sagittal · 5.0mm · 0.62mm/px · 1 of 23 slices shown (3 of 3)]
[im 1/23]
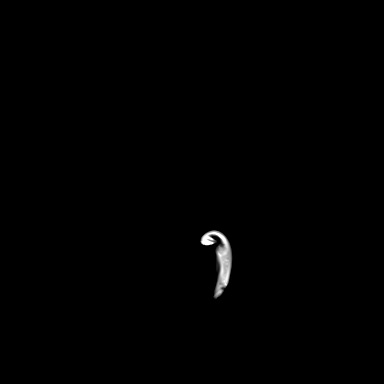

[48 of 48 positions shown; findings below may reference images not displayed]

FINDINGS: Brain: At least 5 FLAIR hyperintensities in the bilateral cerebral
white matter which are primarily juxtacortical and ovoid in shape.
FLAIR hyperintensity also seen in the ventral left pons. No
enhancing disease or restricted diffusion. No black holes or brain
atrophy. No infarct, hydrocephalus, or collection

Vascular: Normal flow voids.  Normal vessel enhancement

Skull and upper cervical spine: Normal marrow signal

Sinuses/Orbits: Negative
IMPRESSION: Few signal abnormalities in the cerebral white matter and pons which
are supportive of demyelinating disease. No enhancing lesions.

## 2021-06-06 IMAGING — MR MR CERVICAL SPINE WO/W CM
8 of 14 series · 21 of 48 positions shown · IV contrast (gadavist)
Comparison: None.

CLINICAL DATA: Demyelinating disease suspected on cervical MRI

EXAM:
MRI CERVICAL SPINE WITHOUT AND WITH CONTRAST
TECHNIQUE: Multiplanar and multiecho pulse sequences of the cervical spine, to
include the craniocervical junction and cervicothoracic junction,
were obtained without and with intravenous contrast.
CONTRAST:  6mL GADAVIST GADOBUTROL 1 MMOL/ML IV SOLN

[Series 9: T2 · sagittal · 3.0mm · 0.62mm/px · 2 of 15 slices shown (1 of 2)]
[im 1/15]
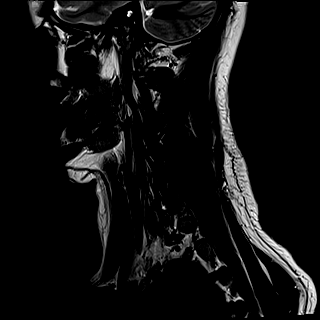
[im 15/15]
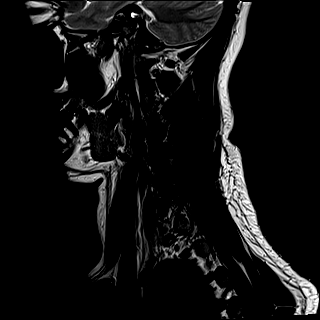

[Series 10: FLAIR · sagittal · 3.0mm · 0.78mm/px · 2 of 15 slices shown]
[im 1/15]
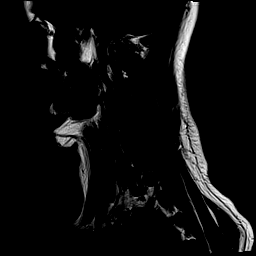
[im 15/15]
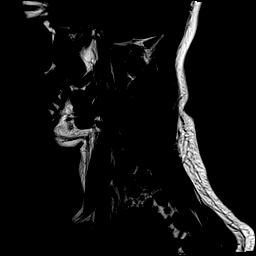

[Series 11: STIR · sagittal · 3.0mm · 0.62mm/px · 2 of 15 slices shown]
[im 1/15]
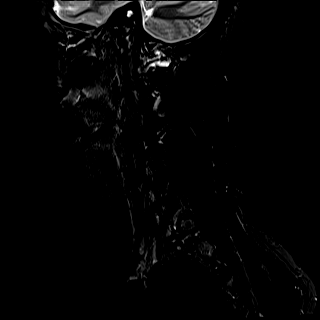
[im 15/15]
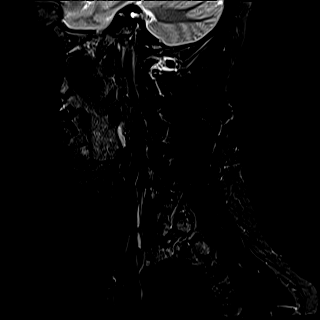

[Series 12: T2 · axial · 3.0mm · 0.70mm/px · z∈[-241,-135]mm · 3 of 32 slices shown (2 of 2)]
[im 1/32]
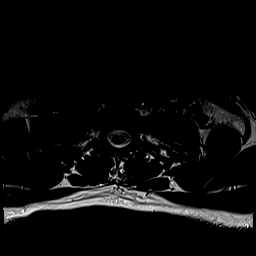
[im 16/32]
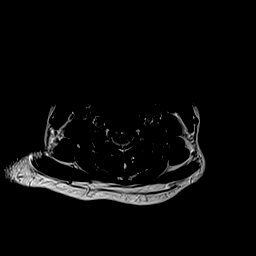
[im 32/32]
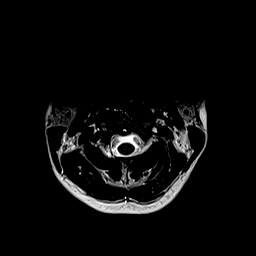

[Series 13: ax mpgr · axial · 3.0mm · 0.35mm/px · z∈[-241,-135]mm · 3 of 32 slices shown]
[im 1/32]
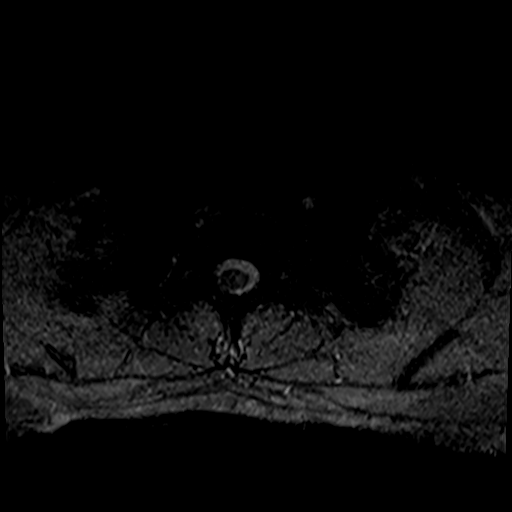
[im 16/32]
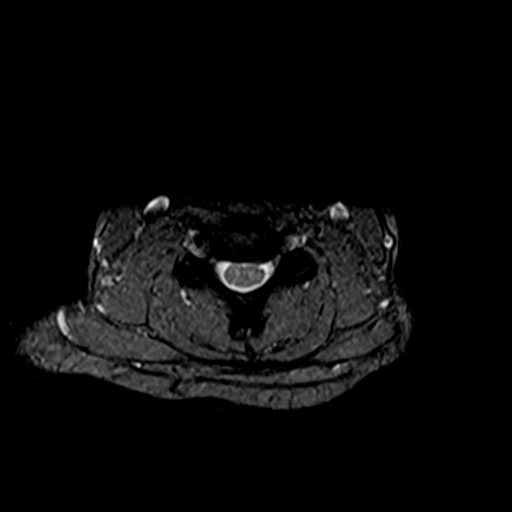
[im 32/32]
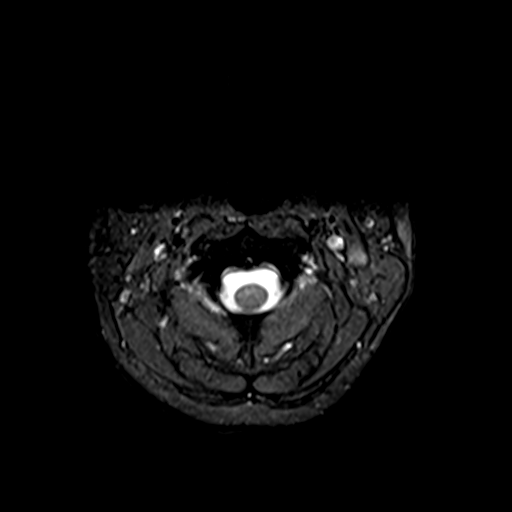

[Series 14: T1 · axial · non-contrast · 3.0mm · 0.35mm/px · z∈[-241,-135]mm · 3 of 32 slices shown]
[im 1/32]
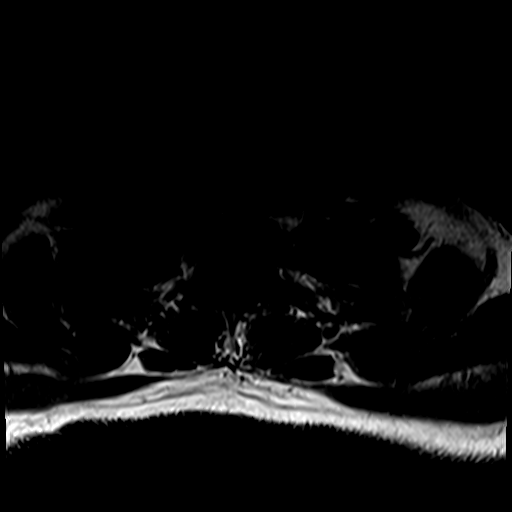
[im 16/32]
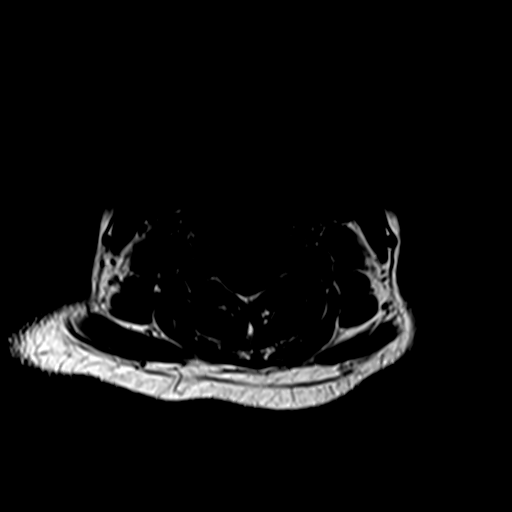
[im 32/32]
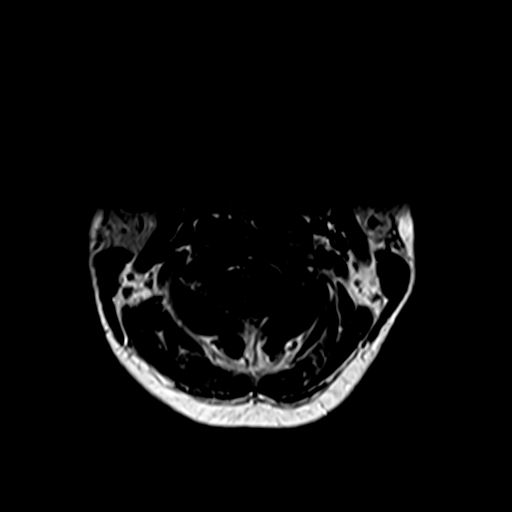

[Series 24: FLAIR fat-sat post-contrast · sagittal · 3.0mm · 0.78mm/px · 2 of 15 slices shown]
[im 1/15]
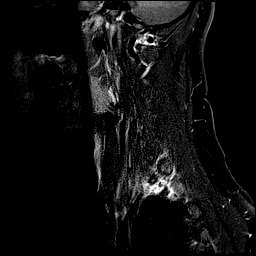
[im 15/15]
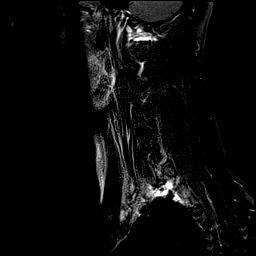

[Series 25: T1 post-contrast · axial · 3.0mm · 0.35mm/px · z∈[-249,-128]mm · 4 of 36 slices shown]
[im 1/36]
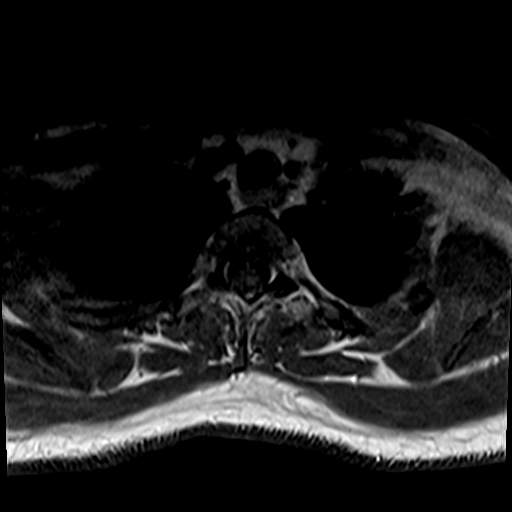
[im 12/36]
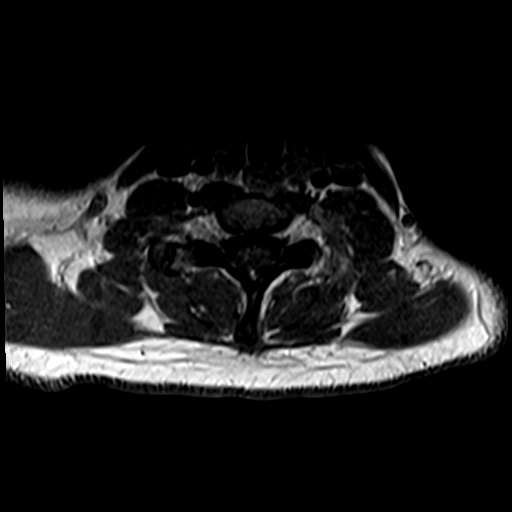
[im 24/36]
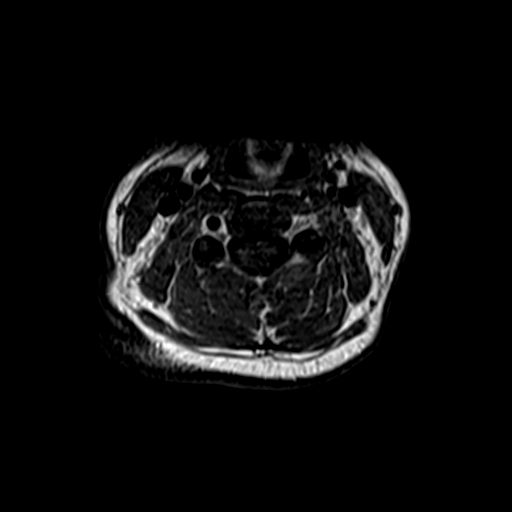
[im 36/36]
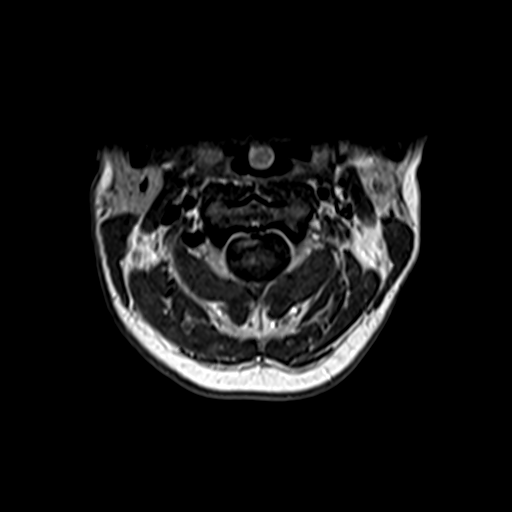

[21 of 48 positions shown; findings below may reference images not displayed]

FINDINGS: Alignment: Straightening of cervical lordosis

Vertebrae: No fracture, evidence of discitis, or bone lesion.

Cord: Normal signal and morphology.

Posterior Fossa, vertebral arteries, paraspinal tissues: Pontine
signal abnormality is described on brain MRI.

Disc levels:

C5-6 broad central disc protrusion extending to the left foramen
where there is also uncovertebral spurring and moderate foraminal
stenosis

C6-7 right paracentral protrusion which could affect the intradural
C7 nerve root on the right.
IMPRESSION: 1. Normal MRI of the cervical cord.
2. Disc protrusions at C5-6 and C6-7.

## 2021-06-06 IMAGING — MR MR THORACIC SPINE W/O CM
7 series · 30 of 48 positions shown · non-contrast
Comparison: None.

CLINICAL DATA: Left lower extremity weakness

EXAM:
MRI THORACIC SPINE WITHOUT CONTRAST
TECHNIQUE: Multiplanar, multisequence MR imaging of the thoracic spine was
performed. No intravenous contrast was administered.

[Series 16: T1 · sagittal · 5.0mm · 1.88mm/px · 2 of 9 slices shown (1 of 3)]
[im 1/9]
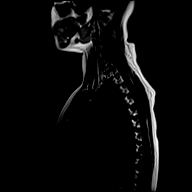
[im 9/9]
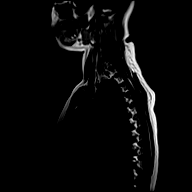

[Series 18: T1 · sagittal · 6.0mm · 1.88mm/px · 1 of 5 slices shown (2 of 3)]
[im 1/5]
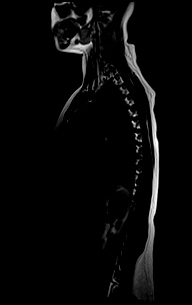

[Series 19: T2 · sagittal · 3.0mm · 1.09mm/px · 5 of 19 slices shown (1 of 2)]
[im 1/19]
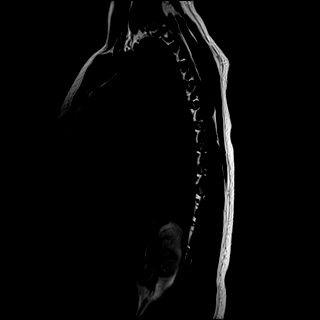
[im 5/19]
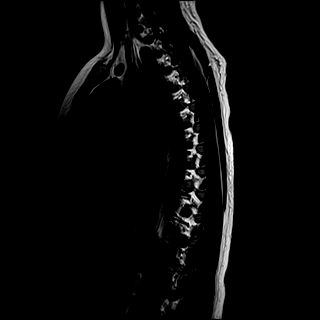
[im 10/19]
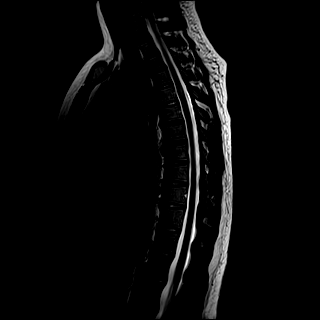
[im 14/19]
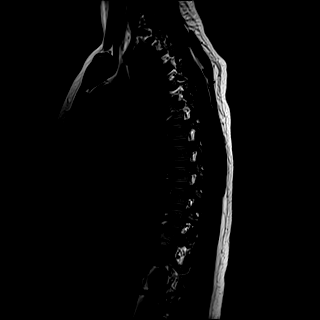
[im 19/19]
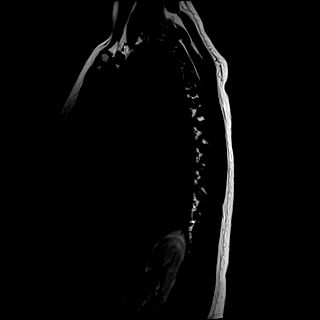

[Series 20: T1 · sagittal · 3.0mm · 1.06mm/px · 6 of 19 slices shown (3 of 3)]
[im 1/19]
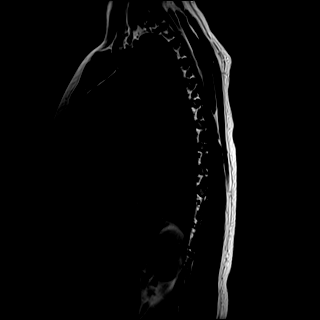
[im 4/19]
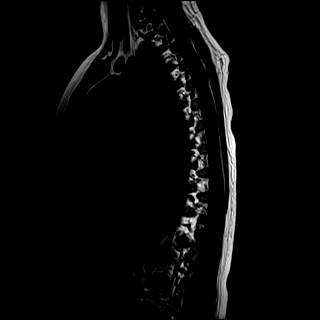
[im 8/19]
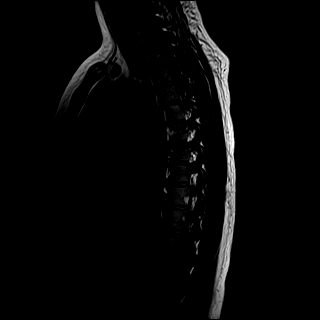
[im 11/19]
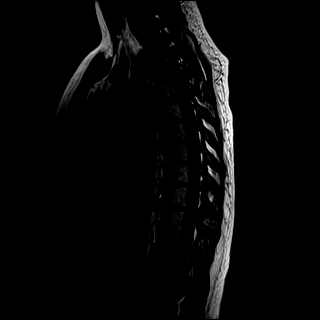
[im 15/19]
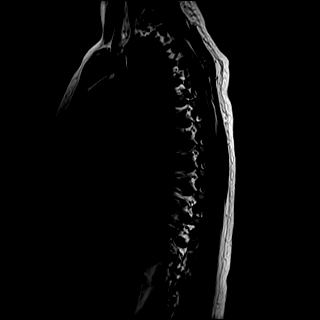
[im 19/19]
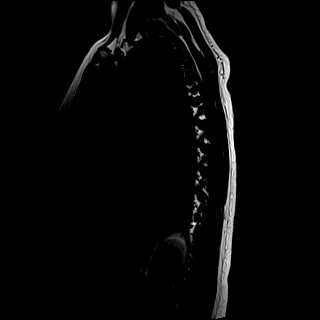

[Series 21: STIR · sagittal · 3.0mm · 0.53mm/px · 6 of 19 slices shown]
[im 1/19]
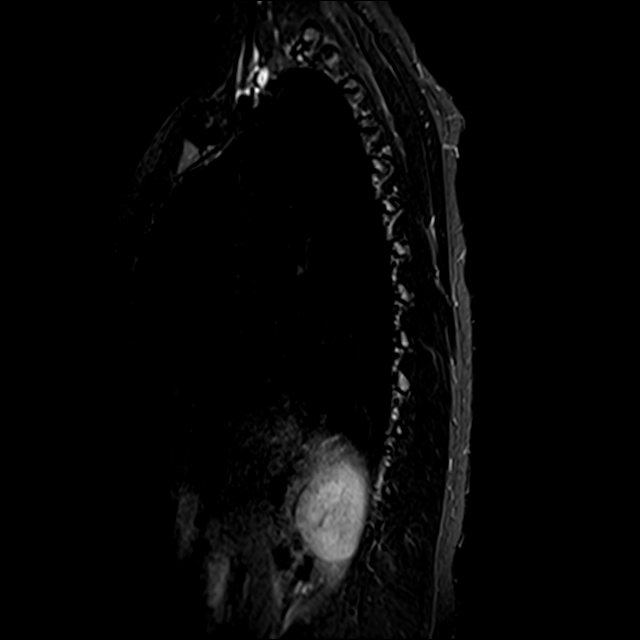
[im 4/19]
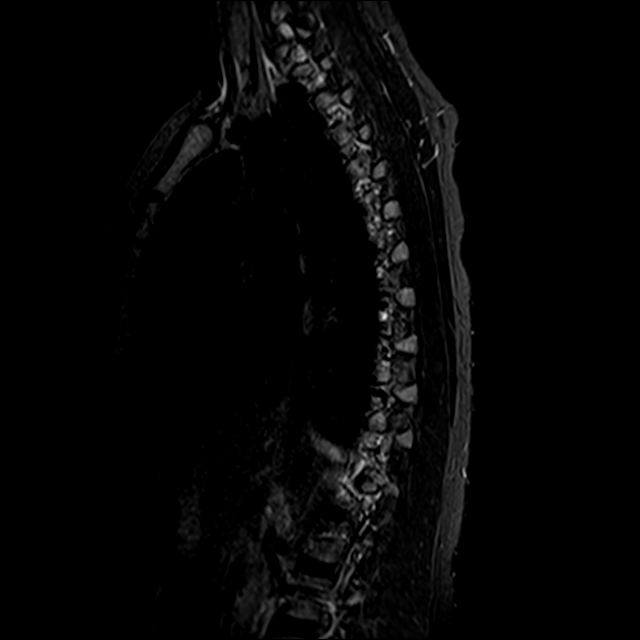
[im 8/19]
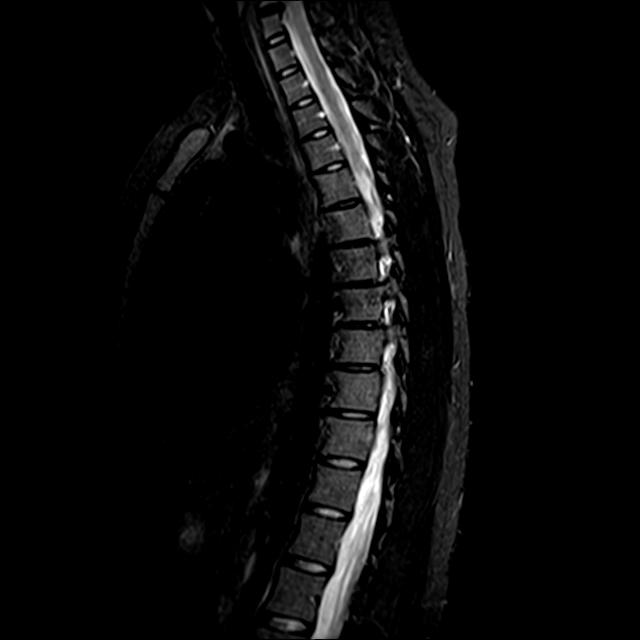
[im 11/19]
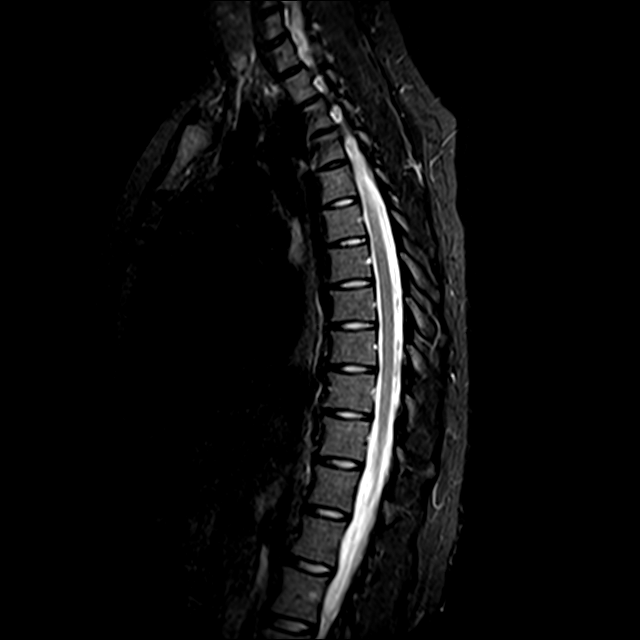
[im 15/19]
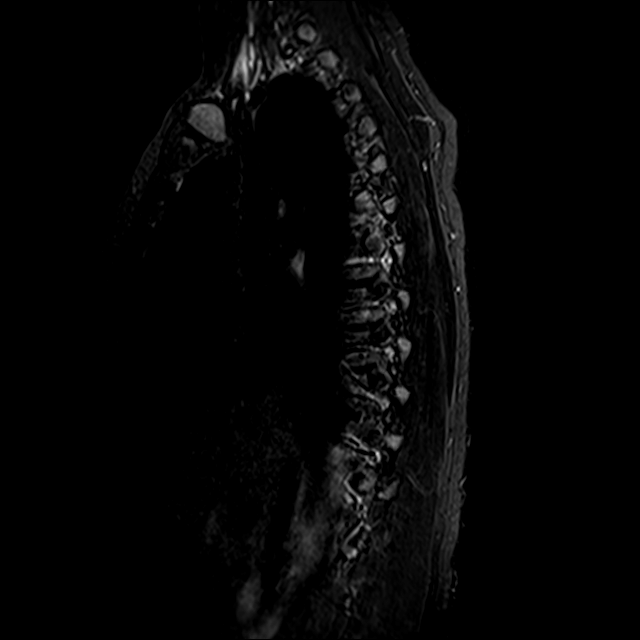
[im 19/19]
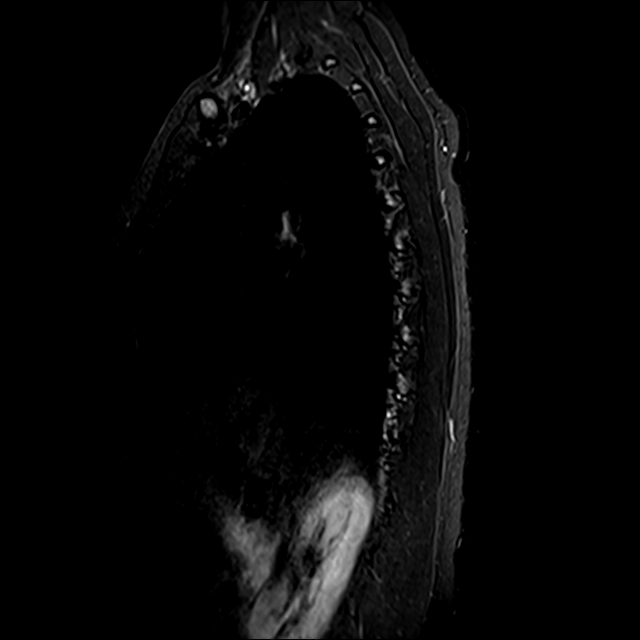

[Series 22: T2 · axial · 4.0mm · 0.59mm/px · z∈[-213,+75]mm · 8 of 47 slices shown (2 of 2)]
[im 1/47]
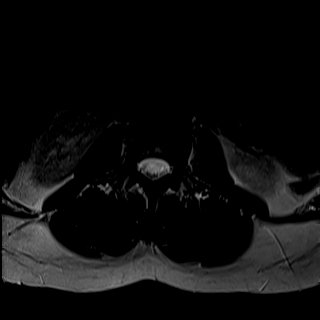
[im 8/47]
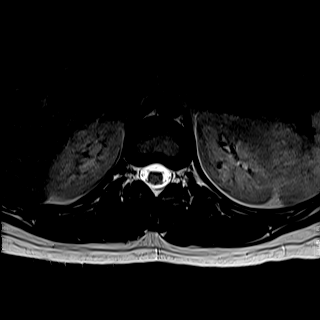
[im 15/47]
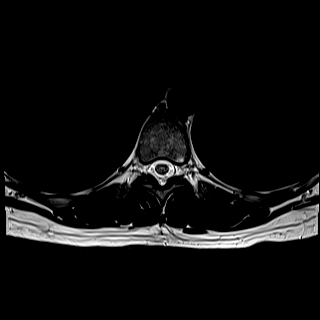
[im 22/47]
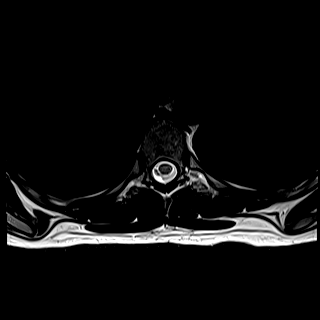
[im 25/47]
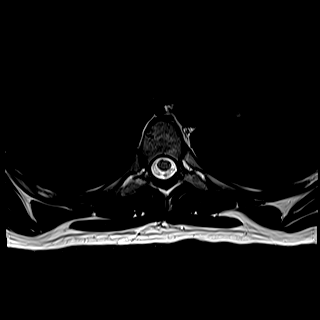
[im 32/47]
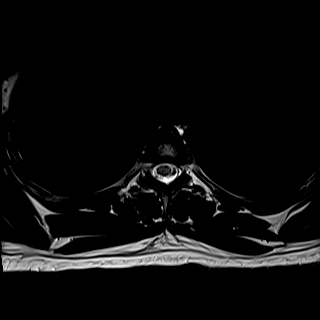
[im 39/47]
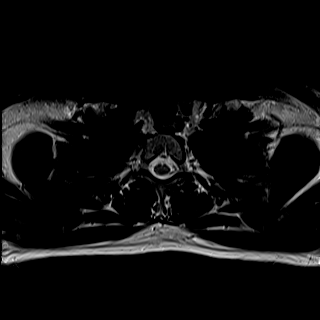
[im 47/47]
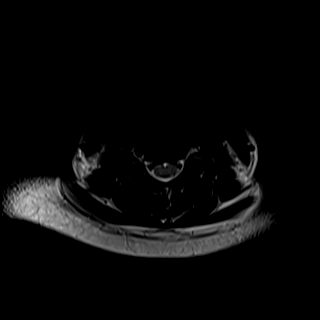

[Series 23: GRE · axial · 4.0mm · 0.37mm/px · z∈[-213,-153]mm · 2 of 47 slices shown]
[im 1/47]
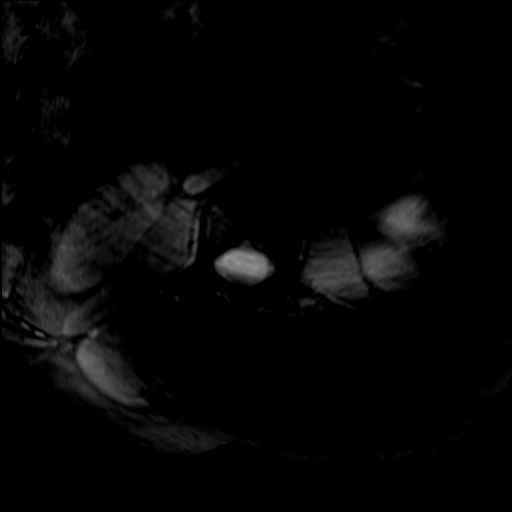
[im 8/47]
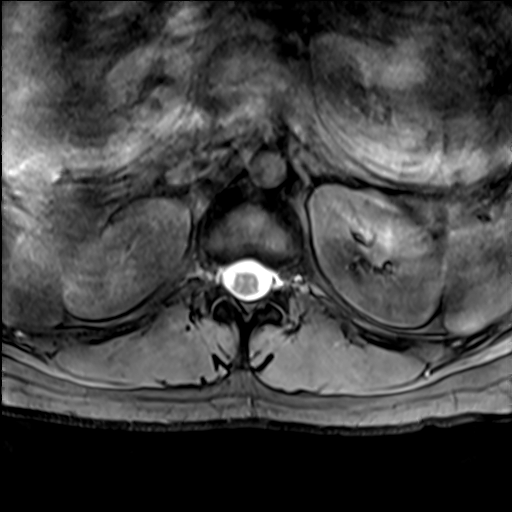

[30 of 48 positions shown; findings below may reference images not displayed]

FINDINGS: Alignment:  Physiologic.

Vertebrae: No fracture, evidence of discitis, or bone lesion.

Cord: There is a focal hyperintense T2-weighted signal lesion the
thoracic spinal cord at the T5 level. Craniocaudal length of the
lesion is approximately the height of the vertebral body. Lesion
predominantly affects the right hemicord. There are no other lesions
within the thoracic spinal cord.

Paraspinal and other soft tissues: Negative

Disc levels:

The discs are normal. There is no spinal canal or neural foraminal
stenosis.
IMPRESSION: Focal hyperintense T2-weighted signal lesion within the thoracic
spinal cord at the T5 level, most consistent with demyelinating
disease. Further imaging with gadolinium administration may be
helpful.

These results were discussed by telephone at the time of
interpretation on [DATE] at [DATE] with Dr. PIQUANT , who
verbally acknowledged these results.

## 2021-06-06 IMAGING — MR MR THORACIC SPINE W/ CM
4 of 5 series · 24 of 48 positions shown · IV contrast (6ml Gadavist)
Comparison: Noncontrast thoracic MRI from earlier the same day

CLINICAL DATA: Multiple sclerosis, new event

EXAM:
MRI THORACIC SPINE WITH CONTRAST
TECHNIQUE: Multiplanar, multisequence MR imaging of the thoracic spine was
performed following the administration of intravenous contrast.

[Series 17: T1 · sagittal · 5.0mm · 1.88mm/px · 4 of 9 slices shown (1 of 2)]
[im 1/9]
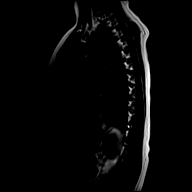
[im 3/9]
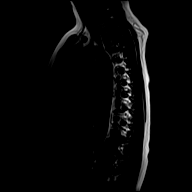
[im 6/9]
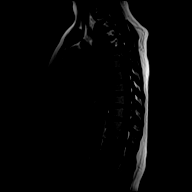
[im 9/9]
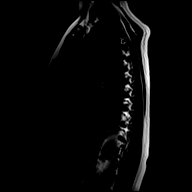

[Series 19: STIR · sagittal · 3.0mm · 0.53mm/px · 4 of 18 slices shown]
[im 1/18]
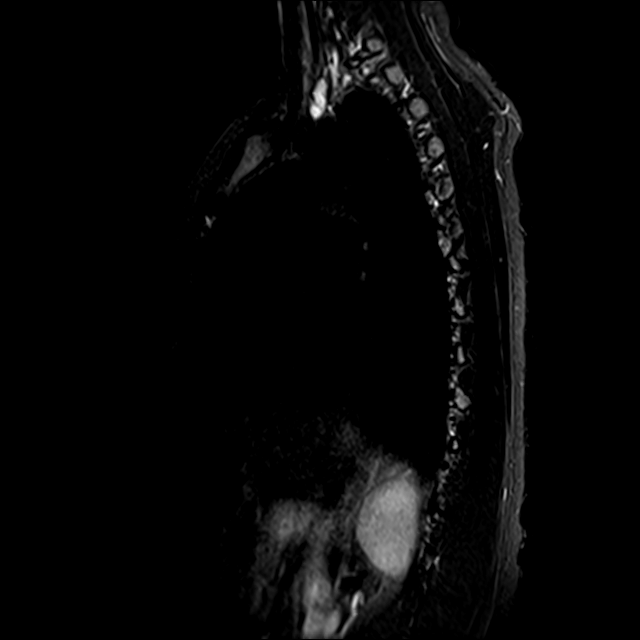
[im 4/18]
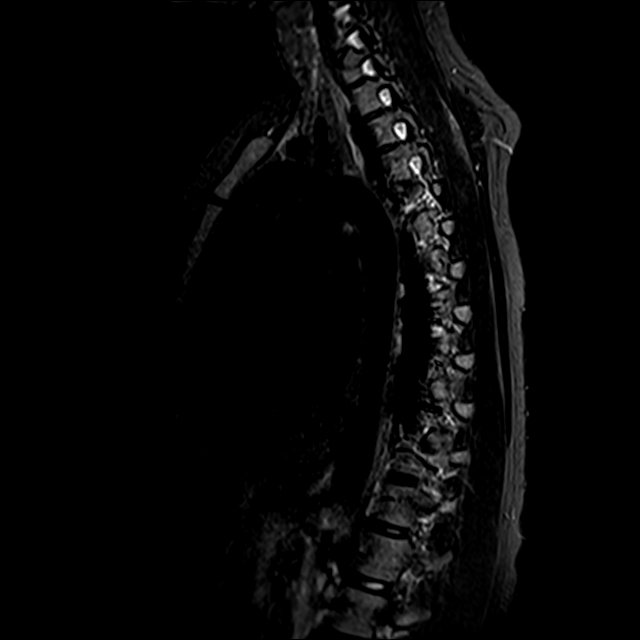
[im 11/18]
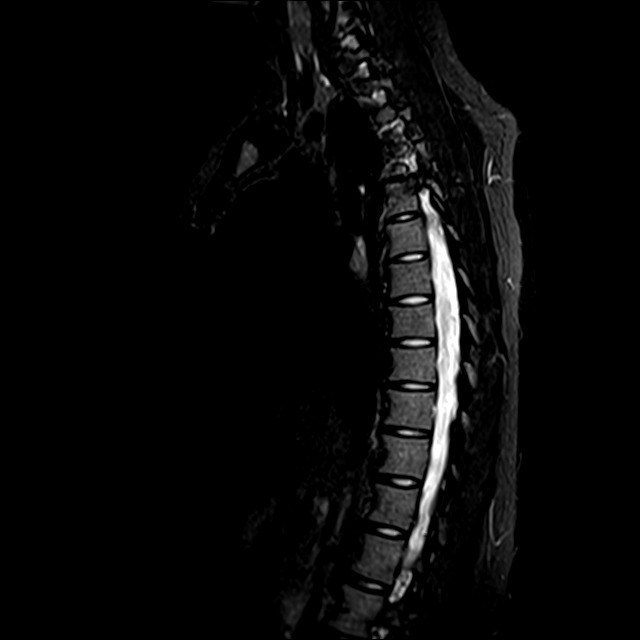
[im 18/18]
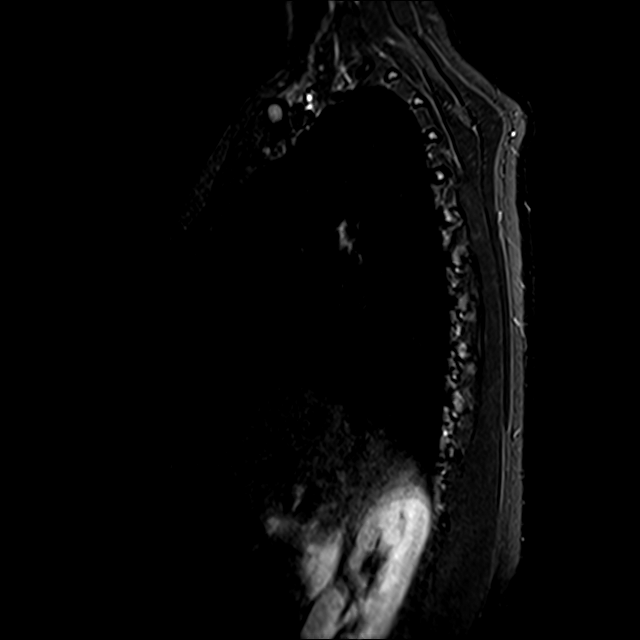

[Series 20: T1 · axial · non-contrast · 4.0mm · 0.37mm/px · z∈[-465,-187]mm · 10 of 48 slices shown (2 of 2)]
[im 4/48]
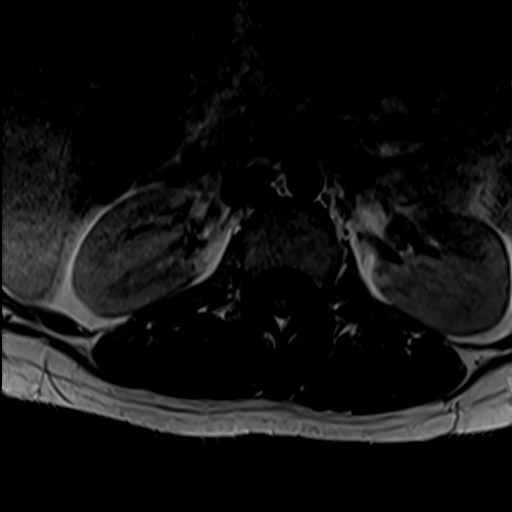
[im 7/48]
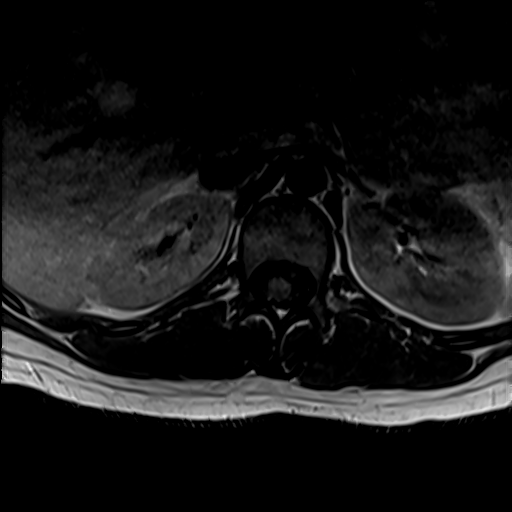
[im 10/48]
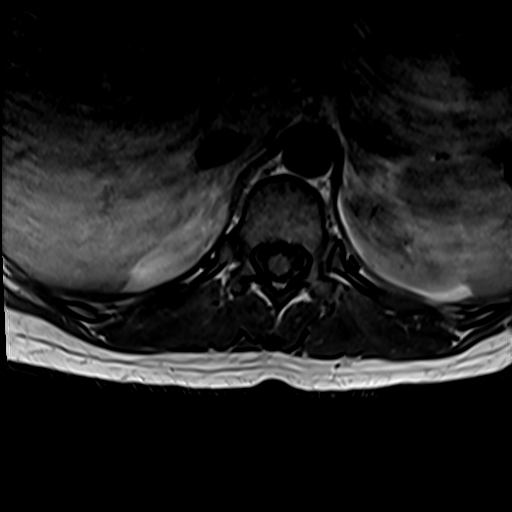
[im 16/48]
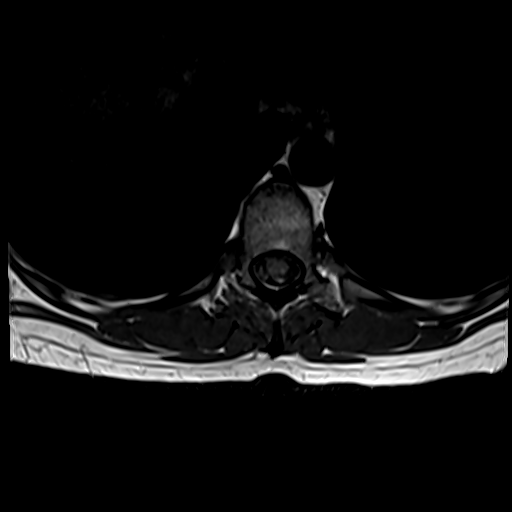
[im 22/48]
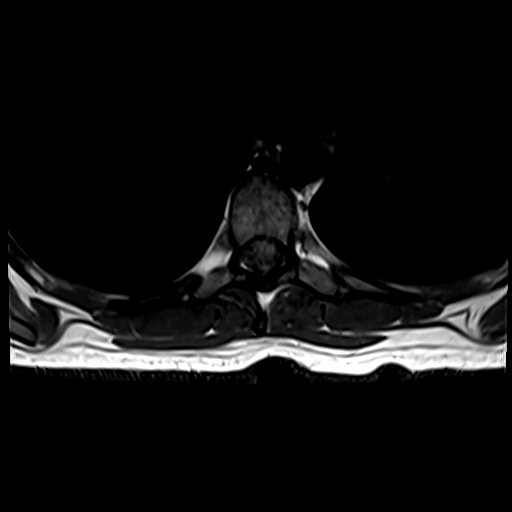
[im 26/48]
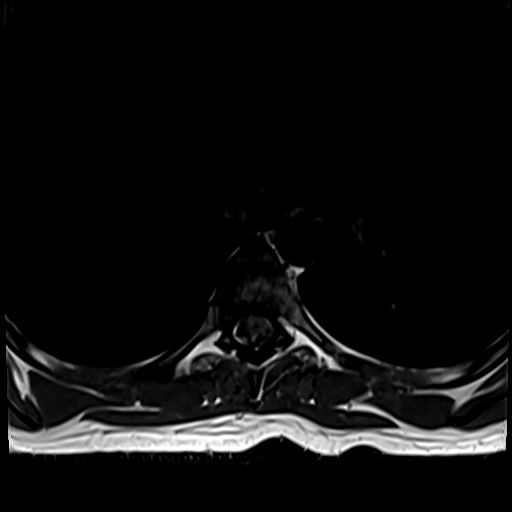
[im 29/48]
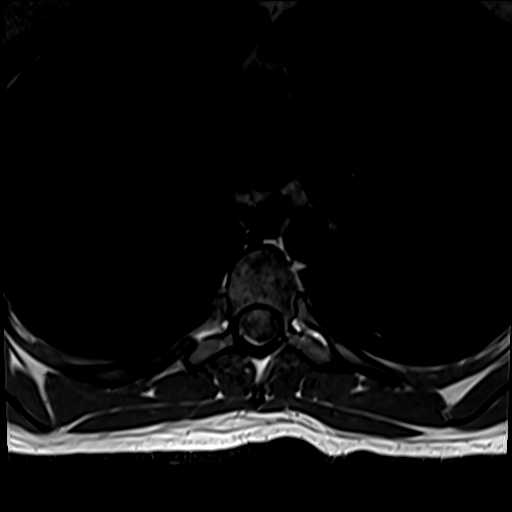
[im 35/48]
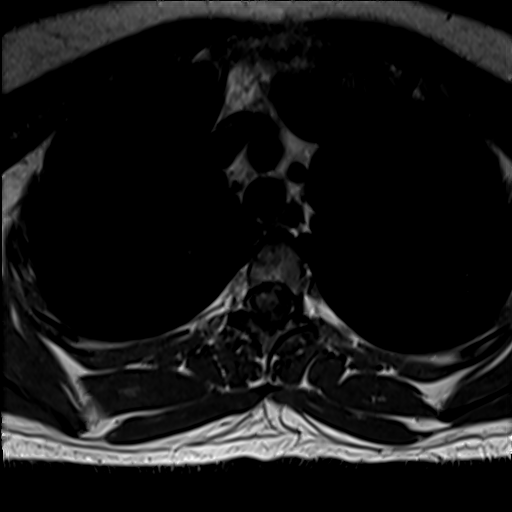
[im 41/48]
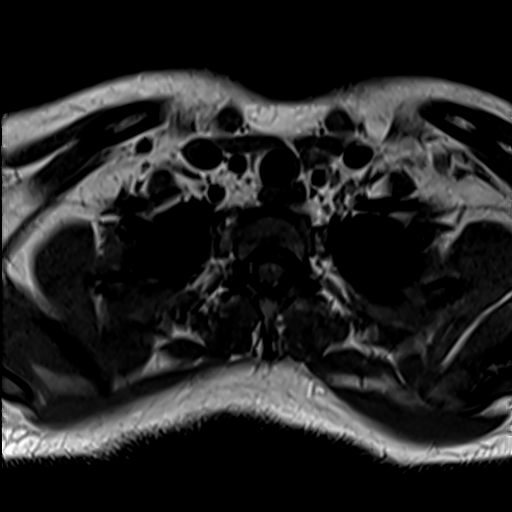
[im 48/48]
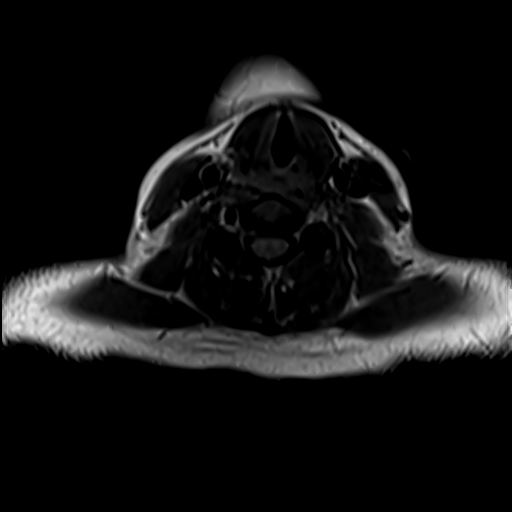

[Series 22: T1 fat-sat post-contrast · sagittal · 3.0mm · 1.06mm/px · 6 of 18 slices shown]
[im 1/18]
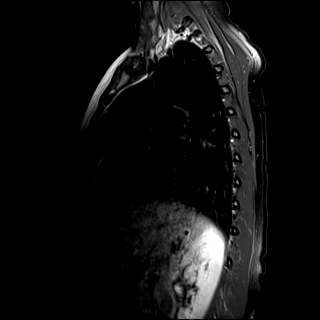
[im 4/18]
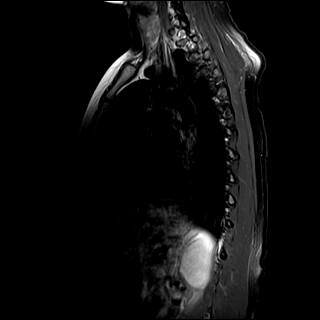
[im 7/18]
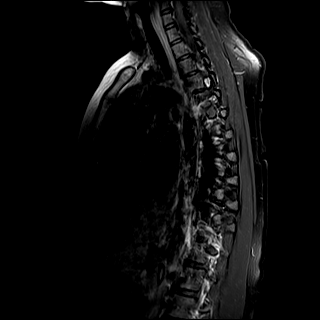
[im 11/18]
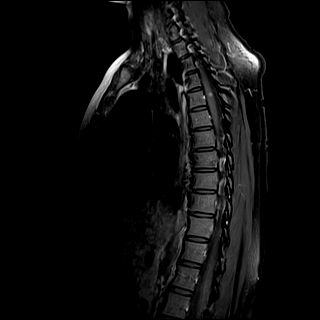
[im 14/18]
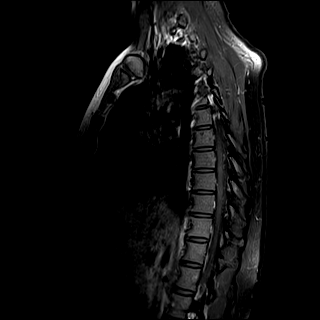
[im 18/18]
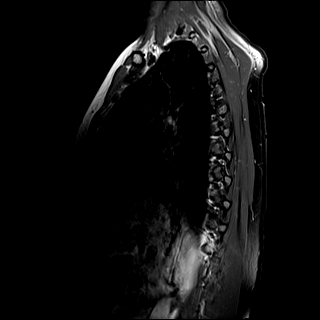

[24 of 48 positions shown; findings below may reference images not displayed]

FINDINGS: Alignment:  Mild scoliosis.

Vertebrae: No fracture, evidence of discitis, or bone lesion.

Cord: The area of T2 hyperintensity in the cord at T5 shows a
subcentimeter area of nodular enhancement, compatible with active
disease. No other areas of cord enhancement and no cord expansion.

Paraspinal and other soft tissues: Negative

Disc levels:

Better assessed on prior precontrast series.
IMPRESSION: The short segment T5 cord lesion is enhancing and compatible with
active disease.

## 2021-06-06 MED ORDER — ONDANSETRON HCL 4 MG PO TABS: 4.0000 mg | ORAL_TABLET | Freq: Four times a day (QID) | ORAL | Status: DC | PRN

## 2021-06-06 MED ORDER — GADOBUTROL 1 MMOL/ML IV SOLN
6.0000 mL | Freq: Once | INTRAVENOUS | Status: AC | PRN
  Administered 2021-06-06: 6 mL via INTRAVENOUS

## 2021-06-06 MED ORDER — ONDANSETRON HCL 4 MG/2ML IJ SOLN
4.0000 mg | Freq: Four times a day (QID) | INTRAMUSCULAR | Status: DC | PRN
Start: 2021-06-06 — End: 2021-06-07

## 2021-06-06 MED ORDER — ACETAMINOPHEN 325 MG PO TABS
650.0000 mg | ORAL_TABLET | Freq: Four times a day (QID) | ORAL | Status: DC | PRN
  Administered 2021-06-06: 650 mg via ORAL
  Filled 2021-06-06: qty 2

## 2021-06-06 MED ORDER — ENOXAPARIN SODIUM 40 MG/0.4ML IJ SOSY: 40.0000 mg | PREFILLED_SYRINGE | INTRAMUSCULAR | Status: DC

## 2021-06-06 MED ORDER — SODIUM CHLORIDE 0.9 % IV SOLN
1000.0000 mg | Freq: Every day | INTRAVENOUS | Status: DC
  Administered 2021-06-06: 22:00:00 1000 mg via INTRAVENOUS
  Filled 2021-06-06 (×2): qty 1000

## 2021-06-06 MED ORDER — ACETAMINOPHEN 650 MG RE SUPP: 650.0000 mg | Freq: Four times a day (QID) | RECTAL | Status: DC | PRN

## 2021-06-06 MED ORDER — METHYLPREDNISOLONE SODIUM SUCC 125 MG IJ SOLR
1000.0000 mg | Freq: Every day | INTRAMUSCULAR | Status: DC
Start: 2021-06-06 — End: 2021-06-06
  Administered 2021-06-06: 1000 mg via INTRAVENOUS
  Filled 2021-06-06 (×2): qty 16
  Filled 2021-06-06: qty 8

## 2021-06-06 NOTE — ED Notes (Signed)
Informed RN bed assigned 

## 2021-06-06 NOTE — ED Notes (Signed)
Pt. To 12 hall, resting in bed comfortably, denies need currently, NAD.

## 2021-06-06 NOTE — Consult Note (Signed)
NEUROLOGY CONSULTATION NOTE   Date of service: June 06, 2021 Patient Name: Jessica Mcintosh MRN:  [id number] DOB:  Feb 29, 1991 Reason for consult: RLE weakness and numbness with abnormal CNS MRI imaging Requesting physician: Dr. Shonna Chock _ _ _   _ __   _ __ _ _  __ __   _ __   __ _  History of Present Illness   This is a 30 year old woman with a history of ADD, ovarian cyst, and migraines who presented to the emergency department yesterday reporting 1 week of right lower extremity numbness, paresthesias, and slight weakness.  She has had some difficulty with ambulation due to shooting pains that she feels in her right lower extremity when she does so.  All of this is new for her.  She denies any other focal neurologic deficits and reports no history of neurologic deficits transient or permanent.  Specifically she denies any other focal weakness or numbness, history of vision loss or pain on eye movement, dizziness, difficulty speaking.  She does have a history of migraine headaches which are infrequent and typically resolve with ibuprofen.  Teleneurology was consulted in the emergency department and recommended imaging of her brain, C, and T-spine with and without contrast due to concern for possible multiple sclerosis.  She does not have a history of prior diagnosis of this.  Brain MRI was notable for multiple small foci of nonenhancing T2 flair hyperintensity most consistent with demyelinating disease.  MRI C-spine did not show any lesions.  MRI T-spine showed a small subcentimeter enhancing T2 flair hyperintensity lesion at T5.  Patient was admitted for IV Solu-Medrol for probable initial presentation of multiple sclerosis.  CNS imaging personally reviewed.   ROS   Per HPI; all other systems reviewed and are negative  Past History   Past Medical History:  Diagnosis Date  . ADD (attention deficit disorder) 05/28/2020  . Allergy   . Ovarian cyst   . Preeclampsia 2018   Past Surgical  History:  Procedure Laterality Date  . NO PAST SURGERIES     Family History  Problem Relation Age of Onset  . Depression Mother   . Hypertension Mother   . COPD Mother   . Depression Maternal Aunt   . Hypertension Maternal Aunt   . Cancer Paternal Uncle   . Lung cancer Paternal Uncle   . Breast cancer Maternal Grandmother   . Depression Maternal Grandmother   . Cancer Maternal Grandfather   . Lung cancer Maternal Grandfather   . Cancer Paternal Grandfather   . Skin cancer Paternal Grandfather   . Alcoholism Father   . Hypertension Father   . Ovarian cancer Neg Hx   . Colon cancer Neg Hx    Social History   Socioeconomic History  . Marital status: Married    Spouse name: Not on file  . Number of children: Not on file  . Years of education: Not on file  . Highest education level: Not on file  Occupational History  . Not on file  Tobacco Use  . Smoking status: Never  . Smokeless tobacco: Never  Vaping Use  . Vaping Use: Never used  Substance and Sexual Activity  . Alcohol use: No  . Drug use: No  . Sexual activity: Yes    Birth control/protection: None  Other Topics Concern  . Not on file  Social History Narrative  . Not on file   Social Determinants of Health   Financial Resource Strain: Not on file  Food Insecurity: Not on file  Transportation Needs: Not on file  Physical Activity: Not on file  Stress: Not on file  Social Connections: Not on file   Allergies  Allergen Reactions  . Aspirin Hives, Itching, Rash, Shortness Of Breath and Swelling  . Penicillins Hives and Rash    Medications    Current Facility-Administered Medications:  .  acetaminophen (TYLENOL) tablet 650 mg, 650 mg, Oral, Q6H PRN, 650 mg at 06/06/21 0450 **OR** acetaminophen (TYLENOL) suppository 650 mg, 650 mg, Rectal, Q6H PRN, Andris Baumann, MD .  methylPREDNISolone sodium succinate (SOLU-MEDROL) 1,000 mg in sodium chloride 0.9 % 50 mL IVPB, 1,000 mg, Intravenous, Daily, Foye Deer, RPH .  ondansetron (ZOFRAN) tablet 4 mg, 4 mg, Oral, Q6H PRN **OR** ondansetron (ZOFRAN) injection 4 mg, 4 mg, Intravenous, Q6H PRN, Andris Baumann, MD  Current Outpatient Medications:  .  ADDERALL XR 30 MG 24 hr capsule, Take 30 mg by mouth every morning., Disp: , Rfl:  .  ELDERBERRY PO, Take 1 tablet by mouth every evening., Disp: , Rfl:  .  ibuprofen (ADVIL) 800 MG tablet, Take 1 tablet (800 mg total) by mouth every 8 (eight) hours as needed., Disp: 60 tablet, Rfl: 1 .  Multiple Vitamins-Minerals (MULTIVITAMIN ADULTS PO), Take by mouth., Disp: , Rfl:  .  doxycycline (VIBRAMYCIN) 100 MG capsule, Take 1 capsule (100 mg total) by mouth 2 (two) times daily. (Patient not taking: Reported on 06/06/2021), Disp: 14 capsule, Rfl: 0 .  fluconazole (DIFLUCAN) 150 MG tablet, Take 1 tablet (150 mg total) by mouth daily. Repeat in 3 days if needed. (Patient not taking: No sig reported), Disp: 2 tablet, Rfl: 0 .  fluconazole (DIFLUCAN) 200 MG tablet, Take 1 tablet (200 mg total) by mouth daily. Take two tablets on first day, then one tablet daily to complete 14 day regimen (Patient not taking: Reported on 06/06/2021), Disp: 15 tablet, Rfl: 1 .  ibuprofen (ADVIL) 800 MG tablet, Take 1 tablet (800 mg total) by mouth every 8 (eight) hours as needed. (Patient not taking: No sig reported), Disp: 60 tablet, Rfl: 1 .  metroNIDAZOLE (FLAGYL) 500 MG tablet, Take 1 tablet (500 mg total) by mouth 2 (two) times daily. (Patient not taking: No sig reported), Disp: 14 tablet, Rfl: 0     Vitals   Vitals:   06/06/21 0003 06/06/21 1100 06/06/21 1240 06/06/21 1412  BP: 132/81     Pulse: (!) 105 (!) 101 97 93  Resp: 18 (!) 23    Temp:      TempSrc:      SpO2: 100% 97%  96%  Weight:      Height:         Body mass index is 24.94 kg/m.  Physical Exam   Physical Exam Gen: A&O x4, NAD HEENT: Atraumatic, normocephalic;mucous membranes moist; oropharynx clear, tongue without atrophy or fasciculations. Neck:  Supple, trachea midline. Resp: CTAB, no w/r/r CV: RRR, no m/g/r; nml S1 and S2. 2+ symmetric peripheral pulses. Abd: soft/NT/ND; nabs x 4 quad Extrem: Nml bulk; no cyanosis, clubbing, or edema.  Neuro: *MS: A&O x4. Follows multi-step commands.  *Speech: fluid, nondysarthric, able to name and repeat *CN:    I: Deferred   II,III: PERRLA, VFF by confrontation, optic discs sharp   III,IV,VI: EOMI w/o nystagmus, no ptosis   V: Sensation intact from V1 to V3 to LT   VII: Eyelid closure was full.  Smile symmetric.   VIII: Hearing intact to voice  IX,X: Voice normal, palate elevates symmetrically    XI: SCM/trap 5/5 bilat   XII: Tongue protrudes midline, no atrophy or fasciculations   *Motor:   Normal bulk.  No tremor, rigidity or bradykinesia. No pronator drift.    Strength: Dlt Bic Tri WrE WrF FgS Gr HF KnF KnE PlF DoF    Left 5 5 5 5 5 5 5 5 5 5 5 5     Right 5 5 5 5 5 5 5 5 5 5 5 5     *Sensory: Hyperesthesia and sensory deficit to PP in RLE, otherwise intact to all modalities *Coordination:  Finger-to-nose, heel-to-shin, rapid alternating motions were intact. *Reflexes:  2+ brisk BUE, 3+ symmetric bilateral patellae, nonsustained clonus bilateral achilles, toes mute bilat *Gait: deferred    Labs   CBC:  Recent Labs  Lab 06/06/21 0031  WBC 12.8*  NEUTROABS 8.8*  HGB 14.4  HCT 44.2  MCV 91.7  PLT 304    Basic Metabolic Panel:  Lab Results  Component Value Date   NA 137 06/06/2021   K 3.4 (L) 06/06/2021   CO2 24 06/06/2021   GLUCOSE 91 06/06/2021   BUN 8 06/06/2021   CREATININE 0.59 06/06/2021   CALCIUM 9.0 06/06/2021   GFRNONAA >60 06/06/2021   GFRAA 143 03/20/2020   Lipid Panel:  Lab Results  Component Value Date   LDLCALC 81 03/20/2020   HgbA1c:  Lab Results  Component Value Date   HGBA1C 4.9 05/27/2018   Urine Drug Screen: No results found for: LABOPIA, COCAINSCRNUR, LABBENZ, AMPHETMU, THCU, LABBARB  Alcohol Level No results found for:  ETH   Impression   This is a 30 year old woman with a history of ADD, ovarian cyst, and migraines who presented to the emergency department yesterday reporting 1 week of right lower extremity numbness, paresthesias, and slight weakness.  Imaging significant for multiple small nonenhancing lesions in the brain concerning for demyelinating disease and enhancing lesion at T5 concerning for active demyelination.  This meets clinical criteria for for diagnosis of MS with lesions separated in space and time.  She is admitted for 5 days of IV Solu-Medrol for acute MS exacerbation.  She will be referred to outpatient neurology at time of discharge where she can establish care and also discussed disease modifying therapy going forward.  Teleneurology overnight recommended lumbar puncture for NMO autoantibodies however her lesions on MRI are not consistent with NMO (would be longitudinally extensive spinal cord lesions vs hers which is v small) therefore I do not feel LP is indicated at this time.  Recommendations   - IV solumedrol 1g q 24 hrs x5 days - Referral to outpatient neurology at hospital discharge - Will continue to follow ______________________________________________________________________   Thank you for the opportunity to take part in the care of this patient. If you have any further questions, please contact the neurology consultation attending.  Signed,  07/27/2018, MD [facility] [phone]  If 7pm- 7am, please page neurology on call as listed in [facility].

## 2021-06-06 NOTE — Progress Notes (Signed)
Not for billing  Patient admitted earlier this morning. No significant PMH. Presenting with one week right leg weakness/numbness. MRI of brain and thoracic spine with lesions consistent w/ possible demyelinating condition such as MS. Neurology consulted. High-dose corticosteroids ordered. LP ordered overnight, advised by teleneuro. Will f/u further neuro recs when available.

## 2021-06-06 NOTE — ED Notes (Signed)
After speaking with Dr Para March, pt gave verbal consent to send covid test to the laboratory.

## 2021-06-06 NOTE — Consult Note (Signed)
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 Presentation likely secondary to demyelinating process in thoracic spine as seen on MRI possibly from MS vs NMO vs other autoimmune demyelinating process.  CT HEAD: Showed No Acute Hemorrhage or Acute Core Infarct  Our recommendations are outlined below.  Diagnostic Studies: MRI Brain with and without contrast MRI C spine with and without contrast  Free Text Diagnostics: MRI T-spine w/ contrast  Free Text labs: CSF Studies: protein, glucose, cell count, myelin basic protein, IgG, Oligoclonal bands, NMO ab, lyme, HSV, VZV, cytology, and flow cytometry  Medications: Solumedrol 1g daily  Free Text meds Consultations: Toxic metabolic work up per primary team  DVT Prophylaxis: Choice of Primary Team.  Disposition: Neurology will follow.   Metrics: [facility] Notification Time: 06/06/2021 00:58:03 Stamp Time: 06/06/2021 01:00:00 Callback Response Time: 06/06/2021 01:08:17   ----------------------------------------------------------------------------------------------------  Chief Complaint: Right leg numbness  History of Present Illness: Patient is a 30 year old Female.  30 year old female who presents with right leg numbness and weakness x 6 days. She has never had similar symptoms in the past but does have a history of headaches. MRI Thoracic spine w/o contrast showed a possible demyelinating lesion.          Examination: BP(132/81), Pulse(105),   Neuro Exam: General: Alert,Awake, Oriented to Time, Place, Person  Speech: Fluent:  Language: Intact:  Face: Symmetric:  Facial Sensation: Intact:  Visual Fields: Intact:  Extraocular Movements: Intact:  Motor Exam: Drift:  RLE  Sensation: Reduced: RLE  Coordination: Intact:  reduced reflex in right leg     Patient / Family was informed the Neurology Consult would occur via TeleHealth consult by way of interactive audio and video telecommunications and consented to receiving care in this manner.  Patient is being evaluated for possible acute neurologic impairment and high probability of imminent or life - threatening deterioration.I spent total of 30 minutes providing care to this patient, including time for face to face visit via telemedicine, review of medical records, imaging studies and discussion of findings with providers, the patient and / or family.   Dr Joice Lofts   [facility] [phone]  Case [id number]

## 2021-06-06 NOTE — ED Notes (Signed)
After collecting respiratory panel nasal swab, Pt voiced concern over religious reservations about submitting the test. Pt was informed of hospital policy requiring the test prior to admission but also of her right to accept or refuse any part of her care. She then stated that she would like to call her husband to come pick her up. This nurse informed her that she could leave if that was her decision and informed of the potential complications of leaving AMA. Pt then asked to have the covid testing tube left in the room until she could talk to the doctor. Dr Para March informed.

## 2021-06-06 NOTE — H&P (Addendum)
History and Physical    Jessica Mcintosh MHD:[id number] DOB: 1991-03-03 DOA: 06/05/2021  PCP: Berniece Pap, FNP   Patient coming from: home  I have personally briefly reviewed patient's old medical records in [facility]  Chief Complaint: Right-sided weakness  HPI: Jessica Mcintosh is a 30 y.o. female with no significant past medical history who presents with progressive numbness tingling and weakness of the right lower extremity now ascending into the low back.  She denies any recent illnesses, recent vaccines.  Has no fever or chills, headache or visual disturbance, neck pain or back pain.  Denies recent injury  ED course: Afebrile, BP 134/87, pulse 103 O2 sat 100% on room air Blood work significant for leukocytosis of 12,800.  BMP pending.  Urinalysis with moderate leukocyte esterase  Imaging: MRI thoracolumbar spine significant for potential demyelinating lesion of the thoracic spine  Teleneurology consult was done from the ED with recommendation to start high-dose Solu-Medrol 1 g daily for 5 days for possible MS with LP in the a.m. under fluoroscopy.  Hospitalist consulted for admission.    Review of Systems: As per HPI otherwise all other systems on review of systems negative.    Past Medical History:  Diagnosis Date   ADD (attention deficit disorder) 05/28/2020   Allergy    Ovarian cyst    Preeclampsia 2018    Past Surgical History:  Procedure Laterality Date   NO PAST SURGERIES       reports that she has never smoked. She has never used smokeless tobacco. She reports that she does not drink alcohol and does not use drugs.  Allergies  Allergen Reactions   Aspirin Hives, Itching, Rash, Shortness Of Breath and Swelling   Penicillins Hives and Rash    Family History  Problem Relation Age of Onset   Depression Mother    Hypertension Mother    COPD Mother    Depression Maternal Aunt    Hypertension Maternal Aunt    Cancer Paternal Uncle    Lung cancer  Paternal Uncle    Breast cancer Maternal Grandmother    Depression Maternal Grandmother    Cancer Maternal Grandfather    Lung cancer Maternal Grandfather    Cancer Paternal Grandfather    Skin cancer Paternal Grandfather    Alcoholism Father    Hypertension Father    Ovarian cancer Neg Hx    Colon cancer Neg Hx       Prior to Admission medications   Medication Sig Start Date End Date Taking? Authorizing Provider  ADDERALL XR 30 MG 24 hr capsule Take 30 mg by mouth every morning. 05/13/21  Yes [provider]  ELDERBERRY PO Take 1 tablet by mouth every evening.   Yes [provider]  ibuprofen (ADVIL) 800 MG tablet Take 1 tablet (800 mg total) by mouth every 8 (eight) hours as needed. 07/26/20  Yes Lawhorn, Vanessa [place], CNM  Multiple Vitamins-Minerals (MULTIVITAMIN ADULTS PO) Take by mouth.   Yes [provider]  doxycycline (VIBRAMYCIN) 100 MG capsule Take 1 capsule (100 mg total) by mouth 2 (two) times daily. Patient not taking: Reported on 06/06/2021 08/08/20   Gunnar Bulla, CNM  fluconazole (DIFLUCAN) 150 MG tablet Take 1 tablet (150 mg total) by mouth daily. Repeat in 3 days if needed. Patient not taking: No sig reported 06/20/20   Gunnar Bulla, CNM  fluconazole (DIFLUCAN) 200 MG tablet Take 1 tablet (200 mg total) by mouth daily. Take two tablets on first day, then  one tablet daily to complete 14 day regimen Patient not taking: Reported on 06/06/2021 07/26/20   Gunnar Bulla, CNM  ibuprofen (ADVIL) 800 MG tablet Take 1 tablet (800 mg total) by mouth every 8 (eight) hours as needed. Patient not taking: No sig reported 05/30/19   Gunnar Bulla, CNM  metroNIDAZOLE (FLAGYL) 500 MG tablet Take 1 tablet (500 mg total) by mouth 2 (two) times daily. Patient not taking: No sig reported 06/20/20   Gunnar Bulla, CNM    Physical Exam: Vitals:   06/05/21 1820 06/05/21 2246 06/06/21 0003  BP: (!) 146/86  134/87 132/81  Pulse: (!) 102 (!) 103 (!) 105  Resp: 18 20 18   Temp: 99 F (37.2 C) 98.8 F (37.1 C)   TempSrc: Oral Oral   SpO2: 100% 100% 100%  Weight: 59.9 kg    Height: 5\' 1"  (1.549 m)       Vitals:   06/05/21 1820 06/05/21 2246 06/06/21 0003  BP: (!) 146/86 134/87 132/81  Pulse: (!) 102 (!) 103 (!) 105  Resp: 18 20 18   Temp: 99 F (37.2 C) 98.8 F (37.1 C)   TempSrc: Oral Oral   SpO2: 100% 100% 100%  Weight: 59.9 kg    Height: 5\' 1"  (1.549 m)        Constitutional: Alert and oriented x 3 . Not in any apparent distress HEENT:      Head: Normocephalic and atraumatic.         Eyes: PERLA, EOMI, Conjunctivae are normal. Sclera is non-icteric.       Mouth/Throat: Mucous membranes are moist.       Neck: Supple with no signs of meningismus. Cardiovascular: Regular rate and rhythm. No murmurs, gallops, or rubs. 2+ symmetrical distal pulses are present . No JVD. No LE edema Respiratory: Respiratory effort normal .Lungs sounds clear bilaterally. No wheezes, crackles, or rhonchi.  Gastrointestinal: Soft, non tender, and non distended with positive bowel sounds.  Genitourinary: No CVA tenderness. Musculoskeletal: Nontender with normal range of motion in all extremities. No cyanosis, or erythema of extremities. Neurologic:  Face is symmetric. Moving all extremities. No gross focal neurologic deficits . Skin: Skin is warm, dry.  No rash or ulcers Psychiatric: Anxious appearing    Labs on Admission: I have personally reviewed following labs and imaging studies  CBC: Recent Labs  Lab 06/06/21 0031  WBC 12.8*  NEUTROABS 8.8*  HGB 14.4  HCT 44.2  MCV 91.7  PLT 304   Basic Metabolic Panel: No results for input(s): NA, K, CL, CO2, GLUCOSE, BUN, CREATININE, CALCIUM, MG, PHOS in the last 168 hours. GFR: CrCl cannot be calculated (Patient's most recent lab result is older than the maximum 21 days allowed.). Liver Function Tests: No results for input(s): AST, ALT,  ALKPHOS, BILITOT, PROT, ALBUMIN in the last 168 hours. No results for input(s): LIPASE, AMYLASE in the last 168 hours. No results for input(s): AMMONIA in the last 168 hours. Coagulation Profile: No results for input(s): INR, PROTIME in the last 168 hours. Cardiac Enzymes: No results for input(s): CKTOTAL, CKMB, CKMBINDEX, TROPONINI in the last 168 hours. BNP (last 3 results) No results for input(s): PROBNP in the last 8760 hours. HbA1C: No results for input(s): HGBA1C in the last 72 hours. CBG: No results for input(s): GLUCAP in the last 168 hours. Lipid Profile: No results for input(s): CHOL, HDL, LDLCALC, TRIG, CHOLHDL, LDLDIRECT in the last 72 hours. Thyroid Function Tests: No results for input(s): TSH, T4TOTAL, FREET4, T3FREE, THYROIDAB  in the last 72 hours. Anemia Panel: No results for input(s): VITAMINB12, FOLATE, FERRITIN, TIBC, IRON, RETICCTPCT in the last 72 hours. Urine analysis:    Component Value Date/Time   COLORURINE YELLOW (A) 06/05/2021 1825   APPEARANCEUR HAZY (A) 06/05/2021 1825   APPEARANCEUR Clear 03/20/2020 1130   LABSPEC 1.005 06/05/2021 1825   PHURINE 6.0 06/05/2021 1825   GLUCOSEU NEGATIVE 06/05/2021 1825   HGBUR NEGATIVE 06/05/2021 1825   BILIRUBINUR NEGATIVE 06/05/2021 1825   BILIRUBINUR moderate (A) 10/11/2020 1125   BILIRUBINUR Negative 03/20/2020 1130   KETONESUR NEGATIVE 06/05/2021 1825   PROTEINUR NEGATIVE 06/05/2021 1825   UROBILINOGEN >=8.0 (A) 10/11/2020 1125   NITRITE NEGATIVE 06/05/2021 1825   LEUKOCYTESUR MODERATE (A) 06/05/2021 1825    Radiological Exams on Admission: DG Lumbar Spine 2-3 Views  Result Date: 06/05/2021 CLINICAL DATA:  Right leg numbness and back pain. EXAM: LUMBAR SPINE - 2-3 VIEW COMPARISON:  None. FINDINGS: Five lumbar type vertebra. There is no acute fracture or subluxation of lumbar spine. The vertebral body heights and disc spaces are maintained. The visualized posterior elements are intact. A 3 mm radiopaque focus  over the left flank may represent a kidney stone. The soft tissues are unremarkable. IMPRESSION: 1. No acute/traumatic lumbar spine pathology. 2. Possible 3 mm left renal calculus. Electronically Signed   By: Elgie Collard M.D.   On: 06/05/2021 22:43   MR THORACIC SPINE WO CONTRAST  Result Date: 06/06/2021 CLINICAL DATA:  Left lower extremity weakness EXAM: MRI THORACIC SPINE WITHOUT CONTRAST TECHNIQUE: Multiplanar, multisequence MR imaging of the thoracic spine was performed. No intravenous contrast was administered. COMPARISON:  None. FINDINGS: Alignment:  Physiologic. Vertebrae: No fracture, evidence of discitis, or bone lesion. Cord: There is a focal hyperintense T2-weighted signal lesion the thoracic spinal cord at the T5 level. Craniocaudal length of the lesion is approximately the height of the vertebral body. Lesion predominantly affects the right hemicord. There are no other lesions within the thoracic spinal cord. Paraspinal and other soft tissues: Negative Disc levels: The discs are normal. There is no spinal canal or neural foraminal stenosis. IMPRESSION: Focal hyperintense T2-weighted signal lesion within the thoracic spinal cord at the T5 level, most consistent with demyelinating disease. Further imaging with gadolinium administration may be helpful. These results were discussed by telephone at the time of interpretation on 06/06/2021 at 2:15 am with Dr. Rochele Raring , who verbally acknowledged these results. Electronically Signed   By: Deatra Robinson M.D.   On: 06/06/2021 02:16   MR LUMBAR SPINE WO CONTRAST  Result Date: 06/05/2021 CLINICAL DATA:  Low back pain radiating to the right leg EXAM: MRI LUMBAR SPINE WITHOUT CONTRAST TECHNIQUE: Multiplanar, multisequence MR imaging of the lumbar spine was performed. No intravenous contrast was administered. COMPARISON:  None. FINDINGS: Segmentation:  Standard. Alignment:  Physiologic. Vertebrae:  No fracture, evidence of discitis, or bone lesion.  Conus medullaris and cauda equina: Conus extends to the L2 level. Conus and cauda equina appear normal. Paraspinal and other soft tissues: Negative. Disc levels: No spinal canal or neural foraminal stenosis. IMPRESSION: Normal lumbar spine MRI. Electronically Signed   By: Deatra Robinson M.D.   On: 06/05/2021 23:54     Assessment/Plan 30 year old female with no significant past medical history who presents with progressive numbness and tingling  of the right lower extremity now ascending into the low back.      Acute focal neurological deficit   Demyelinating lesion on MRI thoracic spine - Per teleneurology recommendations: - MRI brain,  C-spine with and without contrast - MRI T-spine with contrast - LP in a.m. for protein, glucose, cell count, myelin basic protein, IgG, Oligoclonal bands, NMO ab, lyme, HSV, VZV, cytology, and flow cytometry - Solu-Medrol 1 g daily - Neuro consult - IR consult placed    DVT prophylaxis: SCDs Code Status: full code  Family Communication:  none  Disposition Plan: Back to previous home environment Consults called: Neurology Status:At the time of admission, it appears that the appropriate admission status for this patient is INPATIENT. This is judged to be reasonable and necessary in order to provide the required intensity of service to ensure the patient's safety given the presenting symptoms, physical exam findings, and initial radiographic and laboratory data in the context of their  Comorbid conditions.   Patient requires inpatient status due to high intensity of service, high risk for further deterioration and high frequency of surveillance required.   I certify that at the point of admission it is my clinical judgment that the patient will require inpatient hospital care spanning beyond 2 midnights     Andris Baumann MD [facility]     06/06/2021, 4:14 AM

## 2021-06-06 NOTE — ED Notes (Signed)
Called tele-neuro for consult.

## 2021-06-06 NOTE — ED Notes (Signed)
teleneuro at bedside. Dr. Elesa Massed at bedside.

## 2021-06-07 ENCOUNTER — Encounter: Payer: Self-pay | Admitting: Internal Medicine

## 2021-06-07 DIAGNOSIS — Z79899 Other long term (current) drug therapy: Secondary | ICD-10-CM | POA: Diagnosis not present

## 2021-06-07 DIAGNOSIS — M549 Dorsalgia, unspecified: Secondary | ICD-10-CM | POA: Diagnosis not present

## 2021-06-07 DIAGNOSIS — Z6372 Alcoholism and drug addiction in family: Secondary | ICD-10-CM | POA: Diagnosis not present

## 2021-06-07 DIAGNOSIS — R202 Paresthesia of skin: Secondary | ICD-10-CM | POA: Diagnosis not present

## 2021-06-07 DIAGNOSIS — R200 Anesthesia of skin: Secondary | ICD-10-CM | POA: Diagnosis not present

## 2021-06-07 DIAGNOSIS — G43909 Migraine, unspecified, not intractable, without status migrainosus: Secondary | ICD-10-CM | POA: Diagnosis present

## 2021-06-07 DIAGNOSIS — Z20822 Contact with and (suspected) exposure to covid-19: Secondary | ICD-10-CM | POA: Diagnosis present

## 2021-06-07 DIAGNOSIS — Z808 Family history of malignant neoplasm of other organs or systems: Secondary | ICD-10-CM | POA: Diagnosis not present

## 2021-06-07 DIAGNOSIS — Z811 Family history of alcohol abuse and dependence: Secondary | ICD-10-CM | POA: Diagnosis not present

## 2021-06-07 DIAGNOSIS — Z801 Family history of malignant neoplasm of trachea, bronchus and lung: Secondary | ICD-10-CM | POA: Diagnosis not present

## 2021-06-07 DIAGNOSIS — Z803 Family history of malignant neoplasm of breast: Secondary | ICD-10-CM | POA: Diagnosis not present

## 2021-06-07 DIAGNOSIS — F909 Attention-deficit hyperactivity disorder, unspecified type: Secondary | ICD-10-CM | POA: Diagnosis present

## 2021-06-07 DIAGNOSIS — Z818 Family history of other mental and behavioral disorders: Secondary | ICD-10-CM | POA: Diagnosis not present

## 2021-06-07 DIAGNOSIS — Z825 Family history of asthma and other chronic lower respiratory diseases: Secondary | ICD-10-CM | POA: Diagnosis not present

## 2021-06-07 DIAGNOSIS — G35 Multiple sclerosis: Secondary | ICD-10-CM | POA: Diagnosis not present

## 2021-06-07 DIAGNOSIS — Z886 Allergy status to analgesic agent status: Secondary | ICD-10-CM | POA: Diagnosis not present

## 2021-06-07 DIAGNOSIS — Z880 Allergy status to penicillin: Secondary | ICD-10-CM | POA: Diagnosis not present

## 2021-06-07 DIAGNOSIS — R29818 Other symptoms and signs involving the nervous system: Secondary | ICD-10-CM | POA: Diagnosis not present

## 2021-06-07 DIAGNOSIS — Z8249 Family history of ischemic heart disease and other diseases of the circulatory system: Secondary | ICD-10-CM | POA: Diagnosis not present

## 2021-06-07 LAB — HEPATIC FUNCTION PANEL
ALT: 14 U/L (ref 0–44)
AST: 18 U/L (ref 15–41)
Albumin: 4.4 g/dL (ref 3.5–5.0)
Alkaline Phosphatase: 77 U/L (ref 38–126)
Bilirubin, Direct: 0.1 mg/dL (ref 0.0–0.2)
Indirect Bilirubin: 0.7 mg/dL (ref 0.3–0.9)
Total Bilirubin: 0.8 mg/dL (ref 0.3–1.2)
Total Protein: 7.5 g/dL (ref 6.5–8.1)

## 2021-06-07 LAB — DIFFERENTIAL
Abs Immature Granulocytes: 0.3 10*3/uL — ABNORMAL HIGH (ref 0.00–0.07)
Basophils Absolute: 0.1 10*3/uL (ref 0.0–0.1)
Basophils Relative: 0 %
Eosinophils Absolute: 0 10*3/uL (ref 0.0–0.5)
Eosinophils Relative: 0 %
Immature Granulocytes: 1 %
Lymphocytes Relative: 2 %
Lymphs Abs: 0.8 10*3/uL (ref 0.7–4.0)
Monocytes Absolute: 1.3 10*3/uL — ABNORMAL HIGH (ref 0.1–1.0)
Monocytes Relative: 4 %
Neutro Abs: 32.7 10*3/uL — ABNORMAL HIGH (ref 1.7–7.7)
Neutrophils Relative %: 93 %

## 2021-06-07 LAB — URINALYSIS, ROUTINE W REFLEX MICROSCOPIC
Bilirubin Urine: NEGATIVE
Glucose, UA: 100 mg/dL — AB
Hgb urine dipstick: NEGATIVE
Ketones, ur: NEGATIVE mg/dL
Leukocytes,Ua: NEGATIVE
Nitrite: NEGATIVE
Protein, ur: NEGATIVE mg/dL
Specific Gravity, Urine: >1.03 — ABNORMAL HIGH (ref 1.005–1.030)
pH: 6 (ref 5.0–8.0)

## 2021-06-07 LAB — VITAMIN D 25 HYDROXY (VIT D DEFICIENCY, FRACTURES): Vit D, 25-Hydroxy: 37.43 ng/mL (ref 30–100)

## 2021-06-07 LAB — GLUCOSE, RANDOM: Glucose, Bld: 156 mg/dL — ABNORMAL HIGH (ref 70–99)

## 2021-06-07 MED ORDER — OMEPRAZOLE MAGNESIUM 20 MG PO TBEC: 20.0000 mg | DELAYED_RELEASE_TABLET | Freq: Every day | ORAL | 0 refills | Status: DC

## 2021-06-07 MED ORDER — ENOXAPARIN SODIUM 40 MG/0.4ML IJ SOSY: 40.0000 mg | PREFILLED_SYRINGE | INTRAMUSCULAR | Status: DC

## 2021-06-07 MED ORDER — PREDNISONE 50 MG PO TABS: 1250.0000 mg | ORAL_TABLET | Freq: Every day | ORAL | 0 refills | Status: AC

## 2021-06-07 MED ORDER — SODIUM CHLORIDE 0.9 % IV SOLN
1000.0000 mg | INTRAVENOUS | Status: DC
  Filled 2021-06-07: qty 1000

## 2021-06-07 NOTE — Evaluation (Signed)
Physical Therapy Evaluation Patient Details Name: Jessica Mcintosh MRN: [id number] DOB: [birth year] Today's Date: 06/07/2021   History of Present Illness  Pt is a 30  y/o F admitted on 06/05/21 with c/o 1 week of RLE numbness, paresthesias & slight weakness. Pt with difficulty ambulating 2/2 shooting pains in RLE. Pt being treated for presumed new onset of MS. MRI reveals T5 & brain lesions. PMH: ADD, ovarian cyst, migraines  Clinical Impression  Pt seen for PT evaluation with pt ambulating 2 laps around nurses station independently without AD, negotiating 6 steps with rail with supervision. Pt does not demonstrate any acute PT needs with PT to sign off at this time. Please re-consult if new needs arise.     Follow Up Recommendations No PT follow up    Equipment Recommendations  None recommended by PT    Recommendations for Other Services       Precautions / Restrictions Precautions Precautions: None Restrictions Weight Bearing Restrictions: No      Mobility  Bed Mobility Overal bed mobility: Independent                  Transfers Overall transfer level: Independent Equipment used: None             General transfer comment: sit<>stand without AD  Ambulation/Gait Ambulation/Gait assistance: Independent Gait Distance (Feet): 350 Feet Assistive device: None Gait Pattern/deviations: WFL(Within Functional Limits)        Stairs Stairs: Yes Stairs assistance: Supervision Stair Management: One rail Left (step-to descending, step over step ascending) Number of Stairs: 6    Wheelchair Mobility    Modified Rankin (Stroke Patients Only)       Balance Overall balance assessment: No apparent balance deficits (not formally assessed)                                           Pertinent Vitals/Pain Pain Assessment: Faces Faces Pain Scale: Hurts a little bit Pain Location: ball of R foot during gait/weight bearing Pain Descriptors /  Indicators: Discomfort Pain Intervention(s): Limited activity within patient's tolerance;Monitored during session    Home Living Family/patient expects to be discharged to:: Private residence Living Arrangements: Spouse/significant other;Children (9 y/o and 3 y/o) Available Help at Discharge: Family Type of Home: House (duplex) Home Access: Stairs to enter Entrance Stairs-Rails: None Entrance Stairs-Number of Steps: 1 Home Layout: One level Home Equipment: None      Prior Function Level of Independence: Independent         Comments: driving, caring for 2 young children     Hand Dominance        Extremity/Trunk Assessment   Upper Extremity Assessment Upper Extremity Assessment: Overall WFL for tasks assessed    Lower Extremity Assessment Lower Extremity Assessment: Overall WFL for tasks assessed (BLE sensation intact to light touch, BLE proprioception intact. Pt endorses tingling in BLE but nubmness is much improved.)    Cervical / Trunk Assessment Cervical / Trunk Assessment: Normal  Communication   Communication: No difficulties  Cognition Arousal/Alertness: Awake/alert Behavior During Therapy: WFL for tasks assessed/performed Overall Cognitive Status: Within Functional Limits for tasks assessed                                        General Comments General comments (skin integrity, edema,  etc.): Educated pt on multiple sclerosis & exacerbating factors (heat), encouraged her to f/u with MD appointments    Exercises     Assessment/Plan    PT Assessment Patent does not need any further PT services  PT Problem List         PT Treatment Interventions      PT Goals (Current goals can be found in the Care Plan section)  Acute Rehab PT Goals Patient Stated Goal: go home PT Goal Formulation: With patient Time For Goal Achievement: 06/21/21 Potential to Achieve Goals: Good    Frequency     Barriers to discharge        Co-evaluation                AM-PAC PT "6 Clicks" Mobility  Outcome Measure Help needed turning from your back to your side while in a flat bed without using bedrails?: None Help needed moving from lying on your back to sitting on the side of a flat bed without using bedrails?: None Help needed moving to and from a bed to a chair (including a wheelchair)?: None Help needed standing up from a chair using your arms (e.g., wheelchair or bedside chair)?: None Help needed to walk in hospital room?: None Help needed climbing 3-5 steps with a railing? : A Little 6 Click Score: 23    End of Session   Activity Tolerance: Patient tolerated treatment well Patient left: in bed;with call bell/phone within reach        Time: 1335-1346 PT Time Calculation (min) (ACUTE ONLY): 11 min   Charges:   PT Evaluation $PT Eval Low Complexity: 1 Low          [name], PT, DPT 06/07/21, 2:22 PM   [name] 06/07/2021, 2:21 PM

## 2021-06-07 NOTE — Progress Notes (Signed)
PROGRESS NOTE    Jessica Mcintosh  UXN:[id number] DOB: 1991/04/28 DOA: 06/05/2021 PCP: Berniece Pap, FNP  Outpatient Specialists: none    Brief Narrative:   This is a 30 year old woman with a history of ADD, ovarian cyst, and migraines who presented to the emergency department yesterday reporting 1 week of right lower extremity numbness, paresthesias, and slight weakness.  She has had some difficulty with ambulation due to shooting pains that she feels in her right lower extremity when she does so.  All of this is new for her.  She denies any other focal neurologic deficits and reports no history of neurologic deficits transient or permanent.  Specifically she denies any other focal weakness or numbness, history of vision loss or pain on eye movement, dizziness, difficulty speaking.  She does have a history of migraine headaches which are infrequent and typically resolve with ibuprofen.   Assessment & Plan:   Active Problems:   Acute focal neurological deficit   Right leg weakness   # Presumed new onset multiple sclerosis MRI with t5 and brain lesions. Neuro following. Solumedrol started 8/19 AM. Strength and sensation improving - continue solumedrol IV 1 g q24 with plan for 5 days - outpt neuro f/u - pt/ot consults  # ADHD Home amphetamine on pause   DVT prophylaxis: lovenox Code Status: full Family Communication: none @ bedside  Level of care: Med-Surg Status is: Observation  The patient will require care spanning > 2 midnights and should be moved to inpatient because: IV treatments appropriate due to intensity of illness or inability to take PO  Dispo: The patient is from: Home              Anticipated d/c is to: Home              Patient currently is not medically stable to d/c.   Difficult to place patient No        Consultants:  neurology  Procedures: none  Antimicrobials:  none    Subjective: This morning reports tingling in right leg  improving, sensation improving.  Objective: Vitals:   06/06/21 2108 06/07/21 0100 06/07/21 0427 06/07/21 0716  BP: 119/72 117/74 120/72 (!) 102/55  Pulse: 91 77 85 88  Resp: 16 14 16 16   Temp: 98.5 F (36.9 C) 98.4 F (36.9 C) 98.2 F (36.8 C) 98.2 F (36.8 C)  TempSrc:    Oral  SpO2: 100% 99% 100% 97%  Weight:      Height:        Intake/Output Summary (Last 24 hours) at 06/07/2021 1052 Last data filed at 06/07/2021 0952 Gross per 24 hour  Intake 100 ml  Output 1 ml  Net 99 ml   Filed Weights   06/05/21 1820  Weight: 59.9 kg    Examination:  General exam: Appears calm and comfortable  Respiratory system: Clear to auscultation. Respiratory effort normal. Cardiovascular system: S1 & S2 heard, RRR. No JVD, murmurs, rubs, gallops or clicks. No pedal edema. Gastrointestinal system: Abdomen is nondistended, soft and nontender. No organomegaly or masses felt. Normal bowel sounds heard. Central nervous system: 4/5 strength right lower extremity, sensation in that leg diminished Extremities: Symmetric 5 x 5 power. Skin: No rashes, lesions or ulcers Psychiatry: Judgement and insight appear normal. Mood & affect appropriate.     Data Reviewed: I have personally reviewed following labs and imaging studies  CBC: Recent Labs  Lab 06/06/21 0031  WBC 12.8*  NEUTROABS 8.8*  HGB 14.4  HCT 44.2  MCV 91.7  PLT 304   Basic Metabolic Panel: Recent Labs  Lab 06/06/21 0633 06/07/21 0650  NA 137  --   K 3.4*  --   CL 105  --   CO2 24  --   GLUCOSE 91 156*  BUN 8  --   CREATININE 0.59  --   CALCIUM 9.0  --    GFR: Estimated Creatinine Clearance: 86.2 mL/min (by C-G formula based on SCr of 0.59 mg/dL). Liver Function Tests: No results for input(s): AST, ALT, ALKPHOS, BILITOT, PROT, ALBUMIN in the last 168 hours. No results for input(s): LIPASE, AMYLASE in the last 168 hours. No results for input(s): AMMONIA in the last 168 hours. Coagulation Profile: No results for  input(s): INR, PROTIME in the last 168 hours. Cardiac Enzymes: No results for input(s): CKTOTAL, CKMB, CKMBINDEX, TROPONINI in the last 168 hours. BNP (last 3 results) No results for input(s): PROBNP in the last 8760 hours. HbA1C: No results for input(s): HGBA1C in the last 72 hours. CBG: No results for input(s): GLUCAP in the last 168 hours. Lipid Profile: No results for input(s): CHOL, HDL, LDLCALC, TRIG, CHOLHDL, LDLDIRECT in the last 72 hours. Thyroid Function Tests: No results for input(s): TSH, T4TOTAL, FREET4, T3FREE, THYROIDAB in the last 72 hours. Anemia Panel: No results for input(s): VITAMINB12, FOLATE, FERRITIN, TIBC, IRON, RETICCTPCT in the last 72 hours. Urine analysis:    Component Value Date/Time   COLORURINE YELLOW (A) 06/05/2021 1825   APPEARANCEUR HAZY (A) 06/05/2021 1825   APPEARANCEUR Clear 03/20/2020 1130   LABSPEC 1.005 06/05/2021 1825   PHURINE 6.0 06/05/2021 1825   GLUCOSEU NEGATIVE 06/05/2021 1825   HGBUR NEGATIVE 06/05/2021 1825   BILIRUBINUR NEGATIVE 06/05/2021 1825   BILIRUBINUR moderate (A) 10/11/2020 1125   BILIRUBINUR Negative 03/20/2020 1130   KETONESUR NEGATIVE 06/05/2021 1825   PROTEINUR NEGATIVE 06/05/2021 1825   UROBILINOGEN >=8.0 (A) 10/11/2020 1125   NITRITE NEGATIVE 06/05/2021 1825   LEUKOCYTESUR MODERATE (A) 06/05/2021 1825   Sepsis Labs: @LABRCNTIP (procalcitonin:4,lacticidven:4)  ) Recent Results (from the past 240 hour(s))  Resp Panel by RT-PCR (Flu A&B, Covid) Nasopharyngeal Swab     Status: None   Collection Time: 06/06/21  5:33 AM   Specimen: Nasopharyngeal Swab; Nasopharyngeal(NP) swabs in vial transport medium  Result Value Ref Range Status   SARS Coronavirus 2 by RT PCR NEGATIVE NEGATIVE Final    Comment: (NOTE) SARS-CoV-2 target nucleic acids are NOT DETECTED.  The SARS-CoV-2 RNA is generally detectable in upper respiratory specimens during the acute phase of infection. The lowest concentration of SARS-CoV-2 viral  copies this assay can detect is 138 copies/mL. A negative result does not preclude SARS-Cov-2 infection and should not be used as the sole basis for treatment or other patient management decisions. A negative result may occur with  improper specimen collection/handling, submission of specimen other than nasopharyngeal swab, presence of viral mutation(s) within the areas targeted by this assay, and inadequate number of viral copies(<138 copies/mL). A negative result must be combined with clinical observations, patient history, and epidemiological information. The expected result is Negative.  Fact Sheet for Patients:  06/08/21  Fact Sheet for Healthcare Providers:  BloggerCourse.com  This test is no t yet approved or cleared by the [place] FDA and  has been authorized for detection and/or diagnosis of SARS-CoV-2 by FDA under an Emergency Use Authorization (EUA). This EUA will remain  in effect (meaning this test can be used) for the duration of the COVID-19 declaration under Section 564(b)(1) of  the Act, 21 U.S.C.section 360bbb-3(b)(1), unless the authorization is terminated  or revoked sooner.       Influenza A by PCR NEGATIVE NEGATIVE Final   Influenza B by PCR NEGATIVE NEGATIVE Final    Comment: (NOTE) The Xpert Xpress SARS-CoV-2/FLU/RSV plus assay is intended as an aid in the diagnosis of influenza from Nasopharyngeal swab specimens and should not be used as a sole basis for treatment. Nasal washings and aspirates are unacceptable for Xpert Xpress SARS-CoV-2/FLU/RSV testing.  Fact Sheet for Patients: BloggerCourse.com  Fact Sheet for Healthcare Providers: [employer]ious[employer]ker[employer]it  This test is not yet approved or cleared by the [place] FDA and has been authorized for detection and/or diagnosis of SARS-CoV-2 by FDA under an Emergency Use Authorization (EUA). This  EUA will remain in effect (meaning this test can be used) for the duration of the COVID-19 declaration under Section 564(b)(1) of the Act, 21 U.S.C. section 360bbb-3(b)(1), unless the authorization is terminated or revoked.  Performed at [facility], [address], [place], [place] [address]          Radiology Studies: DG Lumbar Spine 2-3 Views  Result Date: 06/05/2021 CLINICAL DATA:  Right leg numbness and back pain. EXAM: LUMBAR SPINE - 2-3 VIEW COMPARISON:  None. FINDINGS: Five lumbar type vertebra. There is no acute fracture or subluxation of lumbar spine. The vertebral body heights and disc spaces are maintained. The visualized posterior elements are intact. A 3 mm radiopaque focus over the left flank may represent a kidney stone. The soft tissues are unremarkable. IMPRESSION: 1. No acute/traumatic lumbar spine pathology. 2. Possible 3 mm left renal calculus. Electronically Signed   By: Elgie Collard M.D.   On: 06/05/2021 22:43   MR Brain W and Wo Contrast  Result Date: 06/06/2021 CLINICAL DATA:  Multiple sclerosis, new event EXAM: MRI HEAD WITHOUT AND WITH CONTRAST TECHNIQUE: Multiplanar, multiecho pulse sequences of the brain and surrounding structures were obtained without and with intravenous contrast. CONTRAST:  64mL GADAVIST GADOBUTROL 1 MMOL/ML IV SOLN COMPARISON:  None. FINDINGS: Brain: At least 5 FLAIR hyperintensities in the bilateral cerebral white matter which are primarily juxtacortical and ovoid in shape. FLAIR hyperintensity also seen in the ventral left pons. No enhancing disease or restricted diffusion. No black holes or brain atrophy. No infarct, hydrocephalus, or collection Vascular: Normal flow voids.  Normal vessel enhancement Skull and upper cervical spine: Normal marrow signal Sinuses/Orbits: Negative IMPRESSION: Few signal abnormalities in the cerebral white matter and pons which are supportive of demyelinating disease. No enhancing lesions.  Electronically Signed   By: Marnee Spring M.D.   On: 06/06/2021 04:57   MR THORACIC SPINE WO CONTRAST  Result Date: 06/06/2021 CLINICAL DATA:  Left lower extremity weakness EXAM: MRI THORACIC SPINE WITHOUT CONTRAST TECHNIQUE: Multiplanar, multisequence MR imaging of the thoracic spine was performed. No intravenous contrast was administered. COMPARISON:  None. FINDINGS: Alignment:  Physiologic. Vertebrae: No fracture, evidence of discitis, or bone lesion. Cord: There is a focal hyperintense T2-weighted signal lesion the thoracic spinal cord at the T5 level. Craniocaudal length of the lesion is approximately the height of the vertebral body. Lesion predominantly affects the right hemicord. There are no other lesions within the thoracic spinal cord. Paraspinal and other soft tissues: Negative Disc levels: The discs are normal. There is no spinal canal or neural foraminal stenosis. IMPRESSION: Focal hyperintense T2-weighted signal lesion within the thoracic spinal cord at the T5 level, most consistent with demyelinating disease. Further imaging with gadolinium administration may be helpful.  These results were discussed by telephone at the time of interpretation on 06/06/2021 at 2:15 am with Dr. Rochele Raring , who verbally acknowledged these results. Electronically Signed   By: Deatra Robinson M.D.   On: 06/06/2021 02:16   MR THORACIC SPINE W CONTRAST  Result Date: 06/06/2021 CLINICAL DATA:  Multiple sclerosis, new event EXAM: MRI THORACIC SPINE WITH CONTRAST TECHNIQUE: Multiplanar, multisequence MR imaging of the thoracic spine was performed following the administration of intravenous contrast. COMPARISON:  Noncontrast thoracic MRI from earlier the same day FINDINGS: Alignment:  Mild scoliosis. Vertebrae: No fracture, evidence of discitis, or bone lesion. Cord: The area of T2 hyperintensity in the cord at T5 shows a subcentimeter area of nodular enhancement, compatible with active disease. No other areas of cord  enhancement and no cord expansion. Paraspinal and other soft tissues: Negative Disc levels: Better assessed on prior precontrast series. IMPRESSION: The short segment T5 cord lesion is enhancing and compatible with active disease. Electronically Signed   By: Marnee Spring M.D.   On: 06/06/2021 05:03   MR LUMBAR SPINE WO CONTRAST  Result Date: 06/05/2021 CLINICAL DATA:  Low back pain radiating to the right leg EXAM: MRI LUMBAR SPINE WITHOUT CONTRAST TECHNIQUE: Multiplanar, multisequence MR imaging of the lumbar spine was performed. No intravenous contrast was administered. COMPARISON:  None. FINDINGS: Segmentation:  Standard. Alignment:  Physiologic. Vertebrae:  No fracture, evidence of discitis, or bone lesion. Conus medullaris and cauda equina: Conus extends to the L2 level. Conus and cauda equina appear normal. Paraspinal and other soft tissues: Negative. Disc levels: No spinal canal or neural foraminal stenosis. IMPRESSION: Normal lumbar spine MRI. Electronically Signed   By: Deatra Robinson M.D.   On: 06/05/2021 23:54   MR Cervical Spine W or Wo Contrast  Result Date: 06/06/2021 CLINICAL DATA:  Demyelinating disease suspected on cervical MRI EXAM: MRI CERVICAL SPINE WITHOUT AND WITH CONTRAST TECHNIQUE: Multiplanar and multiecho pulse sequences of the cervical spine, to include the craniocervical junction and cervicothoracic junction, were obtained without and with intravenous contrast. CONTRAST:  40mL GADAVIST GADOBUTROL 1 MMOL/ML IV SOLN COMPARISON:  None. FINDINGS: Alignment: Straightening of cervical lordosis Vertebrae: No fracture, evidence of discitis, or bone lesion. Cord: Normal signal and morphology. Posterior Fossa, vertebral arteries, paraspinal tissues: Pontine signal abnormality is described on brain MRI. Disc levels: C5-6 broad central disc protrusion extending to the left foramen where there is also uncovertebral spurring and moderate foraminal stenosis C6-7 right paracentral protrusion  which could affect the intradural C7 nerve root on the right. IMPRESSION: 1. Normal MRI of the cervical cord. 2. Disc protrusions at C5-6 and C6-7. Electronically Signed   By: Marnee Spring M.D.   On: 06/06/2021 05:00        Scheduled Meds: Continuous Infusions:  methylPREDNISolone (SOLU-MEDROL) injection Stopped (06/07/21 0551)     LOS: 1 day    Time spent: 30 min    Silvano Bilis, MD [facility]   If 7PM-7AM, please contact night-coverage [email or web address] Password [facility] 06/07/2021, 10:52 AM

## 2021-06-07 NOTE — Discharge Summary (Signed)
[facility] MMH:[id number] DOB: 08/30/1991 DOA: 06/05/2021  PCP: Berniece Pap, FNP  Admit date: 06/05/2021 Discharge date: 06/07/2021  Time spent: 35 minutes  Recommendations for Outpatient Follow-up:  F/u with [facility] in the next 1-2 weeks     Discharge Diagnoses:  Active Problems:   Acute focal neurological deficit   Right leg weakness   Multiple sclerosis (HCC)   Numbness and tingling of right lower extremity   Discharge Condition: stable  Diet recommendation: regular  Filed Weights   06/05/21 1820  Weight: 59.9 kg    History of present illness:  This is a 30 year old woman with a history of ADD, ovarian cyst, and migraines who presented to the emergency department yesterday reporting 1 week of right lower extremity numbness, paresthesias, and slight weakness.  She has had some difficulty with ambulation due to shooting pains that she feels in her right lower extremity when she does so.  All of this is new for her.  She denies any other focal neurologic deficits and reports no history of neurologic deficits transient or permanent.  Specifically she denies any other focal weakness or numbness, history of vision loss or pain on eye movement, dizziness, difficulty speaking.  She does have a history of migraine headaches which are infrequent and typically resolve with ibuprofen.  Hospital Course:  # Presumed new onset multiple sclerosis MRI with t5 and brain lesions consistent w/ new MS. Neuro following. Solumedrol started 8/19 AM. Symptoms confined to right let - weakness and numbness. Strength and sensation improving. Patient requested discharge home so IV steroids converted to oral to complete 5 day course. Will f/u with [facility].   # Urinary frequency No dysuria. Urinalysis not suggestive of infection - f/u culture  Procedures: none   Consultations: neurology  Discharge Exam: Vitals:   06/07/21 0716 06/07/21 1122   BP: (!) 102/55 115/66  Pulse: 88 89  Resp: 16 16  Temp: 98.2 F (36.8 C) 98.5 F (36.9 C)  SpO2: 97% 97%    General exam: Appears calm and comfortable  Respiratory system: Clear to auscultation. Respiratory effort normal. Cardiovascular system: S1 & S2 heard, RRR. No JVD, murmurs, rubs, gallops or clicks. No pedal edema. Gastrointestinal system: Abdomen is nondistended, soft and nontender. No organomegaly or masses felt. Normal bowel sounds heard. Central nervous system: 4/5 strength right lower extremity, sensation in that leg diminished Extremities: Symmetric 5 x 5 power. Skin: No rashes, lesions or ulcers Psychiatry: Judgement and insight appear normal. Mood & affect appropriate.  Discharge Instructions   Discharge Instructions     Ambulatory referral to Neurology   Complete by: As directed    An appointment is requested in approximately: 2 weeks   Diet general   Complete by: As directed    Increase activity slowly   Complete by: As directed       Allergies as of 06/07/2021       Reactions   Aspirin Hives, Itching, Rash, Shortness Of Breath, Swelling   Penicillins Hives, Rash        Medication List     STOP taking these medications    doxycycline 100 MG capsule Commonly known as: VIBRAMYCIN   ELDERBERRY PO   fluconazole 150 MG tablet Commonly known as: Diflucan   fluconazole 200 MG tablet Commonly known as: DIFLUCAN   ibuprofen 800 MG tablet Commonly known as: ADVIL   metroNIDAZOLE 500 MG tablet Commonly known as: FLAGYL   MULTIVITAMIN ADULTS PO  TAKE these medications    Adderall XR 30 MG 24 hr capsule Generic drug: amphetamine-dextroamphetamine Take 30 mg by mouth every morning.   omeprazole 20 MG tablet Commonly known as: PriLOSEC OTC Take 1 tablet (20 mg total) by mouth daily for 5 days.   predniSONE 50 MG tablet Commonly known as: DELTASONE Take 25 tablets (1,250 mg total) by mouth daily for 3 doses. Start taking on:  June 08, 2021       Allergies  Allergen Reactions   Aspirin Hives, Itching, Rash, Shortness Of Breath and Swelling   Penicillins Hives and Rash    Follow-up Information     [facility] Follow up.   Contact information: [address]     [address] [place] [address] [phone]                 The results of significant diagnostics from this hospitalization (including imaging, microbiology, ancillary and laboratory) are listed below for reference.    Significant Diagnostic Studies: DG Lumbar Spine 2-3 Views  Result Date: 06/05/2021 CLINICAL DATA:  Right leg numbness and back pain. EXAM: LUMBAR SPINE - 2-3 VIEW COMPARISON:  None. FINDINGS: Five lumbar type vertebra. There is no acute fracture or subluxation of lumbar spine. The vertebral body heights and disc spaces are maintained. The visualized posterior elements are intact. A 3 mm radiopaque focus over the left flank may represent a kidney stone. The soft tissues are unremarkable. IMPRESSION: 1. No acute/traumatic lumbar spine pathology. 2. Possible 3 mm left renal calculus. Electronically Signed   By: Elgie Collard M.D.   On: 06/05/2021 22:43   MR Brain W and Wo Contrast  Result Date: 06/06/2021 CLINICAL DATA:  Multiple sclerosis, new event EXAM: MRI HEAD WITHOUT AND WITH CONTRAST TECHNIQUE: Multiplanar, multiecho pulse sequences of the brain and surrounding structures were obtained without and with intravenous contrast. CONTRAST:  98mL GADAVIST GADOBUTROL 1 MMOL/ML IV SOLN COMPARISON:  None. FINDINGS: Brain: At least 5 FLAIR hyperintensities in the bilateral cerebral white matter which are primarily juxtacortical and ovoid in shape. FLAIR hyperintensity also seen in the ventral left pons. No enhancing disease or restricted diffusion. No black holes or brain atrophy. No infarct, hydrocephalus, or collection Vascular: Normal flow voids.  Normal vessel enhancement Skull and upper  cervical spine: Normal marrow signal Sinuses/Orbits: Negative IMPRESSION: Few signal abnormalities in the cerebral white matter and pons which are supportive of demyelinating disease. No enhancing lesions. Electronically Signed   By: Marnee Spring M.D.   On: 06/06/2021 04:57   MR THORACIC SPINE WO CONTRAST  Result Date: 06/06/2021 CLINICAL DATA:  Left lower extremity weakness EXAM: MRI THORACIC SPINE WITHOUT CONTRAST TECHNIQUE: Multiplanar, multisequence MR imaging of the thoracic spine was performed. No intravenous contrast was administered. COMPARISON:  None. FINDINGS: Alignment:  Physiologic. Vertebrae: No fracture, evidence of discitis, or bone lesion. Cord: There is a focal hyperintense T2-weighted signal lesion the thoracic spinal cord at the T5 level. Craniocaudal length of the lesion is approximately the height of the vertebral body. Lesion predominantly affects the right hemicord. There are no other lesions within the thoracic spinal cord. Paraspinal and other soft tissues: Negative Disc levels: The discs are normal. There is no spinal canal or neural foraminal stenosis. IMPRESSION: Focal hyperintense T2-weighted signal lesion within the thoracic spinal cord at the T5 level, most consistent with demyelinating disease. Further imaging with gadolinium administration may be helpful. These results were discussed by telephone at the time of interpretation on 06/06/2021 at  2:15 am with Dr. Rochele Raring , who verbally acknowledged these results. Electronically Signed   By: Deatra Robinson M.D.   On: 06/06/2021 02:16   MR THORACIC SPINE W CONTRAST  Result Date: 06/06/2021 CLINICAL DATA:  Multiple sclerosis, new event EXAM: MRI THORACIC SPINE WITH CONTRAST TECHNIQUE: Multiplanar, multisequence MR imaging of the thoracic spine was performed following the administration of intravenous contrast. COMPARISON:  Noncontrast thoracic MRI from earlier the same day FINDINGS: Alignment:  Mild scoliosis. Vertebrae: No  fracture, evidence of discitis, or bone lesion. Cord: The area of T2 hyperintensity in the cord at T5 shows a subcentimeter area of nodular enhancement, compatible with active disease. No other areas of cord enhancement and no cord expansion. Paraspinal and other soft tissues: Negative Disc levels: Better assessed on prior precontrast series. IMPRESSION: The short segment T5 cord lesion is enhancing and compatible with active disease. Electronically Signed   By: Marnee Spring M.D.   On: 06/06/2021 05:03   MR LUMBAR SPINE WO CONTRAST  Result Date: 06/05/2021 CLINICAL DATA:  Low back pain radiating to the right leg EXAM: MRI LUMBAR SPINE WITHOUT CONTRAST TECHNIQUE: Multiplanar, multisequence MR imaging of the lumbar spine was performed. No intravenous contrast was administered. COMPARISON:  None. FINDINGS: Segmentation:  Standard. Alignment:  Physiologic. Vertebrae:  No fracture, evidence of discitis, or bone lesion. Conus medullaris and cauda equina: Conus extends to the L2 level. Conus and cauda equina appear normal. Paraspinal and other soft tissues: Negative. Disc levels: No spinal canal or neural foraminal stenosis. IMPRESSION: Normal lumbar spine MRI. Electronically Signed   By: Deatra Robinson M.D.   On: 06/05/2021 23:54   MR Cervical Spine W or Wo Contrast  Result Date: 06/06/2021 CLINICAL DATA:  Demyelinating disease suspected on cervical MRI EXAM: MRI CERVICAL SPINE WITHOUT AND WITH CONTRAST TECHNIQUE: Multiplanar and multiecho pulse sequences of the cervical spine, to include the craniocervical junction and cervicothoracic junction, were obtained without and with intravenous contrast. CONTRAST:  32mL GADAVIST GADOBUTROL 1 MMOL/ML IV SOLN COMPARISON:  None. FINDINGS: Alignment: Straightening of cervical lordosis Vertebrae: No fracture, evidence of discitis, or bone lesion. Cord: Normal signal and morphology. Posterior Fossa, vertebral arteries, paraspinal tissues: Pontine signal abnormality is  described on brain MRI. Disc levels: C5-6 broad central disc protrusion extending to the left foramen where there is also uncovertebral spurring and moderate foraminal stenosis C6-7 right paracentral protrusion which could affect the intradural C7 nerve root on the right. IMPRESSION: 1. Normal MRI of the cervical cord. 2. Disc protrusions at C5-6 and C6-7. Electronically Signed   By: Marnee Spring M.D.   On: 06/06/2021 05:00    Microbiology: Recent Results (from the past 240 hour(s))  Resp Panel by RT-PCR (Flu A&B, Covid) Nasopharyngeal Swab     Status: None   Collection Time: 06/06/21  5:33 AM   Specimen: Nasopharyngeal Swab; Nasopharyngeal(NP) swabs in vial transport medium  Result Value Ref Range Status   SARS Coronavirus 2 by RT PCR NEGATIVE NEGATIVE Final    Comment: (NOTE) SARS-CoV-2 target nucleic acids are NOT DETECTED.  The SARS-CoV-2 RNA is generally detectable in upper respiratory specimens during the acute phase of infection. The lowest concentration of SARS-CoV-2 viral copies this assay can detect is 138 copies/mL. A negative result does not preclude SARS-Cov-2 infection and should not be used as the sole basis for treatment or other patient management decisions. A negative result may occur with  improper specimen collection/handling, submission of specimen other than nasopharyngeal swab, presence of viral mutation(s)  within the areas targeted by this assay, and inadequate number of viral copies(<138 copies/mL). A negative result must be combined with clinical observations, patient history, and epidemiological information. The expected result is Negative.  Fact Sheet for Patients:  BloggerCourse.com  Fact Sheet for Healthcare Providers:  SeriousBroker.it  This test is no t yet approved or cleared by the [place] FDA and  has been authorized for detection and/or diagnosis of SARS-CoV-2 by FDA under an Emergency Use  Authorization (EUA). This EUA will remain  in effect (meaning this test can be used) for the duration of the COVID-19 declaration under Section 564(b)(1) of the Act, 21 U.S.C.section 360bbb-3(b)(1), unless the authorization is terminated  or revoked sooner.       Influenza A by PCR NEGATIVE NEGATIVE Final   Influenza B by PCR NEGATIVE NEGATIVE Final    Comment: (NOTE) The Xpert Xpress SARS-CoV-2/FLU/RSV plus assay is intended as an aid in the diagnosis of influenza from Nasopharyngeal swab specimens and should not be used as a sole basis for treatment. Nasal washings and aspirates are unacceptable for Xpert Xpress SARS-CoV-2/FLU/RSV testing.  Fact Sheet for Patients: BloggerCourse.com  Fact Sheet for Healthcare Providers: SeriousBroker.it  This test is not yet approved or cleared by the [place] FDA and has been authorized for detection and/or diagnosis of SARS-CoV-2 by FDA under an Emergency Use Authorization (EUA). This EUA will remain in effect (meaning this test can be used) for the duration of the COVID-19 declaration under Section 564(b)(1) of the Act, 21 U.S.C. section 360bbb-3(b)(1), unless the authorization is terminated or revoked.  Performed at [facility], [address] [place], [place] [address]      Labs: Basic Metabolic Panel: Recent Labs  Lab 06/06/21 [id number] 06/07/21 0650  NA 137  --   K 3.4*  --   CL 105  --   CO2 24  --   GLUCOSE 91 156*  BUN 8  --   CREATININE 0.59  --   CALCIUM 9.0  --    Liver Function Tests: Recent Labs  Lab 06/07/21 1301  AST 18  ALT 14  ALKPHOS 77  BILITOT 0.8  PROT 7.5  ALBUMIN 4.4   No results for input(s): LIPASE, AMYLASE in the last 168 hours. No results for input(s): AMMONIA in the last 168 hours. CBC: Recent Labs  Lab 06/06/21 0031 06/07/21 1301  WBC 12.8*  --   NEUTROABS 8.8* 32.7*  HGB 14.4  --   HCT 44.2  --   MCV 91.7  --   PLT  304  --    Cardiac Enzymes: No results for input(s): CKTOTAL, CKMB, CKMBINDEX, TROPONINI in the last 168 hours. BNP: BNP (last 3 results) No results for input(s): BNP in the last 8760 hours.  ProBNP (last 3 results) No results for input(s): PROBNP in the last 8760 hours.  CBG: No results for input(s): GLUCAP in the last 168 hours.     Signed:  Silvano Bilis MD.  [facility] 06/07/2021, 2:54 PM

## 2021-06-07 NOTE — Evaluation (Signed)
Occupational Therapy Evaluation Patient Details Name: Jessica Mcintosh MRN: [id number] DOB: [birth year] Today's Date: 06/07/2021    History of Present Illness Pt is a 30  y/o F admitted on 06/05/21 with c/o 1 week of RLE numbness, paresthesias & slight weakness. Pt with difficulty ambulating 2/2 shooting pains in RLE. Pt being treated for presumed new onset of MS. MRI reveals T5 & brain lesions. PMH: ADD, ovarian cyst, migraines   Clinical Impression   Pt. was packing upon arrival, and preparing for discharging home. BUE ROM, strength, motor control, [facility] skills, and sensation are all WFL. Pt. is independent with ADLs.  OT skilled services are not warranted at this time. No additional follow-up services are indicated at this time. Will complete the order, as pt. Is preparing for d/c.    Follow Up Recommendations  No OT follow up    Equipment Recommendations  None recommended by OT    Recommendations for Other Services       Precautions / Restrictions Precautions Precautions: None Restrictions Weight Bearing Restrictions: No      Mobility Bed Mobility Overal bed mobility: Independent                  Transfers Overall transfer level: Independent Equipment used: None             General transfer comment: sit<>stand without AD    Balance Overall balance assessment: No apparent balance deficits (not formally assessed)                                         ADL either performed or assessed with clinical judgement   ADL Overall ADL's : Needs assistance/impaired Eating/Feeding: Independent   Grooming: Independent   Upper Body Bathing: Independent   Lower Body Bathing: Independent   Upper Body Dressing : Independent   Lower Body Dressing: Independent   Toilet Transfer: Independent           Functional mobility during ADLs: Independent       Vision Baseline Vision/History: No visual deficits Patient Visual Report: No change from  baseline       Perception     Praxis      Pertinent Vitals/Pain Pain Assessment: No/denies pain Faces Pain Scale: Hurts a little bit Pain Location: ball of R foot during gait/weight bearing Pain Descriptors / Indicators: Discomfort Pain Intervention(s): Limited activity within patient's tolerance;Monitored during session     Hand Dominance Right   Extremity/Trunk Assessment Upper Extremity Assessment Upper Extremity Assessment: Overall WFL for tasks assessed      Cervical / Trunk Assessment Cervical / Trunk Assessment: Normal   Communication Communication Communication: No difficulties   Cognition Arousal/Alertness: Awake/alert Behavior During Therapy: WFL for tasks assessed/performed Overall Cognitive Status: Within Functional Limits for tasks assessed                                     General Comments  Educated pt on multiple sclerosis & exacerbating factors (heat), encouraged her to f/u with MD appointments    Exercises     Shoulder Instructions      Home Living Family/patient expects to be discharged to:: Private residence Living Arrangements: Spouse/significant other;Children Available Help at Discharge: Family Type of Home: House (Duplex) Home Access: Stairs to enter Secretary/administrator of Steps: 1 Entrance Stairs-Rails: None Home  Layout: One level               Home Equipment: None          Prior Functioning/Environment Level of Independence: Independent        Comments: driving, caring for 2 young children        OT Problem List:        OT Treatment/Interventions:      OT Goals(Current goals can be found in the care plan section) Acute Rehab OT Goals Patient Stated Goal: To return home OT Goal Formulation: With patient Time For Goal Achievement: 06/14/21 Potential to Achieve Goals: Good  OT Frequency:     Barriers to D/C:            Co-evaluation              AM-PAC OT "6 Clicks" Daily  Activity     Outcome Measure Help from another person eating meals?: None Help from another person taking care of personal grooming?: None Help from another person toileting, which includes using toliet, bedpan, or urinal?: None Help from another person bathing (including washing, rinsing, drying)?: None Help from another person to put on and taking off regular upper body clothing?: None Help from another person to put on and taking off regular lower body clothing?: None 6 Click Score: 24   End of Session    Activity Tolerance: Patient tolerated treatment well Patient left: with nursing/sitter in room;with call bell/phone within reach  OT Visit Diagnosis: Unsteadiness on feet (R26.81);Muscle weakness (generalized) (M62.81)                Time: 4599-7741 OT Time Calculation (min): 15 min Charges:  OT General Charges $OT Visit: 1 Visit OT Evaluation $OT Eval Low Complexity: 1 Low  Olegario Messier, MS, OTR/L   Olegario Messier 06/07/2021, 3:29 PM

## 2021-06-07 NOTE — Progress Notes (Signed)
Neurology Progress Note  S: Patient slightly improved RLE paresthesias after receiving 2 doses of IV solumedrol. She is v. anxious 2/2 being separated from her 2 small children for the hospitalization and wants to go home if at all possible.  O:  Vitals:   06/07/21 0716 06/07/21 1122  BP: (!) 102/55 115/66  Pulse: 88 89  Resp: 16 16  Temp: 98.2 F (36.8 C) 98.5 F (36.9 C)  SpO2: 97% 97%   Physical Exam Gen: A&O x4, NAD HEENT: Atraumatic, normocephalic;mucous membranes moist; oropharynx clear, tongue without atrophy or fasciculations. Neck: Supple, trachea midline. Resp: CTAB, no w/r/r CV: RRR, no m/g/r; nml S1 and S2. 2+ symmetric peripheral pulses. Abd: soft/NT/ND; nabs x 4 quad Extrem: Nml bulk; no cyanosis, clubbing, or edema.   Neuro: *MS: A&O x4. Follows multi-step commands.  *Speech: fluid, nondysarthric, able to name and repeat *CN:    I: Deferred   II,III: PERRLA, VFF by confrontation, optic discs sharp   III,IV,VI: EOMI w/o nystagmus, no ptosis   V: Sensation intact from V1 to V3 to LT   VII: Eyelid closure was full.  Smile symmetric.   VIII: Hearing intact to voice   IX,X: Voice normal, palate elevates symmetrically    XI: SCM/trap 5/5 bilat   XII: Tongue protrudes midline, no atrophy or fasciculations    *Motor:   Normal bulk.  No tremor, rigidity or bradykinesia. No pronator drift.     Strength: Dlt Bic Tri WrE WrF FgS Gr HF KnF KnE PlF DoF    Left 5 5 5 5 5 5 5 5 5 5 5 5     Right 5 5 5 5 5 5 5 5 5 5 5 5       *Sensory: Hyperesthesia and sensory deficit to PP in RLE, otherwise intact to all modalities *Coordination:  Finger-to-nose, heel-to-shin, rapid alternating motions were intact. *Reflexes:  2+ brisk BUE, 3+ symmetric bilateral patellae, nonsustained clonus bilateral achilles, toes mute bilat *Gait: deferred   A/P: This is a 30 year old woman with a history of ADD, ovarian cyst, and migraines who presented to the emergency department yesterday  reporting 1 week of right lower extremity numbness, paresthesias, and slight weakness.  Imaging significant for multiple small nonenhancing lesions in the brain concerning for demyelinating disease and enhancing lesion at T5 concerning for active demyelination.  This meets clinical criteria for for diagnosis of MS with lesions separated in space and time.  She has received 2 doses of IV solumedrol here inpatient but is very tearful and anxious about being separated from her 2 young children while hospitalized. This is understandable. I discussed with her the pros and cons of outpatient treatment and she elected to complete a short course of high-dose po steroids as outpatient with close f/u in neurology clinic.  Recommendations: - Rx sent for 1250mg  po prednisone (25 tabs of 50mg ) daily x3 days to be started tomorrow 8/21 - Rx sent for omeprazole 20mg  po daily x5 days - Additional labs to be drawn today so that outpatient neurologist has results: CBC w/ diff, LFTs, vit D, JC virus. Discharge does not need to wait for results. - Ambulatory referral made to [facility] for appt within 2 weeks; they will call her for appt - Pt should be instructed to return to ED or urgent care for any concerns that arise between now and her appt with neurology - Pt c/o urinary frequency, will reorder UA to be performed before she leaves so if results are c/f UTI  she can also be sent out with abx  Will sign off. Dr. Ashok Pall will contact me with any questions.  Bing Neighbors, MD [facility] [phone]  If 7pm- 7am, please page neurology on call as listed in AMION.

## 2021-06-11 LAB — URINE CULTURE: Culture: >=100000 — AB

## 2021-06-14 LAB — JC VIRUS DNA,PCR (WHOLE BLOOD)

## 2021-06-17 ENCOUNTER — Encounter: Payer: Self-pay | Admitting: Neurology

## 2021-06-17 ENCOUNTER — Ambulatory Visit: Payer: Medicaid Other | Admitting: Neurology

## 2021-06-17 ENCOUNTER — Telehealth: Payer: Self-pay | Admitting: Neurology

## 2021-06-17 VITALS — BP 129/89 | HR 109 | Ht 61.0 in | Wt 131.0 lb

## 2021-06-17 DIAGNOSIS — R200 Anesthesia of skin: Secondary | ICD-10-CM

## 2021-06-17 DIAGNOSIS — R29898 Other symptoms and signs involving the musculoskeletal system: Secondary | ICD-10-CM | POA: Diagnosis not present

## 2021-06-17 DIAGNOSIS — G35 Multiple sclerosis: Secondary | ICD-10-CM

## 2021-06-17 DIAGNOSIS — Z79899 Other long term (current) drug therapy: Secondary | ICD-10-CM

## 2021-06-17 DIAGNOSIS — R202 Paresthesia of skin: Secondary | ICD-10-CM

## 2021-06-17 DIAGNOSIS — F988 Other specified behavioral and emotional disorders with onset usually occurring in childhood and adolescence: Secondary | ICD-10-CM

## 2021-06-17 HISTORY — DX: Other long term (current) drug therapy: Z79.899

## 2021-06-17 NOTE — Progress Notes (Signed)
[facility]  PATIENT: Jessica Mcintosh DOB: 1990/10/25  REFERRING DOCTOR OR PCP: Marvell Fuller, FNP SOURCE: Patient, notes from recent admission, imaging and lab reports, MRI images personally reviewed.  _________________________________   HISTORICAL  CHIEF COMPLAINT:  Chief Complaint  Patient presents with   New Patient (Initial Visit)    RM 2, alone. Patient presented to ER 8/18 with numbness and tingling in LE. Steroids improved symptoms but not completely resolved. Still has some numbness and tingling in feet. Having headaches since MRIs.    HISTORY OF PRESENT ILLNESS:  I had the pleasure of seeing your patient, Jessica Mcintosh, at the [facility] at [facility] for neurologic consultation  regarding her recent diagnosis of multiple sclerosis.  She is a 30 year woman who had the onset of right foot numbness and tingling 06/02/2021, as well the leg was going to sleep.  Over the next couple hours, she began to have more numbness up the entire leg.    Symptoms persisted and she noted very mild weakness as well.   She had slight reduced balance as well.  Bladder function was fine.   When she realized some numbness extended above the waist, she presented to the Urgent Care and was sent to the ED.   In the ED, she had MRI's showing a few nonspecific brain lesions and one focus adjacent to T5 consistent with MS.   She was admitted to the hospital and received 5 days of IV Solu-Medrol.   By the 5th dose, symptoms started to improve and she is better still currently though not 100% back to baseline.     Currently, gait, balance and strengh are fine.   She has mild numbness in ht right foot and toes only.    Bladder function is fine.   Vision is fine.      She is otherwise fairly healthy.   She has ADD.   I showed her the actual MRI images.   IMAGING: MRI of the brain with and without contrast 06/06/2021 shows several T2/FLAIR hyperintense foci, 2 in  the juxtacortical white matter and the rest in the deep or subcortical white matter.  1 focus is noted in the anterior pons.  MRI of the cervical and thoracic spine 06/06/2021 shows a T2 hyperintense focus in the spinal cord posteriorly to the right adjacent to T5.  The focus enhanced after contrast was administered.  The cervical spinal cord is normal.  Mild disc bulging at C5-C6.  No nerve root compression or spinal stenosis.  Laboratory tests: August 2022: Vitamin D was normal (37); HIV negative; urine culture showed staph epidermidis; elevated neutrophils on CBC, pregnancy test was normal  She has no FH of MS  REVIEW OF SYSTEMS: Constitutional: No fevers, chills, sweats, or change in appetite Eyes: No visual changes, double vision, eye pain Ear, nose and throat: No hearing loss, ear pain, nasal congestion, sore throat Cardiovascular: No chest pain, palpitations Respiratory:  No shortness of breath at rest or with exertion.   No wheezes GastrointestinaI: No nausea, vomiting, diarrhea, abdominal pain, fecal incontinence Genitourinary:  No dysuria, urinary retention or frequency.  No nocturia. Musculoskeletal:  No neck pain, back pain Integumentary: No rash, pruritus, skin lesions Neurological: as above Psychiatric: No depression at this time.  No anxiety Endocrine: No palpitations, diaphoresis, change in appetite, change in weigh or increased thirst Hematologic/Lymphatic:  No anemia, purpura, petechiae. Allergic/Immunologic: No itchy/runny eyes, nasal congestion, recent allergic reactions, rashes  ALLERGIES: Allergies  Allergen  Reactions   Aspirin Hives, Itching, Rash, Shortness Of Breath and Swelling   Penicillins Hives and Rash    HOME MEDICATIONS:  Current Outpatient Medications:    ADDERALL XR 30 MG 24 hr capsule, Take 30 mg by mouth every morning., Disp: , Rfl:    amphetamine-dextroamphetamine (ADDERALL) 20 MG tablet, Take 20 mg by mouth every evening., Disp: , Rfl:     ibuprofen (ADVIL) 800 MG tablet, Take 800 mg by mouth., Disp: , Rfl:   PAST MEDICAL HISTORY: Past Medical History:  Diagnosis Date   ADD (attention deficit disorder) 05/28/2020   Allergy    Ovarian cyst    Preeclampsia 2018    PAST SURGICAL HISTORY: Past Surgical History:  Procedure Laterality Date   NO PAST SURGERIES      FAMILY HISTORY: Family History  Problem Relation Age of Onset   Depression Mother    Hypertension Mother    COPD Mother    Depression Maternal Aunt    Hypertension Maternal Aunt    Cancer Paternal Uncle    Lung cancer Paternal Uncle    Breast cancer Maternal Grandmother    Depression Maternal Grandmother    Cancer Maternal Grandfather    Lung cancer Maternal Grandfather    Cancer Paternal Grandfather    Skin cancer Paternal Grandfather    Alcoholism Father    Hypertension Father    Ovarian cancer Neg Hx    Colon cancer Neg Hx     SOCIAL HISTORY:  Social History   Socioeconomic History   Marital status: Married    Spouse name: Not on file   Number of children: Not on file   Years of education: Not on file   Highest education level: Not on file  Occupational History   Not on file  Tobacco Use   Smoking status: Never   Smokeless tobacco: Never  Vaping Use   Vaping Use: Never used  Substance and Sexual Activity   Alcohol use: No   Drug use: No   Sexual activity: Yes    Birth control/protection: None  Other Topics Concern   Not on file  Social History Narrative   Not on file   Social Determinants of Health   Financial Resource Strain: Not on file  Food Insecurity: Not on file  Transportation Needs: Not on file  Physical Activity: Not on file  Stress: Not on file  Social Connections: Not on file  Intimate Partner Violence: Not on file     PHYSICAL EXAM  Vitals:   06/17/21 1324  BP: 129/89  Pulse: (!) 109  Weight: 131 lb (59.4 kg)  Height: 5\' 1"  (1.549 m)    Body mass index is 24.75 kg/m.   General: The patient is  well-developed and well-nourished and in no acute distress  HEENT:  Head is R[place]t/AT.  Sclera are anicteric.  Funduscopic exam shows normal optic discs and retinal vessels.  Neck: No carotid bruits are noted.  The neck is nontender.  Cardiovascular: The heart has a regular rate and rhythm with a normal S1 and S2. There were no murmurs, gallops or rubs.    Skin: Extremities are without rash or  edema.  Musculoskeletal:  Back is nontender  Neurologic Exam  Mental status: The patient is alert and oriented x 3 at the time of the examination. The patient has apparent normal recent and remote memory, with an apparently normal attention span and concentration ability.   Speech is normal.  Cranial nerves: Extraocular movements are full. Pupils  are equal, round, and reactive to light and accomodation.  Visual fields are full.  Facial symmetry is present. There is good facial sensation to soft touch bilaterally.Facial strength is normal.  Trapezius and sternocleidomastoid strength is normal. No dysarthria is noted.  The tongue is midline, and the patient has symmetric elevation of the soft palate. No obvious hearing deficits are noted.  Motor:  Muscle bulk is normal.   Tone is normal. Strength is  5 / 5 in all 4 extremities.   Sensory: Sensory testing is intact to pinprick, soft touch and vibration sensation in all 4 extremities.  Coordination: Cerebellar testing reveals good finger-nose-finger and heel-to-shin bilaterally.  Gait and station: Station is normal.   Gait is normal. Tandem gait is normal. Romberg is negative.   Reflexes: Deep tendon reflexes are symmetric and normal in arms but increased in legs, right greater than left no clonus.   Plantar responses are flexor.    DIAGNOSTIC DATA (LABS, IMAGING, TESTING) - I reviewed patient records, labs, notes, testing and imaging myself where available.  Lab Results  Component Value Date   WBC 12.8 (H) 06/06/2021   HGB 14.4 06/06/2021   HCT  44.2 06/06/2021   MCV 91.7 06/06/2021   PLT 304 06/06/2021      Component Value Date/Time   NA 137 06/06/2021 0633   NA 140 03/20/2020 1130   K 3.4 (L) 06/06/2021 0633   CL 105 06/06/2021 0633   CO2 24 06/06/2021 0633   GLUCOSE 156 (H) 06/07/2021 0650   BUN 8 06/06/2021 0633   BUN 9 03/20/2020 1130   CREATININE 0.59 06/06/2021 0633   CALCIUM 9.0 06/06/2021 0633   PROT 7.5 06/07/2021 1301   PROT 7.0 03/20/2020 1130   ALBUMIN 4.4 06/07/2021 1301   ALBUMIN 4.4 03/20/2020 1130   AST 18 06/07/2021 1301   ALT 14 06/07/2021 1301   ALKPHOS 77 06/07/2021 1301   BILITOT 0.8 06/07/2021 1301   BILITOT 0.2 03/20/2020 1130   GFRNONAA >60 06/06/2021 0633   GFRAA 143 03/20/2020 1130   Lab Results  Component Value Date   CHOL 149 03/20/2020   HDL 51 03/20/2020   LDLCALC 81 03/20/2020   TRIG 91 03/20/2020   Lab Results  Component Value Date   HGBA1C 4.9 05/27/2018   No results found for: VITAMINB12 Lab Results  Component Value Date   TSH 1.120 03/20/2020       ASSESSMENT AND PLAN  Multiple sclerosis (HCC) - Plan: Varicella zoster antibody, IgG, Stratify JCV Antibody Test ([facility]), CYP2C9 Genotyping Siponimod  High risk medication use - Plan: Varicella zoster antibody, IgG, Stratify JCV Antibody Test ([facility]), CYP2C9 Genotyping Siponimod  Right leg weakness  Numbness and tingling of right lower extremity  Attention deficit disorder (ADD) without hyperactivity   In summary, Ms. Elfrink is a 31 year old woman who presented a few weeks ago with right leg numbness and weakness and was found on MRI imaging to have an enhancing focus posteriorly to the right adjacent to T5 within the spinal cord and several other nonenhancing lesions in the brain.  The compilation of symptoms and imaging is consistent with multiple sclerosis.  We reviewed some treatment options.  She is considering having another pregnancy in the next year or 2.  She is concerned about the safety of the  medications.  I recommend that she consider Tecfidera as safety and efficacy or generally very favorable, especially at her age.  We discussed that if she did have another exacerbation or significant  changes when we next checked her MRI that I would want her to step up the efficacy to one of the infusions or one of the S1 P receptor modulators.  We will go ahead and check a JCV antibody to better now if Tysabri would be an option in the future if she has breakthrough activity.  She is advised to stay active and exercise as tolerated.  She will return to see me in 3 months or sooner for new or worsening neurologic symptoms.  Thank you for asking me to see Ms. Proffitt.  Please let me know if I can be of further assistance with her or other patients in the future.   Shamon Cothran A. Epimenio Foot, MD, [facility] 06/17/2021, 7:49 PM Certified in Neurology, Clinical Neurophysiology, Sleep Medicine and Neuroimaging  [facility] [address], [address] [place], [place] [address] [phone]

## 2021-06-17 NOTE — Telephone Encounter (Signed)
Placed JCV lab in [facility] lock box for routine lab pick up. Results pending. 

## 2021-06-24 NOTE — Telephone Encounter (Signed)
JCV drawn on 06/17/21 negative, index: 0.12. Gave to MD for review.

## 2021-06-25 LAB — CYP2C9 GENOTYPING SIPONIMOD

## 2021-06-25 LAB — VARICELLA ZOSTER ANTIBODY, IGG: Varicella zoster IgG: 1554 index (ref >165)

## 2021-07-01 ENCOUNTER — Telehealth: Payer: Self-pay

## 2021-07-01 NOTE — Telephone Encounter (Signed)
I called patient to discuss her lab work and the [employer] start form.  No answer, no voicemail set up.  We will try again later.

## 2021-07-01 NOTE — Telephone Encounter (Signed)
-----   Message from Asa Lente, MD sent at 06/27/2021  9:56 AM EDT ----- I called to go over the lab results.  Her mailbox was full so I could not leave a voicemail.  At her visit we had discussed starting Tecfidera.  Her lab work was fine.  She has Medicaid.

## 2021-07-07 NOTE — Telephone Encounter (Signed)
I called patient again to discuss.  No answer, no voicemail set up.  I will try again later.

## 2021-07-08 NOTE — Telephone Encounter (Signed)
I called patient again to discuss. No answer, no voicemail set up.  This is my third unsuccessful attempt at reaching pt via phone. I have already sent a mychart message. I will send a letter asking her to call me back.

## 2021-09-02 ENCOUNTER — Encounter: Payer: Self-pay | Admitting: Certified Nurse Midwife

## 2021-09-02 ENCOUNTER — Encounter: Payer: Medicaid Other | Admitting: Certified Nurse Midwife

## 2021-09-15 ENCOUNTER — Ambulatory Visit: Payer: Medicaid Other | Admitting: Neurology

## 2021-09-15 ENCOUNTER — Encounter: Payer: Self-pay | Admitting: Neurology

## 2021-11-10 ENCOUNTER — Encounter: Payer: Self-pay | Admitting: Certified Nurse Midwife

## 2021-11-10 ENCOUNTER — Other Ambulatory Visit (HOSPITAL_COMMUNITY)
Admission: RE | Admit: 2021-11-10 | Discharge: 2021-11-10 | Disposition: A | Discharge status: Home / Self Care | Payer: Medicaid Other | Source: Ambulatory Visit | Attending: Certified Nurse Midwife | Admitting: Certified Nurse Midwife

## 2021-11-10 ENCOUNTER — Ambulatory Visit (INDEPENDENT_AMBULATORY_CARE_PROVIDER_SITE_OTHER): Payer: Medicaid Other | Admitting: Certified Nurse Midwife

## 2021-11-10 ENCOUNTER — Other Ambulatory Visit: Payer: Self-pay

## 2021-11-10 VITALS — BP 126/78 | HR 87 | Ht 61.0 in | Wt 142.5 lb

## 2021-11-10 DIAGNOSIS — Z01419 Encounter for gynecological examination (general) (routine) without abnormal findings: Secondary | ICD-10-CM | POA: Diagnosis present

## 2021-11-10 DIAGNOSIS — B851 Pediculosis due to Pediculus humanus corporis: Secondary | ICD-10-CM | POA: Insufficient documentation

## 2021-11-10 DIAGNOSIS — Z113 Encounter for screening for infections with a predominantly sexual mode of transmission: Secondary | ICD-10-CM

## 2021-11-10 DIAGNOSIS — N898 Other specified noninflammatory disorders of vagina: Secondary | ICD-10-CM

## 2021-11-10 NOTE — Progress Notes (Signed)
GYNECOLOGY ANNUAL PREVENTATIVE CARE ENCOUNTER NOTE  History:     Jessica Mcintosh is a 31 y.o. [id number] female here for a routine annual gynecologic exam.  Current complaints: vaginal discharge, state she had a piece of retained tampon that was removed by pcp.   Denies abnormal vaginal bleeding, discharge, pelvic pain, problems with intercourse or other gynecologic concerns.     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   Gynecologic History Patient's last menstrual period was 09/19/2021 (approximate). Contraceptio/n:  pull out method Last Pap: 05/2020. Results were: ascus with negative HPV Last mammogram: n/a .   Obstetric History OB History  Gravida Para Term Preterm AB Living  1 1 1     1   SAB IAB Ectopic Multiple Live Births        0 1    # Outcome Date GA Lbr Len/2nd Weight Sex Delivery Anes PTL Lv  1 Term 10/08/17 [redacted]w[redacted]d 03:50 / 00:42 7 lb 4.8 oz (3.31 kg) M Vag-Spont EPI  LIV     Complications: Preeclampsia    Past Medical History:  Diagnosis Date   ADD (attention deficit disorder) 05/28/2020   Allergy    Ovarian cyst    Preeclampsia 2018    Past Surgical History:  Procedure Laterality Date   NO PAST SURGERIES      Current Outpatient Medications on File Prior to Visit  Medication Sig Dispense Refill   ADDERALL XR 30 MG 24 hr capsule Take 30 mg by mouth every morning.     amphetamine-dextroamphetamine (ADDERALL) 20 MG tablet Take 20 mg by mouth every evening.     ibuprofen (ADVIL) 800 MG tablet Take 800 mg by mouth.     Multiple Vitamins-Minerals (WOMENS MULTIVITAMIN PO) Take by mouth.     No current facility-administered medications on file prior to visit.    Allergies  Allergen Reactions   Aspirin Hives, Itching, Rash, Shortness Of Breath and Swelling   Penicillins Hives and Rash    Social History:   reports that she has never smoked. She has never used smokeless tobacco. She reports that she does not drink alcohol and does not use drugs.  Family History  Problem Relation Age of Onset   Depression Mother    Hypertension Mother    COPD Mother    Depression Maternal Aunt    Hypertension Maternal Aunt    Cancer Paternal Uncle    Lung cancer Paternal Uncle    Breast cancer Maternal Grandmother    Depression Maternal Grandmother    Cancer Maternal Grandfather    Lung cancer Maternal Grandfather    Cancer Paternal Grandfather    Skin cancer Paternal Grandfather    Alcoholism Father    Hypertension Father    Ovarian cancer Neg Hx    Colon cancer Neg Hx     The following portions of the patient's history were reviewed and updated as appropriate: allergies, current medications, past family history, past medical history, past social history, past surgical history and problem list.  Review of Systems Pertinent items noted in HPI and remainder of comprehensive ROS otherwise negative.  Physical Exam:  BP 126/78    Pulse 87    Ht 5\' 1"  (1.549 m)    Wt 142 lb 8 oz (64.6 kg)    LMP 09/19/2021 (Approximate)    BMI 26.93 kg/m  CONSTITUTIONAL: Well-developed,  well-nourished female in no acute distress.  HENT:  Normocephalic, atraumatic, External right and left ear normal. Oropharynx is clear and moist EYES: Conjunctivae and EOM are normal. Pupils are equal, round, and reactive to light. No scleral icterus.  NECK: Normal range of motion, supple, no masses.  Normal thyroid.  SKIN: Skin is warm and dry. No rash noted. Not diaphoretic. No erythema. No pallor. MUSCULOSKELETAL: Normal range of motion. No tenderness.  No cyanosis, clubbing, or edema.  2+ distal pulses. NEUROLOGIC: Alert and oriented to person, place, and time. Normal reflexes, muscle tone coordination.  PSYCHIATRIC: Normal mood and affect. Normal behavior. Normal judgment and thought content. CARDIOVASCULAR: Normal heart rate noted,  regular rhythm RESPIRATORY: Clear to auscultation bilaterally. Effort and breath sounds normal, no problems with respiration noted. BREASTS: Symmetric in size. No masses, tenderness, skin changes, nipple drainage, or lymphadenopathy bilaterally.  Dense tissue left breat upper inner quadrant  ABDOMEN: Soft, no distention noted.  No tenderness, rebound or guarding.  PELVIC: Normal appearing external genitalia and urethral meatus; normal appearing vaginal mucosa and cervix. White particulate discharge with odor noted.  Pap smear not due. Swab collected Normal uterine size, no other palpable masses, no uterine or adnexal tenderness.  .   Assessment and Plan:    1. Women's annual routine gynecological examination  Pap: not due 2024 Mammogram : n/a  Labs:  hep c, vaginal swab Refills: none Referral: none  Routine preventative health maintenance measures emphasized. Please refer to After Visit Summary for other counseling recommendations.      Doreene Burke, CNM [facility],  [facility]

## 2021-11-10 NOTE — Patient Instructions (Signed)
Preventive Care 21-30 Years Old, Female °Preventive care refers to lifestyle choices and visits with your health care provider that can promote health and wellness. Preventive care visits are also called wellness exams. °What can I expect for my preventive care visit? °Counseling °During your preventive care visit, your health care provider may ask about your: °Medical history, including: °Past medical problems. °Family medical history. °Pregnancy history. °Current health, including: °Menstrual cycle. °Method of birth control. °Emotional well-being. °Home life and relationship well-being. °Sexual activity and sexual health. °Lifestyle, including: °Alcohol, nicotine or tobacco, and drug use. °Access to firearms. °Diet, exercise, and sleep habits. °Work and work environment. °Sunscreen use. °Safety issues such as seatbelt and bike helmet use. °Physical exam °Your health care provider may check your: °Height and weight. These may be used to calculate your BMI (body mass index). BMI is a measurement that tells if you are at a healthy weight. °Waist circumference. This measures the distance around your waistline. This measurement also tells if you are at a healthy weight and may help predict your risk of certain diseases, such as type 2 diabetes and high blood pressure. °Heart rate and blood pressure. °Body temperature. °Skin for abnormal spots. °What immunizations do I need? °Vaccines are usually given at various ages, according to a schedule. Your health care provider will recommend vaccines for you based on your age, medical history, and lifestyle or other factors, such as travel or where you work. °What tests do I need? °Screening °Your health care provider may recommend screening tests for certain conditions. This may include: °Pelvic exam and Pap test. °Lipid and cholesterol levels. °Diabetes screening. This is done by checking your blood sugar (glucose) after you have not eaten for a while (fasting). °Hepatitis B  test. °Hepatitis C test. °HIV (human immunodeficiency virus) test. °STI (sexually transmitted infection) testing, if you are at risk. °BRCA-related cancer screening. This may be done if you have a family history of breast, ovarian, tubal, or peritoneal cancers. °Talk with your health care provider about your test results, treatment options, and if necessary, the need for more tests. °Follow these instructions at home: °Eating and drinking ° °Eat a healthy diet that includes fresh fruits and vegetables, whole grains, lean protein, and low-fat dairy products. °Take vitamin and mineral supplements as recommended by your health care provider. °Do not drink alcohol if: °Your health care provider tells you not to drink. °You are pregnant, may be pregnant, or are planning to become pregnant. °If you drink alcohol: °Limit how much you have to 0-1 drink a day. °Know how much alcohol is in your drink. In the [place] one drink equals one 12 oz bottle of beer (355 mL), one 5 oz glass of wine (148 mL), or one 1½ oz glass of hard liquor (44 mL). °Lifestyle °Brush your teeth every morning and night with fluoride toothpaste. Floss one time each day. °Exercise for at least 30 minutes 5 or more days each week. °Do not use any products that contain nicotine or tobacco. These products include cigarettes, chewing tobacco, and vaping devices, such as e-cigarettes. If you need help quitting, ask your health care provider. °Do not use drugs. °If you are sexually active, practice safe sex. Use a condom or other form of protection to prevent STIs. °If you do not wish to become pregnant, use a form of birth control. If you plan to become pregnant, see your health care provider for a prepregnancy visit. °Find healthy ways to manage stress, such as: °Meditation, yoga,   or listening to music. °Journaling. °Talking to a trusted person. °Spending time with friends and family. °Minimize exposure to UV radiation to reduce your risk of skin  cancer. °Safety °Always wear your seat belt while driving or riding in a vehicle. °Do not drive: °If you have been drinking alcohol. Do not ride with someone who has been drinking. °If you have been using any mind-altering substances or drugs. °While texting. °When you are tired or distracted. °Wear a helmet and other protective equipment during sports activities. °If you have firearms in your house, make sure you follow all gun safety procedures. °Seek help if you have been physically or sexually abused. °What's next? °Go to your health care provider once a year for an annual wellness visit. °Ask your health care provider how often you should have your eyes and teeth checked. °Stay up to date on all vaccines. °This information is not intended to replace advice given to you by your health care provider. Make sure you discuss any questions you have with your health care provider. °Document Revised: 04/02/2021 Document Reviewed: 04/02/2021 °Elsevier Patient Education © 2022 [employer] Inc. ° °

## 2021-11-11 ENCOUNTER — Other Ambulatory Visit: Payer: Self-pay | Admitting: Certified Nurse Midwife

## 2021-11-11 ENCOUNTER — Encounter: Payer: Self-pay | Admitting: Certified Nurse Midwife

## 2021-11-11 LAB — CERVICOVAGINAL ANCILLARY ONLY
Bacterial Vaginitis (gardnerella): POSITIVE — AB
Candida Glabrata: NEGATIVE
Candida Vaginitis: POSITIVE — AB
Comment: NEGATIVE
Comment: NEGATIVE
Comment: NEGATIVE

## 2021-11-11 LAB — HEPATITIS C ANTIBODY: Hep C Virus Ab: <0.1 s/co ratio (ref 0.0–0.9)

## 2021-11-11 MED ORDER — FLUCONAZOLE 150 MG PO TABS: 150.0000 mg | ORAL_TABLET | Freq: Every day | ORAL | 0 refills | Status: DC

## 2021-11-11 MED ORDER — METRONIDAZOLE 500 MG PO TABS: 500.0000 mg | ORAL_TABLET | Freq: Two times a day (BID) | ORAL | 0 refills | Status: AC

## 2022-04-05 ENCOUNTER — Emergency Department: Payer: Medicaid Other

## 2022-04-05 ENCOUNTER — Emergency Department
Admission: EM | Admit: 2022-04-05 | Discharge: 2022-04-05 | Discharge status: Against Medical Advice | Payer: Medicaid Other | Attending: Emergency Medicine | Admitting: Emergency Medicine

## 2022-04-05 ENCOUNTER — Encounter: Payer: Self-pay | Admitting: Medical Oncology

## 2022-04-05 DIAGNOSIS — R079 Chest pain, unspecified: Secondary | ICD-10-CM | POA: Insufficient documentation

## 2022-04-05 DIAGNOSIS — R0602 Shortness of breath: Secondary | ICD-10-CM | POA: Insufficient documentation

## 2022-04-05 DIAGNOSIS — Z5321 Procedure and treatment not carried out due to patient leaving prior to being seen by health care provider: Secondary | ICD-10-CM | POA: Insufficient documentation

## 2022-04-05 HISTORY — DX: Multiple sclerosis: G35

## 2022-04-05 LAB — BASIC METABOLIC PANEL
Anion gap: 12 (ref 5–15)
BUN: 6 mg/dL (ref 6–20)
CO2: 22 mmol/L (ref 22–32)
Calcium: 9.5 mg/dL (ref 8.9–10.3)
Chloride: 107 mmol/L (ref 98–111)
Creatinine, Ser: 0.58 mg/dL (ref 0.44–1.00)
GFR, Estimated: >60 mL/min (ref >60)
Glucose, Bld: 96 mg/dL (ref 70–99)
Potassium: 3.9 mmol/L (ref 3.5–5.1)
Sodium: 141 mmol/L (ref 135–145)

## 2022-04-05 LAB — CBC
HCT: 43.3 % (ref 36.0–46.0)
Hemoglobin: 14 g/dL (ref 12.0–15.0)
MCH: 29.2 pg (ref 26.0–34.0)
MCHC: 32.3 g/dL (ref 30.0–36.0)
MCV: 90.4 fL (ref 80.0–100.0)
Platelets: 399 10*3/uL (ref 150–400)
RBC: 4.79 MIL/uL (ref 3.87–5.11)
RDW: 12.7 % (ref 11.5–15.5)
WBC: 9.7 10*3/uL (ref 4.0–10.5)
nRBC: 0 % (ref 0.0–0.2)

## 2022-04-05 LAB — TROPONIN I (HIGH SENSITIVITY): Troponin I (High Sensitivity): 3 ng/L (ref <18)

## 2022-04-05 NOTE — ED Notes (Signed)
Called pt to room

## 2022-04-05 NOTE — ED Notes (Addendum)
Pt with husband to desk- loud stating his "wife needs a bed right now, she's having chest pain and cant breathe". "You arent going to take me seriously then I'll take her elsewhere". Explained to patient and husband that lab work was completed and it looked reassuring and that as soon as we get beds available we will get her back. Pt was also seen by PA in triage who concurs. Pt seen being wheeled out of department by husband.

## 2022-04-05 NOTE — ED Provider Triage Note (Signed)
Emergency Medicine Provider Triage Evaluation Note  [facility] , a 31 y.o. female  with a history of MS who was evaluated in triage.  Pt complains of CP and SOB that began today while sitting down at the house. LMP 6/15. No history DVT/PE. No recent infectious type symptoms.  Patient Active Problem List   Diagnosis Date Noted   High risk medication use 06/17/2021   Numbness and tingling of right lower extremity    Acute focal neurological deficit 06/06/2021   Right leg weakness 06/06/2021   Demyelinating lesion (HCC)    Multiple sclerosis (HCC)    Tenderness of female pelvic organs 03/20/2020   Vitamin D deficiency 03/20/2020   Fatigue 03/20/2020   Weight gain 03/20/2020   Body mass index (BMI) of 31.0-31.9 in adult 03/20/2020   Screening for blood or protein in urine 03/20/2020   Dysmenorrhea 05/30/2019   History of gestational hypertension 10/07/2017     Review of Systems  Positive: CP/ SOB Negative: N/V/D  Physical Exam  There were no vitals taken for this visit. Gen:   Awake. Tearful Resp:  Normal effort. Tachypneic MSK:   Moves extremities without difficulty  Other:  Anxious appearing  Medical Decision Making  Medically screening exam initiated at 11:07 AM.  Appropriate orders placed.  Ayla Leas was informed that the remainder of the evaluation will be completed by another provider, this initial triage assessment does not replace that evaluation, and the importance of remaining in the ED until their evaluation is complete.     Jackelyn Hoehn, PA-C 04/05/22 1112

## 2022-04-05 NOTE — ED Notes (Signed)
Pt at desk, husband- demanding that she be seen, stating "we aren't lab rats, we don't come here for nothing and to be poked on". Explained that pt would be seen as soon as possible.

## 2022-04-05 NOTE — ED Notes (Signed)
Called pt to room 

## 2022-04-05 NOTE — ED Triage Notes (Signed)
Pt tearful in triage reporting that she began this am having chest pain and sob.

## 2022-10-19 NOTE — L&D Delivery Note (Signed)
Delivery Summary for [facility]  Labor Events:   Preterm labor: No data found  Rupture date: 06/13/2023  Rupture time: 10:00 PM  Rupture type: Spontaneous  Fluid Color: Clear  Induction: No data found  Augmentation: No data found  Complications: No data found  Cervical ripening: No data found No data found   No data found     Delivery:   Episiotomy: No data found  Lacerations: No data found  Repair suture: No data found  Repair # of packets: No data found  Blood loss (ml): 350   Information for the patient's newborn:  Dellanira, Zoellner [[id number]]   Delivery 06/14/2023 8:10 AM by  Vaginal, Vacuum (Extractor) Sex:  female Gestational Age: [redacted]w[redacted]d Delivery Clinician:   Living?:         APGARS  One minute Five minutes Ten minutes  Skin color:        Heart rate:        Grimace:        Muscle tone:        Breathing:        Totals: 8  9      Presentation/position:      Resuscitation:   Cord information:    Disposition of cord blood:     Blood gases sent?  Complications:   Placenta: Delivered:       appearance Newborn Measurements: Weight: 8 lb 0.4 oz (3640 g)  Height: 21.26"  Head circumference:    Chest circumference:    Other providers:    Additional  information: Forceps:   Vacuum:   Breech:   Observed anomalies        I was called by Doreene Burke, CNM at 0720 to assess patient due to persistent fetal (and maternal tachycardia) with minimal variability, last cervical exam was 9.5/100/-2 station. Patient GBS positive, allergies to PCN. Had received initial dose of Clindamycin at 0150. Previously attempted to reduce anterior lip and begin pushing however due to fetal intolerance, and new onset of chorioamnionitis, discontinued pushing to administer antibiotics (Gentamicin and Vancomycin) at my request ~ 1 hour prior.  Upon my arrival, patient lying on right side, breathing through her contractions. Has epidural in place.  Cervical exam 9.5/100/0 to +1 station.  Fetal tachycardia present, 180-190s, with minimal variability. Attempted pushing with anterior lip easily reducible after 2 pushes.  Patient able to descend station to +2 station after approximately 10-15 minutes of pushing. Decision was made at that time to proceed with vacuum to expedite delivery due to Category II tracing.   Procedure:  Operative Delivery Note  Verbal consent: obtained from patient.  Risks and benefits discussed in detail.  Risks include, but are not limited to the risks of anesthesia, bleeding, infection, damage to maternal tissues, fetal cephalhematoma.  There is also the risk of inability to effect vaginal delivery of the head, or shoulder dystocia that cannot be resolved by established maneuvers, leading to the need for emergency cesarean section.  With the cervical exam 10/10/+2  the flat Kiwi vacuum was applied by Dr. Valentino Saxon, and after good position of the vacuum noted, gentle traction was applied during uterine contraction and maternal pushing resulting in good decent of the fetal head.   0 pop-offs occurred.   At 8:10 AM a viable and healthy female was delivered via Vaginal, Spontaneous.  Presentation: vertex; Position: Left,, Occiput,, Anterior;   The fetal head was then at +3 station and with excellent maternal effort she had vaginal delivery of a  3640 gram female infant with Apgars 8 and 9@ 1 and 5 minutes, respectively over an intact perineum.  No nuchal cord present. The remainder of the infant was delivered without difficulty.  The cord was clamped and cut  and the infant was handed to the awaiting neonatal transition team. Cord blood was collected.  Delayed cord clamping was observed.   Placenta delivered spontaneously and intact with 3VC.  No lacerations noted.  Superficial introitus abrasions were noted not needing repair.      APGAR: 8, 9; weight  3640 grams.   Placenta status: spontaneously removed, intact.   Cord:  with the following complications: none.   Delayed cord clamping observed. Cord pH: not obtained.   Anesthesia:  Epidural Instruments: Kiwi flat vacuum Episiotomy: None Lacerations: Periurethral abrasion Suture Repair:  None Est. Blood Loss (mL): 350  Mom to postpartum.  Baby to Couplet care / Skin to Skin.   Hildred Laser, MD 06/14/2023, 8:45 AM

## 2022-10-26 ENCOUNTER — Ambulatory Visit (INDEPENDENT_AMBULATORY_CARE_PROVIDER_SITE_OTHER): Payer: Medicaid Other

## 2022-10-26 ENCOUNTER — Other Ambulatory Visit: Payer: Self-pay

## 2022-10-26 VITALS — BP 123/82 | HR 96 | Ht 61.0 in | Wt 154.0 lb

## 2022-10-26 DIAGNOSIS — Z3200 Encounter for pregnancy test, result unknown: Secondary | ICD-10-CM

## 2022-10-26 DIAGNOSIS — O26859 Spotting complicating pregnancy, unspecified trimester: Secondary | ICD-10-CM

## 2022-10-26 DIAGNOSIS — Z3201 Encounter for pregnancy test, result positive: Secondary | ICD-10-CM | POA: Diagnosis not present

## 2022-10-26 LAB — POCT URINE PREGNANCY: Preg Test, Ur: POSITIVE — AB

## 2022-10-26 NOTE — Progress Notes (Signed)
    NURSE VISIT NOTE  Subjective:    Patient ID: Jessica Mcintosh, female    DOB: [birth year], 32 y.o.   MRN: [id number]  HPI  Patient is a 32 y.o. G74P1001 female who presents for evaluation of amenorrhea. She believes she could be pregnant. . Current symptoms also include:  spotting,breast tenderness and fatigue  . Last period was normal.    Objective:    There were no vitals taken for this visit.  Lab Review  No results found for any visits on 10/26/22.  Assessment:   No diagnosis found.  Plan:   Pregnancy Test: Positive  Encouraged well-balanced diet, plenty of rest when needed, pre-natal vitamins daily and walking for exercise.  Discussed self-help for nausea, avoiding OTC medications until consulting provider or pharmacist, other than Tylenol as needed, minimal caffeine (1-2 cups daily) and avoiding alcohol.   She will schedule her nurse visit @ [redacted] wks pregnant, u/s for dating and labs @10  wk, and NOB visit at [redacted] wk pregnant.    Feel free to call with any questions.     Landis Gandy, [place]

## 2022-10-27 LAB — BETA HCG QUANT (REF LAB): hCG Quant: 31459 m[IU]/mL

## 2022-10-28 ENCOUNTER — Other Ambulatory Visit: Payer: Medicaid Other

## 2022-10-29 ENCOUNTER — Other Ambulatory Visit: Payer: Medicaid Other

## 2022-10-29 DIAGNOSIS — O26859 Spotting complicating pregnancy, unspecified trimester: Secondary | ICD-10-CM

## 2022-10-30 LAB — BETA HCG QUANT (REF LAB): hCG Quant: 52775 m[IU]/mL

## 2022-11-02 ENCOUNTER — Ambulatory Visit (INDEPENDENT_AMBULATORY_CARE_PROVIDER_SITE_OTHER): Payer: Medicaid Other

## 2022-11-02 VITALS — Wt 154.0 lb

## 2022-11-02 DIAGNOSIS — Z3689 Encounter for other specified antenatal screening: Secondary | ICD-10-CM

## 2022-11-02 DIAGNOSIS — Z3480 Encounter for supervision of other normal pregnancy, unspecified trimester: Secondary | ICD-10-CM | POA: Insufficient documentation

## 2022-11-02 DIAGNOSIS — Z369 Encounter for antenatal screening, unspecified: Secondary | ICD-10-CM

## 2022-11-02 NOTE — Patient Instructions (Signed)
First Trimester of Pregnancy  The first trimester of pregnancy starts on the first day of your last menstrual period until the end of week 12. This is also called months 1 through 3 of pregnancy. Body changes during your first trimester Your body goes through many changes during pregnancy. The changes usually return to normal after your baby is born. Physical changes You may gain or lose weight. Your breasts may grow larger and hurt. The area around your nipples may get darker. Dark spots or blotches may develop on your face. You may have changes in your hair. Health changes You may feel like you might vomit (nauseous), and you may vomit. You may have heartburn. You may have headaches. You may have trouble pooping (constipation). Your gums may bleed. Other changes You may get tired easily. You may pee (urinate) more often. Your menstrual periods will stop. You may not feel hungry. You may want to eat certain kinds of food. You may have changes in your emotions from day to day. You may have more dreams. Follow these instructions at home: Medicines Take over-the-counter and prescription medicines only as told by your doctor. Some medicines are not safe during pregnancy. Take a prenatal vitamin that contains at least 600 micrograms (mcg) of folic acid. Eating and drinking Eat healthy meals that include: Fresh fruits and vegetables. Whole grains. Good sources of protein, such as meat, eggs, or tofu. Low-fat dairy products. Avoid raw meat and unpasteurized juice, milk, and cheese. If you feel like you may vomit, or you vomit: Eat 4 or 5 small meals a day instead of 3 large meals. Try eating a few soda crackers. Drink liquids between meals instead of during meals. You may need to take these actions to prevent or treat trouble pooping: Drink enough fluids to keep your pee (urine) pale yellow. Eat foods that are high in fiber. These include beans, whole grains, and fresh fruits and  vegetables. Limit foods that are high in fat and sugar. These include fried or sweet foods. Activity Exercise only as told by your doctor. Most people can do their usual exercise routine during pregnancy. Stop exercising if you have cramps or pain in your lower belly (abdomen) or low back. Do not exercise if it is too hot or too humid, or if you are in a place of great height (high altitude). Avoid heavy lifting. If you choose to, you may have sex unless your doctor tells you not to. Relieving pain and discomfort Wear a good support bra if your breasts are sore. Rest with your legs raised (elevated) if you have leg cramps or low back pain. If you have bulging veins (varicose veins) in your legs: Wear support hose as told by your doctor. Raise your feet for 15 minutes, 3-4 times a day. Limit salt in your food. Safety Wear your seat belt at all times when you are in a car. Talk with your doctor if someone is hurting you or yelling at you. Talk with your doctor if you are feeling sad or have thoughts of hurting yourself. Lifestyle Do not use hot tubs, steam rooms, or saunas. Do not douche. Do not use tampons or scented sanitary pads. Do not use herbal medicines, illegal drugs, or medicines that are not approved by your doctor. Do not drink alcohol. Do not smoke or use any products that contain nicotine or tobacco. If you need help quitting, ask your doctor. Avoid cat litter boxes and soil that is used by cats. These carry  germs that can cause harm to the baby and can cause a loss of your baby by miscarriage or stillbirth. General instructions Keep all follow-up visits. This is important. Ask for help if you need counseling or if you need help with nutrition. Your doctor can give you advice or tell you where to go for help. Visit your dentist. At home, brush your teeth with a soft toothbrush. Floss gently. Write down your questions. Take them to your prenatal visits. Where to find more  information American Pregnancy Association: americanpregnancy.org American College of Obstetricians and Gynecologists: www.acog.org Office on Women's Health: womenshealth.gov/pregnancy Contact a doctor if: You are dizzy. You have a fever. You have mild cramps or pressure in your lower belly. You have a nagging pain in your belly area. You continue to feel like you may vomit, you vomit, or you have watery poop (diarrhea) for 24 hours or longer. You have a bad-smelling fluid coming from your vagina. You have pain when you pee. You are exposed to a disease that spreads from person to person, such as chickenpox, measles, Zika virus, HIV, or hepatitis. Get help right away if: You have spotting or bleeding from your vagina. You have very bad belly cramping or pain. You have shortness of breath or chest pain. You have any kind of injury, such as from a fall or a car crash. You have new or increased pain, swelling, or redness in an arm or leg. Summary The first trimester of pregnancy starts on the first day of your last menstrual period until the end of week 12 (months 1 through 3). Eat 4 or 5 small meals a day instead of 3 large meals. Do not smoke or use any products that contain nicotine or tobacco. If you need help quitting, ask your doctor. Keep all follow-up visits. This information is not intended to replace advice given to you by your health care provider. Make sure you discuss any questions you have with your health care provider. Document Revised: 03/13/2020 Document Reviewed: 01/18/2020 Elsevier Patient Education  2023 Elsevier Inc. Commonly Asked Questions During Pregnancy  Cats: A parasite can be excreted in cat feces.  To avoid exposure you need to have another person empty the little box.  If you must empty the litter box you will need to wear gloves.  Wash your hands after handling your cat.  This parasite can also be found in raw or undercooked meat so this should also be  avoided.  Colds, Sore Throats, Flu: Please check your medication sheet to see what you can take for symptoms.  If your symptoms are unrelieved by these medications please call the office.  Dental Work: Most any dental work your dentist recommends is permitted.  X-rays should only be taken during the first trimester if absolutely necessary.  Your abdomen should be shielded with a lead apron during all x-rays.  Please notify your provider prior to receiving any x-rays.  Novocaine is fine; gas is not recommended.  If your dentist requires a note from us prior to dental work please call the office and we will provide one for you.  Exercise: Exercise is an important part of staying healthy during your pregnancy.  You may continue most exercises you were accustomed to prior to pregnancy.  Later in your pregnancy you will most likely notice you have difficulty with activities requiring balance like riding a bicycle.  It is important that you listen to your body and avoid activities that put you at a higher   risk of falling.  Adequate rest and staying well hydrated are a must!  If you have questions about the safety of specific activities ask your provider.    Exposure to Children with illness: Try to avoid obvious exposure; report any symptoms to [place] when noted,  If you have chicken pos, red measles or mumps, you should be immune to these diseases.   Please do not take any vaccines while pregnant unless you have checked with your OB provider.  Fetal Movement: After 28 weeks we recommend you do "kick counts" twice daily.  Lie or sit down in a calm quiet environment and count your baby movements "kicks".  You should feel your baby at least 10 times per hour.  If you have not felt 10 kicks within the first hour get up, walk around and have something sweet to eat or drink then repeat for an additional hour.  If count remains less than 10 per hour notify your provider.  Fumigating: Follow your pest control agent's  advice as to how long to stay out of your home.  Ventilate the area well before re-entering.  Hemorrhoids:   Most over-the-counter preparations can be used during pregnancy.  Check your medication to see what is safe to use.  It is important to use a stool softener or fiber in your diet and to drink lots of liquids.  If hemorrhoids seem to be getting worse please call the office.   Hot Tubs:  Hot tubs Jacuzzis and saunas are not recommended while pregnant.  These increase your internal body temperature and should be avoided.  Intercourse:  Sexual intercourse is safe during pregnancy as long as you are comfortable, unless otherwise advised by your provider.  Spotting may occur after intercourse; report any bright red bleeding that is heavier than spotting.  Labor:  If you know that you are in labor, please go to the hospital.  If you are unsure, please call the office and let us help you decide what to do.  Lifting, straining, etc:  If your job requires heavy lifting or straining please check with your provider for any limitations.  Generally, you should not lift items heavier than that you can lift simply with your hands and arms (no back muscles)  Painting:  Paint fumes do not harm your pregnancy, but may make you ill and should be avoided if possible.  Latex or water based paints have less odor than oils.  Use adequate ventilation while painting.  Permanents & Hair Color:  Chemicals in hair dyes are not recommended as they cause increase hair dryness which can increase hair loss during pregnancy.  " Highlighting" and permanents are allowed.  Dye may be absorbed differently and permanents may not hold as well during pregnancy.  Sunbathing:  Use a sunscreen, as skin burns easily during pregnancy.  Drink plenty of fluids; avoid over heating.  Tanning Beds:  Because their possible side effects are still unknown, tanning beds are not recommended.  Ultrasound Scans:  Routine ultrasounds are performed  at approximately 20 weeks.  You will be able to see your baby's general anatomy an if you would like to know the gender this can usually be determined as well.  If it is questionable when you conceived you may also receive an ultrasound early in your pregnancy for dating purposes.  Otherwise ultrasound exams are not routinely performed unless there is a medical necessity.  Although you can request a scan we ask that you pay for it when  conducted because insurance does not cover " patient request" scans.  Work: If your pregnancy proceeds without complications you may work until your due date, unless your physician or employer advises otherwise.  Round Ligament Pain/Pelvic Discomfort:  Sharp, shooting pains not associated with bleeding are fairly common, usually occurring in the second trimester of pregnancy.  They tend to be worse when standing up or when you remain standing for long periods of time.  These are the result of pressure of certain pelvic ligaments called "round ligaments".  Rest, Tylenol and heat seem to be the most effective relief.  As the womb and fetus grow, they rise out of the pelvis and the discomfort improves.  Please notify the office if your pain seems different than that described.  It may represent a more serious condition.  Common Medications Safe in Pregnancy  Acne:      Constipation:  Benzoyl Peroxide     Colace  Clindamycin      Dulcolax Suppository  Topica Erythromycin     Fibercon  Salicylic Acid      Metamucil         Miralax AVOID:        Senakot   Accutane    Cough:  Retin-A       Cough Drops  Tetracycline      Phenergan w/ Codeine if Rx  Minocycline      Robitussin (Plain & DM)  Antibiotics:     Crabs/Lice:  Ceclor       RID  Cephalosporins    AVOID:  E-Mycins      Kwell  Keflex  Macrobid/Macrodantin   Diarrhea:  Penicillin      Kao-Pectate  Zithromax      Imodium AD         PUSH FLUIDS AVOID:       Cipro     Fever:  Tetracycline      Tylenol (Regular  or Extra  Minocycline       Strength)  Levaquin      Extra Strength-Do not          Exceed 8 tabs/24 hrs Caffeine:        <273m/day (equiv. To 1 cup of coffee or  approx. 3 12 oz sodas)         Gas: Cold/Hayfever:       Gas-X  Benadryl      Mylicon  Claritin       Phazyme  **Claritin-D        Chlor-Trimeton    Headaches:  Dimetapp      ASA-Free Excedrin  Drixoral-Non-Drowsy     Cold Compress  Mucinex (Guaifenasin)     Tylenol (Regular or Extra  Sudafed/Sudafed-12 Hour     Strength)  **Sudafed PE Pseudoephedrine   Tylenol Cold & Sinus     Vicks Vapor Rub  Zyrtec  **AVOID if Problems With Blood Pressure         Heartburn: Avoid lying down for at least 1 hour after meals  Aciphex      Maalox     Rash:  Milk of Magnesia     Benadryl    Mylanta       1% Hydrocortisone Cream  Pepcid  Pepcid Complete   Sleep Aids:  Prevacid      Ambien   Prilosec       Benadryl  Rolaids       Chamomile Tea  Tums (Limit 4/day)     Unisom  Tylenol PM         Warm milk-add vanilla or  Hemorrhoids:       Sugar for taste  Anusol/Anusol H.C.  (RX: Analapram 2.5%)  Sugar Substitutes:  Hydrocortisone OTC     Ok in moderation  Preparation H      Tucks        Vaseline lotion applied to tissue with wiping    Herpes:     Throat:  Acyclovir      Oragel  Famvir  Valtrex     Vaccines:         Flu Shot Leg Cramps:       *Gardasil  Benadryl      Hepatitis A         Hepatitis B Nasal Spray:       Pneumovax  Saline Nasal Spray     Polio Booster         Tetanus Nausea:       Tuberculosis test or PPD  Vitamin B6 25 mg TID   AVOID:    Dramamine      *Gardasil  Emetrol       Live Poliovirus  Ginger Root 250 mg QID    MMR (measles, mumps &  High Complex Carbs @ Bedtime    rebella)  Sea Bands-Accupressure    Varicella (Chickenpox)  Unisom 1/2 tab TID     *No known complications           If received before Pain:         Known pregnancy;   Darvocet       Resume series  after  Lortab        Delivery  Percocet    Yeast:   Tramadol      Femstat  Tylenol 3      Gyne-lotrimin  Ultram       Monistat  Vicodin           MISC:         All Sunscreens           Hair Coloring/highlights          Insect Repellant's          (Including DEET)         Mystic Tans

## 2022-11-02 NOTE — Progress Notes (Signed)
New OB Intake  I connected with  [facility] on 11/02/22 at 11:15 AM EST by telephone and verified that I am speaking with the correct person using two identifiers. Nurse is located at [employer] and pt is located at home.  I explained I am completing New OB Intake today. We discussed her EDD of 06/16/2023 that is based on LMP of 09/09/2022. Pt is G2/P1001. I reviewed her allergies, medications, Medical/Surgical/OB history, and appropriate screenings. There are cats in the home  no  Based on history, this is a/an pregnancy uncomplicated .   Patient Active Problem List   Diagnosis Date Noted   Supervision of other normal pregnancy, antepartum 11/02/2022   High risk medication use 06/17/2021   Numbness and tingling of right lower extremity    Acute focal neurological deficit 06/06/2021   Right leg weakness 06/06/2021   Demyelinating lesion ([place])    Multiple sclerosis ([place])    Tenderness of female pelvic organs 03/20/2020   Vitamin D deficiency 03/20/2020   Fatigue 03/20/2020   Weight gain 03/20/2020   Body mass index (BMI) of 31.0-31.9 in adult 03/20/2020   Screening for blood or protein in urine 03/20/2020   Dysmenorrhea 05/30/2019   History of gestational hypertension 10/07/2017    Concerns addressed today None  Delivery Plans:  Plans to deliver at [facility].  Anatomy US Explained first scheduled [place] will be scheduled soon and an anatomy scan will be done at 20 weeks.  Labs Discussed genetic screening with patient. Patient desires genetic testing to be drawn with new OB labs. Discussed possible labs to be drawn at new OB appointment.  COVID Vaccine Patient has not had COVID vaccine.   Social Determinants of Health Food Insecurity: denies food insecurity Transportation: Patient denies transportation needs. Childcare: Discussed no children allowed at ultrasound appointments.   First visit review I reviewed new OB appt with pt. I explained she will  have ob bloodwork and pap smear/pelvic exam if indicated. Explained pt will be seen by Philip Aspen, CNM at first visit; encounter routed to appropriate provider.   Cleophas Dunker, [facility] 11/02/2022  11:42 AM

## 2022-11-02 NOTE — Progress Notes (Signed)
All appointments have been scheduled. NOB physical with annie thompson per patient request.

## 2022-11-16 ENCOUNTER — Encounter: Payer: Medicaid Other | Admitting: Certified Nurse Midwife

## 2022-11-18 ENCOUNTER — Other Ambulatory Visit: Payer: Self-pay

## 2022-11-18 DIAGNOSIS — Z369 Encounter for antenatal screening, unspecified: Secondary | ICD-10-CM

## 2022-11-18 DIAGNOSIS — Z3480 Encounter for supervision of other normal pregnancy, unspecified trimester: Secondary | ICD-10-CM

## 2022-11-24 ENCOUNTER — Other Ambulatory Visit: Payer: Medicaid Other

## 2022-11-26 ENCOUNTER — Other Ambulatory Visit: Payer: Self-pay | Admitting: Certified Nurse Midwife

## 2022-11-26 ENCOUNTER — Ambulatory Visit
Admission: RE | Admit: 2022-11-26 | Discharge: 2022-11-26 | Disposition: A | Discharge status: Home / Self Care | Payer: Medicaid Other | Source: Ambulatory Visit | Attending: Certified Nurse Midwife | Admitting: Certified Nurse Midwife

## 2022-11-26 DIAGNOSIS — Z3A11 11 weeks gestation of pregnancy: Secondary | ICD-10-CM | POA: Insufficient documentation

## 2022-11-26 DIAGNOSIS — Z369 Encounter for antenatal screening, unspecified: Secondary | ICD-10-CM | POA: Insufficient documentation

## 2022-11-26 DIAGNOSIS — Z3480 Encounter for supervision of other normal pregnancy, unspecified trimester: Secondary | ICD-10-CM | POA: Insufficient documentation

## 2022-12-04 ENCOUNTER — Other Ambulatory Visit (HOSPITAL_COMMUNITY)
Admission: RE | Admit: 2022-12-04 | Discharge: 2022-12-04 | Disposition: A | Discharge status: Home / Self Care | Payer: Medicaid Other | Source: Ambulatory Visit | Attending: Certified Nurse Midwife | Admitting: Certified Nurse Midwife

## 2022-12-04 ENCOUNTER — Other Ambulatory Visit: Payer: Medicaid Other

## 2022-12-04 ENCOUNTER — Other Ambulatory Visit: Payer: Self-pay

## 2022-12-04 ENCOUNTER — Encounter: Payer: Medicaid Other | Admitting: Certified Nurse Midwife

## 2022-12-04 DIAGNOSIS — Z113 Encounter for screening for infections with a predominantly sexual mode of transmission: Secondary | ICD-10-CM | POA: Insufficient documentation

## 2022-12-04 DIAGNOSIS — Z1379 Encounter for other screening for genetic and chromosomal anomalies: Secondary | ICD-10-CM

## 2022-12-04 DIAGNOSIS — Z369 Encounter for antenatal screening, unspecified: Secondary | ICD-10-CM | POA: Insufficient documentation

## 2022-12-18 ENCOUNTER — Other Ambulatory Visit: Payer: Medicaid Other

## 2022-12-19 LAB — URINALYSIS, ROUTINE W REFLEX MICROSCOPIC
Bilirubin, UA: NEGATIVE
Glucose, UA: NEGATIVE
Ketones, UA: NEGATIVE
Leukocytes,UA: NEGATIVE
Nitrite, UA: NEGATIVE
RBC, UA: NEGATIVE
Specific Gravity, UA: 1.019 (ref 1.005–1.030)
Urobilinogen, Ur: 0.2 mg/dL (ref 0.2–1.0)
pH, UA: 6.5 (ref 5.0–7.5)

## 2022-12-19 LAB — CBC/D/PLT+RPR+RH+ABO+RUBIGG...
Antibody Screen: NEGATIVE
Basophils Absolute: 0 10*3/uL (ref 0.0–0.2)
Basos: 0 %
EOS (ABSOLUTE): 0 10*3/uL (ref 0.0–0.4)
Eos: 0 %
HCV Ab: NONREACTIVE
HIV Screen 4th Generation wRfx: NONREACTIVE
Hematocrit: 38.2 % (ref 34.0–46.6)
Hemoglobin: 12.4 g/dL (ref 11.1–15.9)
Hepatitis B Surface Ag: NEGATIVE
Immature Grans (Abs): 0.1 10*3/uL (ref 0.0–0.1)
Immature Granulocytes: 1 %
Lymphocytes Absolute: 1 10*3/uL (ref 0.7–3.1)
Lymphs: 9 %
MCH: 29.7 pg (ref 26.6–33.0)
MCHC: 32.5 g/dL (ref 31.5–35.7)
MCV: 92 fL (ref 79–97)
Monocytes Absolute: 0.4 10*3/uL (ref 0.1–0.9)
Monocytes: 4 %
Neutrophils Absolute: 9.2 10*3/uL — ABNORMAL HIGH (ref 1.4–7.0)
Neutrophils: 86 %
Platelets: 287 10*3/uL (ref 150–450)
RBC: 4.17 x10E6/uL (ref 3.77–5.28)
RDW: 12.6 % (ref 11.7–15.4)
RPR Ser Ql: NONREACTIVE
Rh Factor: POSITIVE
Rubella Antibodies, IGG: 2.58 index (ref >0.99)
Varicella zoster IgG: 1466 index (ref >165)
WBC: 10.7 10*3/uL (ref 3.4–10.8)

## 2022-12-19 LAB — HCV INTERPRETATION

## 2022-12-21 ENCOUNTER — Ambulatory Visit (INDEPENDENT_AMBULATORY_CARE_PROVIDER_SITE_OTHER): Payer: Medicaid Other | Admitting: Obstetrics

## 2022-12-21 ENCOUNTER — Encounter: Payer: Self-pay | Admitting: Obstetrics

## 2022-12-21 ENCOUNTER — Other Ambulatory Visit (HOSPITAL_COMMUNITY)
Admission: RE | Admit: 2022-12-21 | Discharge: 2022-12-21 | Disposition: A | Discharge status: Home / Self Care | Payer: Medicaid Other | Source: Ambulatory Visit | Attending: Certified Nurse Midwife | Admitting: Certified Nurse Midwife

## 2022-12-21 VITALS — BP 120/70 | HR 70 | Wt 146.9 lb

## 2022-12-21 DIAGNOSIS — Z113 Encounter for screening for infections with a predominantly sexual mode of transmission: Secondary | ICD-10-CM

## 2022-12-21 DIAGNOSIS — Z3480 Encounter for supervision of other normal pregnancy, unspecified trimester: Secondary | ICD-10-CM | POA: Insufficient documentation

## 2022-12-21 DIAGNOSIS — Z3A14 14 weeks gestation of pregnancy: Secondary | ICD-10-CM | POA: Diagnosis not present

## 2022-12-21 DIAGNOSIS — Z124 Encounter for screening for malignant neoplasm of cervix: Secondary | ICD-10-CM

## 2022-12-21 DIAGNOSIS — O99352 Diseases of the nervous system complicating pregnancy, second trimester: Secondary | ICD-10-CM

## 2022-12-21 DIAGNOSIS — G35 Multiple sclerosis: Secondary | ICD-10-CM | POA: Diagnosis not present

## 2022-12-21 LAB — POCT URINALYSIS DIPSTICK OB
Bilirubin, UA: NEGATIVE
Blood, UA: NEGATIVE
Glucose, UA: NEGATIVE
Ketones, UA: NEGATIVE
Leukocytes, UA: NEGATIVE
Nitrite, UA: NEGATIVE
POC,PROTEIN,UA: NEGATIVE
Spec Grav, UA: 1.015 (ref 1.010–1.025)
Urobilinogen, UA: 0.2 E.U./dL
pH, UA: 6.5 (ref 5.0–8.0)

## 2022-12-21 LAB — MONITOR DRUG PROFILE 14(MW)
Amphetamine Scrn, Ur: NEGATIVE ng/mL
BARBITURATE SCREEN URINE: NEGATIVE ng/mL
BENZODIAZEPINE SCREEN, URINE: NEGATIVE ng/mL
Buprenorphine, Urine: NEGATIVE ng/mL
CANNABINOIDS UR QL SCN: NEGATIVE ng/mL
Cocaine (Metab) Scrn, Ur: NEGATIVE ng/mL
Creatinine(Crt), U: 127.4 mg/dL (ref 20.0–300.0)
Fentanyl, Urine: NEGATIVE pg/mL
Meperidine Screen, Urine: NEGATIVE ng/mL
Methadone Screen, Urine: NEGATIVE ng/mL
OXYCODONE+OXYMORPHONE UR QL SCN: NEGATIVE ng/mL
Opiate Scrn, Ur: NEGATIVE ng/mL
Ph of Urine: 6.5 (ref 4.5–8.9)
Phencyclidine Qn, Ur: NEGATIVE ng/mL
Propoxyphene Scrn, Ur: NEGATIVE ng/mL
SPECIFIC GRAVITY: 1.02
Tramadol Screen, Urine: NEGATIVE ng/mL

## 2022-12-21 LAB — NICOTINE SCREEN, URINE: Cotinine Ql Scrn, Ur: NEGATIVE ng/mL

## 2022-12-21 LAB — URINE CYTOLOGY ANCILLARY ONLY
Chlamydia: NEGATIVE
Comment: NEGATIVE
Comment: NORMAL
Neisseria Gonorrhea: NEGATIVE

## 2022-12-21 LAB — URINE CULTURE, OB REFLEX

## 2022-12-21 LAB — CULTURE, OB URINE

## 2022-12-21 NOTE — Progress Notes (Signed)
New Obstetric Patient H&P    Chief Complaint: "Desires prenatal care"   History of Present Illness: Patient is a 32 y.o. G2P1001 Not Hispanic or [place] female, LMP 09/13/22 presents with amenorrhea and positive home pregnancy test. Based on her  LMP, her EDD is Estimated Date of Delivery: 06/16/23 and her EGA is [redacted]w[redacted]d Cycles are irregular and patient does not closely track her cycles. She bleeds anywhere from 4-14 days with occasional ping-pong ball sized clots. Her periods begin light the first day or so then become heavier for the last few days before stopping. Her last pap smear was 3years ago and was ASCUS with NEGATIVE high risk HPV.    She had a urine pregnancy test which was positive 3 month(s)  ago. Her last menstrual period was normal, but she does not remember how long it lasted. Since her LMP she claims she has experienced 'not feeling like herself,' nausea, and breast tenderness. She admits 2 occasions of vaginal bleeding with small amounts of rust-colored blood in her underwear. Her past medical history is contibutory with a history of PCOS and MS. Her prior pregnancies are notable for preeclampsia.   Since her LMP, she admits to the use of tobacco products  no She claims she has gained 0 pounds since the start of her pregnancy.  There are cats in the home in the home  no. She admits close contact with children on a regular basis  yes  She has had chicken pox in the past yes She has had Tuberculosis exposures, symptoms, or previously tested positive for TB   no Current or past history of domestic violence. no  Genetic Screening/Teratology Counseling: (Includes patient, baby's father, or anyone in either family with:)   180 [facility] 2. Thalassemia ([place] or [place] background): MCV<80  no 3. Neural tube defect (meningomyelocele, spina bifida, anencephaly)  no 4. Congenital heart defect  no  5. Down syndrome  no 6. Tay-Sachs  (Jewish, [place]  no 7. Canavan's Disease  no 8. Sickle cell disease or trait (African)  no  9. Hemophilia or other blood disorders  no  10. Muscular dystrophy  no  11. Cystic fibrosis  no  12. Huntington's Chorea  no  13. Mental retardation/autism  no 14. Other inherited genetic or chromosomal disorder  no 15. Maternal metabolic disorder (DM, PKU, etc)  no 16. Patient or FOB with a child with a birth defect not listed above no  16a. Patient or FOB with a birth defect themselves no 17. Recurrent pregnancy loss, or stillbirth  no  18. Any medications since LMP other than prenatal vitamins (include vitamins, supplements, OTC meds, drugs, alcohol)  Plans to start medications for MS 19. Any other genetic/environmental exposure to discuss  no  Infection History:   1. Lives with someone with TB or TB exposed  no  2. Patient or partner has history of genital herpes  no 3. Rash or viral illness since LMP  no 4. History of STI (GC, CT, HPV, syphilis, HIV)  no 5. History of recent travel :  no  Other pertinent information:  no     Review of Systems:10 point review of systems negative unless otherwise noted in HPI  Past Medical History:  Past Medical History:  Diagnosis Date   ADD (attention deficit disorder) 05/28/2020   Allergy    MS (multiple sclerosis) (HCC)    Ovarian cyst  Preeclampsia 2018    Past Surgical History:  Past Surgical History:  Procedure Laterality Date   WISDOM TOOTH EXTRACTION     four;  teens    Gynecologic History: Patient's last menstrual period was 09/09/2022 (exact date).  Obstetric History: G2P1001  Family History:  Family History  Problem Relation Age of Onset   Depression Mother    Hypertension Mother    COPD Mother    Alcoholism Father    Hypertension Father    Breast cancer Maternal Grandmother    Depression Maternal Grandmother    Alzheimer's disease Maternal Grandmother    Lung cancer Maternal Grandfather    Breast cancer  Paternal Grandmother    Hypertension Paternal Grandmother    Glaucoma Paternal Grandmother    Dementia Paternal Grandmother    Skin cancer Paternal Grandfather    Parkinson's disease Paternal Grandfather    Depression Maternal Aunt    Hypertension Maternal Aunt    Lung cancer Paternal Uncle    Ovarian cancer Neg Hx    Colon cancer Neg Hx     Social History:  Social History   Socioeconomic History   Marital status: Married    Spouse name: Gerald Stabs   Number of children: 1   Years of education: 12   Highest education level: GED or equivalent  Occupational History   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, 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  Tobacco Use   Smoking status: Never   Smokeless tobacco: Never  Vaping Use   Vaping Use: Never used  Substance and Sexual Activity   Alcohol use: No   Drug use: No   Sexual activity: Yes    Partners: Male    Birth control/protection: None  Other Topics Concern   Not on file  Social History Narrative   Not on file   Social Determinants of Health   Financial Resource Strain: Low Risk  (11/02/2022)   Overall Financial Resource Strain (CARDIA)    Difficulty of Paying Living Expenses: Not very hard  Food Insecurity: No Food Insecurity (11/02/2022)   Hunger Vital Sign    Worried About Running Out of Food in the Last Year: Never true    Ran Out of Food in the Last Year: Never true  Transportation Needs: No Transportation Needs (11/02/2022)   PRAPARE - Hydrologist (Medical): No    Lack of Transportation (Non-Medical): No  Physical Activity: Sufficiently Active (11/02/2022)   Exercise Vital Sign    Days of Exercise per Week: 5 days    Minutes of Exercise per Session: 30 min  Stress: Stress Concern Present (11/02/2022)   [place]    Feeling of Stress : To some extent  Social Connections: Socially Integrated (11/02/2022)   Social Connection and  Isolation Panel [NHANES]    Frequency of Communication with Friends and Family: More than three times a week    Frequency of Social Gatherings with Friends and Family: More than three times a week    Attends Religious Services: More than 4 times per year    Active Member of [employer] or Organizations: Yes    Attends Music therapist: More than 4 times per year    Marital Status: Married  Human resources officer Violence: Not At Risk (11/02/2022)   Humiliation, Afraid, Rape, and Kick questionnaire    Fear of Current or Ex-Partner: No    Emotionally Abused: No    Physically Abused:  No    Sexually Abused: No    Allergies:  Allergies  Allergen Reactions   Aspirin Hives, Itching, Rash, Shortness Of Breath and Swelling   Penicillins Hives and Rash    Medications: Prior to Admission medications   Medication Sig Start Date End Date Taking? Authorizing Provider  Prenatal Vit-Fe Fumarate-FA (MULTIVITAMIN-PRENATAL) 27-0.8 MG TABS tablet Take 1 tablet by mouth daily at 12 noon.    [provider]    Physical Exam Vitals: Last menstrual period 09/09/2022.  General: NAD HEENT: normocephalic, anicteric Thyroid: no enlargement, no palpable nodules Pulmonary: No increased work of breathing, CTAB Cardiovascular: RRR, distal pulses 2+ Abdomen: NABS, soft, non-tender, non-distended.  Umbilicus without lesions.  No hepatomegaly, splenomegaly or masses palpable. No evidence of hernia  Genitourinary:  External: Normal external female genitalia.  Normal urethral meatus, normal  Bartholin's and Skene's glands.    Vagina: Normal vaginal mucosa, no evidence of prolapse.    Cervix: Grossly normal in appearance, no bleeding  Uterus: Non-enlarged, mobile, normal contour.  No CMT  Adnexa: ovaries non-enlarged, no adnexal masses  Rectal: deferred Extremities: no edema, erythema, or tenderness Neurologic: Grossly intact Psychiatric: mood appropriate, affect full   Assessment: 32 y.o.  G2P1001 at 18w5dpresenting to initiate prenatal care  Plan: 1) Avoid alcoholic beverages. 2) Patient encouraged not to smoke.  3) Discontinue the use of all non-medicinal drugs and chemicals.  4) Take prenatal vitamins daily.  5) Nutrition, food safety (fish, cheese advisories, and high nitrite foods) and exercise discussed. 6) Hospital and practice style discussed with cross coverage system.  7) Genetic Screening, such as with 1st Trimester Screening, cell free fetal DNA, AFP testing, and Ultrasound, as well as with amniocentesis and CVS as appropriate, is discussed with patient. At the conclusion of today's visit patient results reviewed genetic testing 8) Patient is asked about travel to areas at risk for the Zika virus, and counseled to avoid travel and exposure to mosquitoes or sexual partners who may have themselves been exposed to the virus. Testing is discussed, and will be ordered as appropriate.   Discussed recent diagnosis of MS and patient showed concern with diagnosis and treatment as it relates to her pregnancy. She did not feel that she had been given adequate education concerning the medications her primary doctor wanted her to start. Discussed a referral to MFM so she can get more information regarding MS and pregnancy and we discussed how MS patients often do very well with pregnancy. She also has a diagnosis of PCOS which causes irregular cycles and heavy bleeding for her. She was also told that it be unlikely that she could have a successful pregnancy due to the PCOS. She delivered her son vaginally and with minimal issues except for preeclampsia.  Patient tolerated pelvic and bimanual exam well.   Follow-up in approximately 4 weeks on 01/21/23 for ROB.  [address] [place] 12/21/2022 1:29 PM

## 2022-12-22 ENCOUNTER — Other Ambulatory Visit: Payer: Self-pay

## 2022-12-22 DIAGNOSIS — Z3689 Encounter for other specified antenatal screening: Secondary | ICD-10-CM

## 2022-12-22 DIAGNOSIS — G35 Multiple sclerosis: Secondary | ICD-10-CM

## 2022-12-22 LAB — CERVICOVAGINAL ANCILLARY ONLY
Bacterial Vaginitis (gardnerella): POSITIVE — AB
Chlamydia: NEGATIVE
Comment: NEGATIVE
Comment: NEGATIVE
Comment: NEGATIVE
Comment: NORMAL
Neisseria Gonorrhea: NEGATIVE
Trichomonas: NEGATIVE

## 2022-12-22 NOTE — Progress Notes (Signed)
Order placed for MFM Detail +14/required for MFM Consultation.

## 2022-12-22 NOTE — Addendum Note (Signed)
Addended by: Meryl Dare on: 12/22/2022 09:50 AM   Modules accepted: Orders

## 2022-12-23 ENCOUNTER — Encounter: Payer: Self-pay | Admitting: Obstetrics

## 2022-12-23 ENCOUNTER — Other Ambulatory Visit: Payer: Self-pay | Admitting: Obstetrics

## 2022-12-23 DIAGNOSIS — B9689 Other specified bacterial agents as the cause of diseases classified elsewhere: Secondary | ICD-10-CM

## 2022-12-23 MED ORDER — METRONIDAZOLE 500 MG PO TABS: 500.0000 mg | ORAL_TABLET | Freq: Two times a day (BID) | ORAL | 0 refills | Status: AC

## 2022-12-24 LAB — MATERNIT 21 PLUS CORE, BLOOD
Fetal Fraction: 10
Result (T21): NEGATIVE
Trisomy 13 (Patau syndrome): NEGATIVE
Trisomy 18 (Edwards syndrome): NEGATIVE
Trisomy 21 (Down syndrome): NEGATIVE

## 2022-12-24 LAB — CYTOLOGY - PAP
Diagnosis: NEGATIVE
Diagnosis: REACTIVE

## 2022-12-28 ENCOUNTER — Telehealth: Payer: Self-pay

## 2022-12-28 DIAGNOSIS — O99352 Diseases of the nervous system complicating pregnancy, second trimester: Secondary | ICD-10-CM

## 2022-12-28 NOTE — Telephone Encounter (Signed)
Unable to reach patient. Voicemail full. Will send my chart message.

## 2022-12-29 NOTE — Telephone Encounter (Signed)
Spoke with patient. She needs a referral to [facility]. She is unable to scheduled without referral. She states she has requested this from her primary a few times. She is having difficulty with excessive tired from pregnancy and MS, more so than previous pregnancy prior to [place]. Patient also states she received a notification from [facility] that a rx was ready for pick up. Advised the rx is for metroNIDAZOLE (FLAGYL) 500 MG tablet as her 12/21/22 Aptima swab was positive for BV. She hasn't read the letter Joycelyn Schmid sent. Advised will place referral.

## 2023-01-06 ENCOUNTER — Ambulatory Visit (INDEPENDENT_AMBULATORY_CARE_PROVIDER_SITE_OTHER): Payer: Medicaid Other

## 2023-01-06 ENCOUNTER — Other Ambulatory Visit: Payer: Self-pay | Admitting: Obstetrics

## 2023-01-06 VITALS — BP 102/70 | Ht 61.0 in | Wt 149.0 lb

## 2023-01-06 DIAGNOSIS — O26892 Other specified pregnancy related conditions, second trimester: Secondary | ICD-10-CM

## 2023-01-06 DIAGNOSIS — R300 Dysuria: Secondary | ICD-10-CM

## 2023-01-06 MED ORDER — PHENAZOPYRIDINE HCL 200 MG PO TABS: 200.0000 mg | ORAL_TABLET | Freq: Three times a day (TID) | ORAL | 1 refills | Status: DC | PRN

## 2023-01-06 MED ORDER — NITROFURANTOIN MONOHYD MACRO 100 MG PO CAPS: 100.0000 mg | ORAL_CAPSULE | Freq: Two times a day (BID) | ORAL | 0 refills | Status: AC

## 2023-01-06 NOTE — Progress Notes (Signed)
This patient came to the office complaining of severe dysuria, and urgency to void. Her symptoms  (per the MA report), started this morning. She took some AZO today, then came in for a Nurse visit. Meda Klinefelter, MA, reached out to this provider for assistance. I have ordered the patient some Pyridium and will start her on Macrobid. In the meantime, her urine is being sent off for culture. Shirlee Limerick, MAS will contact the patreint and let her know she can start on the medication.  Imagene Riches, CNM  01/06/2023 3:33 PM

## 2023-01-06 NOTE — Progress Notes (Signed)
    NURSE VISIT NOTE  Subjective:    Patient ID: Jessica Mcintosh, female    DOB: Mar 01, 1991, 32 y.o.   MRN: [id number]       HPI  Patient is a 32 y.o. G60P1001 female who presents for UTI symptoms. She is having urinary frequency and burning since yesterday. Has taken azo and given she took azo, I was unable to dip urine in office. Patient does not have a history of recurrent UTI's.  Patient does not have a history of pyelonephritis. Reached out to our on call provider, Gigi Gin, CNM for advise and she sent two prescriptions to pharmacy, Petrey and Pyridium. Will follow up with urine culture results.   Objective:    BP 102/70   Ht 5\' 1"  (1.549 m)   Wt 149 lb (67.6 kg)   LMP 09/09/2022 (Exact Date)   BMI 28.15 kg/m    Lab Review  No results found for any visits on 01/06/23.  Assessment:   1. Dysuria      Plan:    Urine Culture sent to lab. Treatment: Macrobid PO BID x 5 days, Pyridium PO TID daily PRN.  Drenda Freeze, CMA

## 2023-01-06 NOTE — Patient Instructions (Signed)
Pregnancy and Urinary Tract Infection ? ?A urinary tract infection (UTI) is an infection of any part of the urinary tract. This includes the kidneys, the tubes that connect the kidneys to the bladder (ureters), the bladder, and the tube that carries urine out of the body (urethra). These organs make, store, and get rid of urine in the body. Your health care provider may use other names to describe the infection. An upper UTI affects the ureters and kidneys (pyelonephritis). A lower UTI affects the bladder (cystitis) and urethra (urethritis). ?Most UTIs are caused by bacteria in the genital area, around the entrance to the urinary tract. These bacteria grow and cause irritation and inflammation of the urinary tract. You are more likely to develop a UTI during pregnancy because: ?The physical and hormonal changes that your body goes through make it easier for bacteria to get into your urinary tract. ?Your growing baby puts pressure on your bladder and can affect urine flow. ?Pregnant women with diabetes are at an increased risk for developing a UTI. It is important to recognize and treat UTIs in pregnancy because they can cause serious complications for both you and your baby. ?How does this affect me? ?Symptoms of a UTI include: ?Needing to urinate right away (urgently) and often, even if urinating a small amount. ?Pain, burning, or having a hard time passing urine. ?Blood in the urine. ?Unusual, cloudy, and bad-smelling urine. ?Pain in the abdomen or lower back. ?Vaginal discharge. ?You may also have: ?Vomiting or a decreased appetite. ?Confusion. ?Irritability or tiredness. ?A fever. ?Diarrhea. ?A low level of red blood cells (anemia). ?The development of high blood pressure during pregnancy (preeclampsia). ?How does this affect my baby? ?An untreated UTI during pregnancy could lead to a kidney infection or an infection throughout the mother's body (systemic infection). This can cause health problems and affect the  baby. Possible complications of an untreated UTI include: ?Your baby being born before 37 weeks of pregnancy (premature). ?Your baby being born with a low birth weight. ?Your baby having a higher risk of having his or her skin or the white parts of the eyes turn yellow (jaundice). ?What can I do to lower my risk? ?To prevent a UTI: ?Do not hold urine for long periods of time. Empty your bladder as soon as you feel the urge. ?Always wipe from front to back, especially after a bowel movement. Use each tissue one time when you wipe. ?Empty your bladder after sex. ?Keep your genital area dry. ?Drink 6 to 8 glasses of water each day. ?Do not douche or use deodorant sprays. ?Wear cotton underwear and loose clothing. ?How is this treated? ?Treatment for this condition may include: ?Antibiotic medicines that are safe to take during pregnancy. ?Other medicines to treat less common causes of UTI. ?Follow these instructions at home: ?If you were prescribed an antibiotic medicine, take it as told by your health care provider. Do not stop using the antibiotic even if you start to feel better. ?Keep all follow-up visits. This is important. ?Contact a health care provider if: ?Your symptoms do not improve or they get worse. ?You have abnormal vaginal discharge. ?Get help right away if you: ?Have a fever. ?Have nausea and vomiting. ?Have back or side pain. ?Have lower belly pain, tightness, or feel contractions in your uterus. ?Have a gush of fluid from your vagina. ?Have blood in your urine. ?Summary ?A UTI is an infection of any part of the urinary tract, which includes the kidneys, ureters, bladder,   and urethra. ?Most urinary tract infections are caused by bacteria in your genital area, around the entrance to your urinary tract (urethra). ?You are more likely to develop a UTI during pregnancy. It is important to recognize and treat UTIs in pregnancy because of the risk of serious complications for both you and your baby. ?If you  were prescribed an antibiotic medicine, take it as told by your health care provider. Do not stop using the antibiotic even if you start to feel better. ?This information is not intended to replace advice given to you by your health care provider. Make sure you discuss any questions you have with your health care provider. ?Document Revised: 05/21/2021 Document Reviewed: 05/21/2021 ?Elsevier Patient Education ? 2023 Elsevier Inc. ? ?

## 2023-01-07 ENCOUNTER — Ambulatory Visit: Payer: Medicaid Other

## 2023-01-09 LAB — URINE CULTURE

## 2023-01-11 NOTE — Progress Notes (Signed)
Positive Ecoli UTI, medication has been started. Patient viewed results via [place].

## 2023-01-13 ENCOUNTER — Ambulatory Visit: Payer: Medicaid Other

## 2023-01-13 ENCOUNTER — Ambulatory Visit
Admission: RE | Admit: 2023-01-13 | Discharge: 2023-01-13 | Disposition: A | Discharge status: Home / Self Care | Payer: Medicaid Other | Source: Ambulatory Visit | Attending: Obstetrics | Admitting: Obstetrics

## 2023-01-13 DIAGNOSIS — Z3A18 18 weeks gestation of pregnancy: Secondary | ICD-10-CM | POA: Insufficient documentation

## 2023-01-13 DIAGNOSIS — Z3689 Encounter for other specified antenatal screening: Secondary | ICD-10-CM | POA: Insufficient documentation

## 2023-01-13 DIAGNOSIS — Z3480 Encounter for supervision of other normal pregnancy, unspecified trimester: Secondary | ICD-10-CM | POA: Diagnosis present

## 2023-01-20 ENCOUNTER — Encounter: Payer: Self-pay | Admitting: Obstetrics

## 2023-01-20 ENCOUNTER — Ambulatory Visit (INDEPENDENT_AMBULATORY_CARE_PROVIDER_SITE_OTHER): Payer: Medicaid Other | Admitting: Obstetrics

## 2023-01-20 VITALS — BP 115/63 | HR 84 | Wt 156.4 lb

## 2023-01-20 DIAGNOSIS — Z3A19 19 weeks gestation of pregnancy: Secondary | ICD-10-CM

## 2023-01-20 DIAGNOSIS — Z3480 Encounter for supervision of other normal pregnancy, unspecified trimester: Secondary | ICD-10-CM

## 2023-01-20 DIAGNOSIS — Z3482 Encounter for supervision of other normal pregnancy, second trimester: Secondary | ICD-10-CM

## 2023-01-20 LAB — POCT URINALYSIS DIPSTICK OB
Bilirubin, UA: NEGATIVE
Blood, UA: NEGATIVE
Glucose, UA: NEGATIVE
Ketones, UA: NEGATIVE
Leukocytes, UA: NEGATIVE
Nitrite, UA: NEGATIVE
POC,PROTEIN,UA: NEGATIVE
Spec Grav, UA: 1.02 (ref 1.010–1.025)
Urobilinogen, UA: 0.2 E.U./dL
pH, UA: 5 (ref 5.0–8.0)

## 2023-01-20 NOTE — Progress Notes (Signed)
Routine Prenatal Care Visit  Hingham is a 32 y.o. G2P1001 at [redacted]w[redacted]d being seen today for ongoing prenatal care.  She is currently monitored for the following issues for this high-risk pregnancy and has History of gestational hypertension; Dysmenorrhea; Tenderness of female pelvic organs; Vitamin D deficiency; Fatigue; Weight gain; Body mass index (BMI) of 31.0-31.9 in adult; Screening for blood or protein in urine; Acute focal neurological deficit; Right leg weakness; Demyelinating lesion; Multiple sclerosis; Numbness and tingling of right lower extremity; High risk medication use; and Supervision of other normal pregnancy, antepartum on their problem list.  ----------------------------------------------------------------------------------- Patient reports no contractions, no cramping, no leaking, and she thinks she is feeling flutters. .she was recently treated for dysuria with Macrobid, but her urine culture showed only low levels of E coli. She does have MS, and has an appt with MFM to discuss this diagnosis as it relates to her pregnancy  thinking about the name [place] for her baby. Contractions: Not present. Vag. Bleeding: None.  Movement: Present. Leaking Fluid denies.  ----------------------------------------------------------------------------------- The following portions of the patient's history were reviewed and updated as appropriate: allergies, current medications, past family history, past medical history, past social history, past surgical history and problem list. Problem list updated.  Objective  Blood pressure 115/63, pulse 84, weight 156 lb 6.4 oz (70.9 kg), last menstrual period 09/09/2022. Pregravid weight 148 lb (67.1 kg) Total Weight Gain 8 lb 6.4 oz (3.81 kg) Urinalysis: Urine Protein Negative  Urine Glucose Negative  Fetal Status:     Movement: Present     General:  Alert, oriented and cooperative. Patient is in no acute distress.  Skin: Skin is warm  and dry. No rash noted.   Cardiovascular: Normal heart rate noted  Respiratory: Normal respiratory effort, no problems with respiration noted  Abdomen: Soft, gravid, appropriate for gestational age. Pain/Pressure: Present     Pelvic:  Cervical exam deferred        Extremities: Normal range of motion.  Edema: Trace  Mental Status: Normal mood and affect. Normal behavior. Normal judgment and thought content.   Assessment   32 y.o. G2P1001 at [redacted]w[redacted]d by  06/16/2023, by Last Menstrual Period presenting for routine prenatal visit  Plan   second Problems (from 11/02/22 to present)     Problem Noted Resolved   Supervision of other normal pregnancy, antepartum 11/02/2022 by Cleophas Dunker, CMA No   Overview Addendum 01/20/2023  9:05 AM by Imagene Riches, CNM     Clinical Staff Provider  Office Location  [facility] Dating  Not found.  Language  English Anatomy US    Flu Vaccine  offer Genetic Screen  NIPS:   TDaP vaccine   offer Hgb A1C or  GTT Early : Third trimester :   Covid declined   LAB RESULTS   Rhogam  O/Positive/-- (03/01 0901)  Blood Type O/Positive/-- (03/01 0901)   Feeding Plan breast Antibody Negative (03/01 0901)  Contraception undecided Rubella 2.58 (03/01 0901)  Circumcision yes RPR Non Reactive (03/01 0901)   Pediatrician  [facility] in [place] HBsAg Negative (03/01 0901)   Support Person Gerald Stabs HIV Non Reactive (03/01 0901)  Prenatal Classes yes Varicella     GBS  (For PCN allergy, check sensitivities)   BTL Consent  Hep C Non Reactive (03/01 0901)   VBAC Consent  Pap Diagnosis  Date Value Ref Range Status  12/21/2022   Final   - Negative for Intraepithelial Lesions or Malignancy (NILM)  12/21/2022 -  Benign reactive/reparative changes  Final      Hgb Electro      CF      SMA                   Preterm labor symptoms and general obstetric precautions including but not limited to vaginal bleeding, contractions, leaking of fluid and fetal movement were  reviewed in detail with the patient. Please refer to After Visit Summary for other counseling recommendations.  We reivewed her anatomy ultrasound.  Return in about 4 weeks (around 02/17/2023) for return OB.  Imagene Riches, CNM  01/20/2023 9:06 AM

## 2023-01-25 ENCOUNTER — Institutional Professional Consult (permissible substitution): Payer: Medicaid Other

## 2023-01-25 ENCOUNTER — Ambulatory Visit: Payer: Medicaid Other

## 2023-02-17 ENCOUNTER — Ambulatory Visit (INDEPENDENT_AMBULATORY_CARE_PROVIDER_SITE_OTHER): Payer: Medicaid Other | Admitting: Obstetrics and Gynecology

## 2023-02-17 ENCOUNTER — Encounter: Payer: Self-pay | Admitting: Obstetrics and Gynecology

## 2023-02-17 VITALS — BP 104/69 | HR 78 | Wt 162.1 lb

## 2023-02-17 DIAGNOSIS — Z113 Encounter for screening for infections with a predominantly sexual mode of transmission: Secondary | ICD-10-CM

## 2023-02-17 DIAGNOSIS — Z3480 Encounter for supervision of other normal pregnancy, unspecified trimester: Secondary | ICD-10-CM

## 2023-02-17 DIAGNOSIS — Z131 Encounter for screening for diabetes mellitus: Secondary | ICD-10-CM

## 2023-02-17 DIAGNOSIS — Z3482 Encounter for supervision of other normal pregnancy, second trimester: Secondary | ICD-10-CM

## 2023-02-17 DIAGNOSIS — Z8759 Personal history of other complications of pregnancy, childbirth and the puerperium: Secondary | ICD-10-CM

## 2023-02-17 DIAGNOSIS — Z3A23 23 weeks gestation of pregnancy: Secondary | ICD-10-CM

## 2023-02-17 LAB — POCT URINALYSIS DIPSTICK OB
Bilirubin, UA: NEGATIVE
Blood, UA: NEGATIVE
Glucose, UA: NEGATIVE
Ketones, UA: NEGATIVE
Nitrite, UA: NEGATIVE
Spec Grav, UA: 1.01 (ref 1.010–1.025)
Urobilinogen, UA: 0.2 E.U./dL
pH, UA: 7 (ref 5.0–8.0)

## 2023-02-17 NOTE — Progress Notes (Signed)
ROB: She says she is doing well.  Occasionally gets tired.  Has some mild cramping but believes this is normal for her.  Baseline preeclampsia labs done today.  Patient not taking aspirin because she says she is "allergic".  Anatomy scan complete. 1 hour GCT next visit.  MFM appointment for MS scheduled.

## 2023-02-17 NOTE — Progress Notes (Signed)
ROB. Patient states fetal movement and occasional side cramping. Normal anatomy ultrasound. She is scheduled to see MFM 02/25/23 due to MS.

## 2023-02-18 LAB — PROTEIN / CREATININE RATIO, URINE
Creatinine, Urine: 51.5 mg/dL
Protein, Ur: 7.9 mg/dL
Protein/Creat Ratio: 153 mg/g creat (ref 0–200)

## 2023-02-24 DIAGNOSIS — O0990 Supervision of high risk pregnancy, unspecified, unspecified trimester: Secondary | ICD-10-CM | POA: Insufficient documentation

## 2023-02-24 DIAGNOSIS — Z8742 Personal history of other diseases of the female genital tract: Secondary | ICD-10-CM | POA: Insufficient documentation

## 2023-02-25 ENCOUNTER — Ambulatory Visit: Payer: Medicaid Other | Attending: Obstetrics

## 2023-02-25 ENCOUNTER — Ambulatory Visit: Payer: Medicaid Other

## 2023-02-25 ENCOUNTER — Other Ambulatory Visit: Payer: Self-pay | Admitting: *Deleted

## 2023-02-25 ENCOUNTER — Ambulatory Visit (HOSPITAL_BASED_OUTPATIENT_CLINIC_OR_DEPARTMENT_OTHER): Payer: Medicaid Other | Admitting: Obstetrics and Gynecology

## 2023-02-25 ENCOUNTER — Encounter: Payer: Self-pay | Admitting: *Deleted

## 2023-02-25 ENCOUNTER — Ambulatory Visit: Payer: Medicaid Other | Admitting: *Deleted

## 2023-02-25 VITALS — BP 126/66 | HR 88

## 2023-02-25 DIAGNOSIS — O0990 Supervision of high risk pregnancy, unspecified, unspecified trimester: Secondary | ICD-10-CM

## 2023-02-25 DIAGNOSIS — Z8742 Personal history of other diseases of the female genital tract: Secondary | ICD-10-CM

## 2023-02-25 DIAGNOSIS — G35 Multiple sclerosis: Secondary | ICD-10-CM | POA: Diagnosis not present

## 2023-02-25 DIAGNOSIS — O09292 Supervision of pregnancy with other poor reproductive or obstetric history, second trimester: Secondary | ICD-10-CM | POA: Diagnosis not present

## 2023-02-25 DIAGNOSIS — Z3689 Encounter for other specified antenatal screening: Secondary | ICD-10-CM | POA: Insufficient documentation

## 2023-02-25 DIAGNOSIS — O99352 Diseases of the nervous system complicating pregnancy, second trimester: Secondary | ICD-10-CM | POA: Insufficient documentation

## 2023-02-25 DIAGNOSIS — Z3A24 24 weeks gestation of pregnancy: Secondary | ICD-10-CM

## 2023-02-25 NOTE — Progress Notes (Signed)
Maternal-Fetal Medicine   Name: Jessica Mcintosh DOB: 08/12/1991 MRN: [id number] Referring Provider: Brennan Bailey, MD  I had the pleasure of seeing Ms. [facility] at [facility]. She is G2 P1001 at 64-weeks' gestation and is here for fetal anatomy scan and consultation.  Past medical history significant for multiple sclerosis diagnosed in August 2022 when patient had tingling and numbness in in her right Mcintosh.  Subsequently, she was evaluated by Dr. Epimenio Mcintosh, her neurologist.  Brain MRI confirmed multiple sclerosis.  Patient was advised to take dimethyl fumarate (Tecfidera).  Since diagnosis, the patient has not started any medical treatment and feels her multiple sclerosis is stable with vitamin supplements. She has not had neurology follow-up since her first visit.  No visual disturbances or shortness of breath.  She does not have hypertension or diabetes or any other chronic medical conditions. Past surgical history: Nil of note. Medications: Prenatal vitamins. Allergies: Penicillin (hives), aspirin (hives). Social history: Denies tobacco or drug or alcohol use. GYN history: No history of abnormal Pap smear or cervical surgeries. Obstetrical history significant for a term vaginal delivery in 2018 of a female infant weighing 7 pounds and 4 ounces at birth.  Her pregnancy and delivery were uncomplicated.  Prenatal course: On cell-free fetal DNA screening, the risks of fetal aneuploidies are not increased.  Patient had UTI in this pregnancy that was treated.  Ultrasound We performed a fetal anatomical survey.  Fetal biometry is consistent with the previously established age.  Amniotic fluid is normal and good fetal activity seen.  No markers of aneuploidies or fetal structural defects are seen.   Multiple sclerosis (MS) in pregnancy - Pregnancy seems to confer some protection in reducing relapse rates (PRIMS study) and there is a significant reduction in relapse in the third  trimester. About 25% of women have postpartum relapses are more common. -Pregnancy does NOT influence long-term course (disability outcomes) of MS. Pregnancy also does not affect the type or severity of exacerbation.   -MS does not increase adverse pregnancy outcomes including preterm delivery, preeclampsia, or fetal growth restriction. Patient does not have bladder symptoms that are likely to be exacerbated in pregnancy in some women. -Pregnancy is not considered high risk if MS is stable and if she is not taking disease-modifying drugs. -Vitamin D supplements are recommended (if patient has vitamin D deficiency).  -I reassured the patient that MRI in pregnancy is not contraindicated. Gadolinium dye is avoided but may be given if MRI is necessary for diagnosis.  -Prenatal and obstetric management is not influenced by MS and routine care should continue. Vaginal delivery is the goal and cesarean section should be performed only for obstetric indications.  -Epidural analgesia may be safely given. In severe cases with spasticity, epidural analgesia may be helpful. Anesthesiologist may be informed in advance if induction of labor is planned.  Treatment of MS -Disease-modifying drugs are the mainstay of treatment of MS. Copaxone (glatiramer acetate or GA) has been approved for use in pregnant women (category B) and no increased adverse effects are reported. Other drugs include Interferon-beta (IFN-B) and natalizumab (Tysabri).   -In general, pregnant women should not be deprived of treatment because of pregnancy concerns. Falling relapse rates in pregnancy does not mean that long-term progression is in pause.   -IFN-B and GA can be given in pregnancy and no increased congenital malformations are reported. Our patient is 21-weeks' pregnant and is unlikely to have increased congenital malformations. Benefits of breastfeeding while on treatment with these  drugs seem to outweigh risks.  -Natalizumab  (Tysabri) is considered in patients with severe MS. If considered for our patient, it can be continued in pregnancy and preferably stopped at 34 weeks for about 8 weeks (till after delivery). Self-limiting hematological abnormalities are reported in some newborns.  Methylprednisone can be safely given in pregnancy if severe exacerbation requires steroid treatment.  I encouraged the patient to make an appointment with her neurologist to discuss MS and treatment. Although she will experience remission during pregnancy, treatment should NOT be deferred because of her pregnancy.  Patient is very firm in not starting treatment for MS in this pregnancy but would like to make an appointment with her neurologist. She informed that she would have MRI if advised.  (Reference on treatment: [place] Consensus on pregnancy with multiple sclerosis. Pract Neurol 2019;19:106-114).  Recommendations -An appointment was made for her to return in 4 weeks for fetal growth assessment. -Fetal growth assessments every 4 weeks till delivery. -Neurology appointment. -Anesthesiology consultation in third trimester. -At today's consultation, patient clearly stated that she does not want medical treatment for multiple sclerosis in this pregnancy.  Thank you for consultation.  If you have any questions or concerns, please contact me the [facility].  Consultation including face-to-face (more than 50%) counseling 30 minutes.

## 2023-03-17 ENCOUNTER — Other Ambulatory Visit: Payer: Medicaid Other

## 2023-03-17 ENCOUNTER — Ambulatory Visit (INDEPENDENT_AMBULATORY_CARE_PROVIDER_SITE_OTHER): Payer: Medicaid Other

## 2023-03-17 VITALS — BP 118/67 | HR 85 | Wt 165.7 lb

## 2023-03-17 DIAGNOSIS — Z3480 Encounter for supervision of other normal pregnancy, unspecified trimester: Secondary | ICD-10-CM | POA: Insufficient documentation

## 2023-03-17 DIAGNOSIS — Z131 Encounter for screening for diabetes mellitus: Secondary | ICD-10-CM

## 2023-03-17 DIAGNOSIS — Z23 Encounter for immunization: Secondary | ICD-10-CM

## 2023-03-17 DIAGNOSIS — O09292 Supervision of pregnancy with other poor reproductive or obstetric history, second trimester: Secondary | ICD-10-CM

## 2023-03-17 DIAGNOSIS — G35 Multiple sclerosis: Secondary | ICD-10-CM

## 2023-03-17 DIAGNOSIS — O99352 Diseases of the nervous system complicating pregnancy, second trimester: Secondary | ICD-10-CM

## 2023-03-17 DIAGNOSIS — Z3A27 27 weeks gestation of pregnancy: Secondary | ICD-10-CM

## 2023-03-17 DIAGNOSIS — R519 Headache, unspecified: Secondary | ICD-10-CM

## 2023-03-17 DIAGNOSIS — O99891 Other specified diseases and conditions complicating pregnancy: Secondary | ICD-10-CM

## 2023-03-17 DIAGNOSIS — Z113 Encounter for screening for infections with a predominantly sexual mode of transmission: Secondary | ICD-10-CM

## 2023-03-17 HISTORY — DX: 27 weeks gestation of pregnancy: Z3A.27

## 2023-03-17 LAB — POCT URINALYSIS DIPSTICK OB
Bilirubin, UA: NEGATIVE
Blood, UA: NEGATIVE
Glucose, UA: NEGATIVE
Ketones, UA: NEGATIVE
Nitrite, UA: NEGATIVE
Spec Grav, UA: 1.01 (ref 1.010–1.025)
Urobilinogen, UA: 0.2 E.U./dL
pH, UA: 6 (ref 5.0–8.0)

## 2023-03-17 NOTE — Progress Notes (Signed)
Routine Prenatal Care Visit  Subjective  Jessica Mcintosh is a 32 y.o. G2P1001 at [redacted]w[redacted]d being seen today for ongoing prenatal care.  She is currently monitored for the following issues for this high-risk pregnancy and has History of gestational hypertension; Dysmenorrhea; Right leg weakness; Demyelinating lesion (HCC); Multiple sclerosis (HCC); Supervision of high risk pregnancy, antepartum; History of PCOS; Supervision of other normal pregnancy, antepartum; [redacted] weeks gestation of pregnancy; and Frequent headaches on their problem list.  ----------------------------------------------------------------------------------- Patient reports  increased swelling in hands and feet as well as increase in intermittent headaches with spotting in vision although she is not experiencing this at today's visit. She reports occasionally using Tylenol when headaches are bad which provides some relief  .   Contractions: Not present. Vag. Bleeding: None.  Movement: Present. Leaking Fluid denies.  ----------------------------------------------------------------------------------- The following portions of the patient's history were reviewed and updated as appropriate: allergies, current medications, past family history, past medical history, past social history, past surgical history and problem list. Problem list updated.  Objective  Blood pressure 118/67, pulse 85, weight 165 lb 11.2 oz (75.2 kg), last menstrual period 09/09/2022. Pregravid weight 148 lb (67.1 kg) Total Weight Gain 17 lb 11.2 oz (8.029 kg) Urinalysis: Urine Protein Trace  Urine Glucose Negative  Fetal Status: Fetal Heart Rate (bpm): 125 Fundal Height: 27 cm Movement: Present  Presentation: Vertex  General:  Alert, oriented and cooperative. Patient is in no acute distress.  Skin: Skin is warm and dry. No rash noted.   Cardiovascular: Normal heart rate noted  Respiratory: Normal respiratory effort, no problems with respiration noted  Abdomen:  Soft, gravid, appropriate for gestational age. Pain/Pressure: Present     Pelvic:  Cervical exam deferred        Extremities: Normal range of motion.  Edema: Mild pitting, slight indentation  Mental Status: Normal mood and affect. Normal behavior. Normal judgment and thought content.   Assessment   32 y.o. G2P1001 at [redacted]w[redacted]d by  06/16/2023, by Last Menstrual Period presenting for routine prenatal visit.  Doing well overall, having some increased swelling in hands and feet and increased headaches for which Tylenol provides some relief. She is also taking magnesium daily to help with cramps.   She has been seen by MFM for a consult regarding her multiple sclerosis diagnosis. Their recommendation is to continue with routine prenatal care with growth ultrasounds every 4 weeks until delivery and an anesthesiology consult in the third trimester. Medical treatment for MS offered and declined by patient.    Plan   Lab Orders         Comprehensive metabolic panel         Protein / creatinine ratio, urine         POC Urinalysis Dipstick OB      Preterm labor symptoms and general obstetric precautions including but not limited to vaginal bleeding, contractions, leaking of fluid and fetal movement were reviewed in detail with the patient.   History of Gestational Hypertension - CMP, CBC, and UP:C ratio collected today given history and symptoms.  - Reviewed preeclampsia precautions including severe headache, changes in vision, RUQ pain.   Headaches - Recommended hydration and use of Tylenol for headaches. Currently taking 400 mg of magnesium nightly. Discussed increasing to [phone] mg to help with headache prevention  Multiple Sclerosis - Follow up growth ultrasound scheduled. - Needs anesthesia consult. Referral placed.  Recommended  Please refer to After Visit Summary for other counseling recommendations.   Return in about  2 weeks (around 03/31/2023) for ROB.  Autumn Messing, CNM 02/2923 1035  AM

## 2023-03-18 LAB — 28 WEEK RH+PANEL
Basophils Absolute: 0 10*3/uL (ref 0.0–0.2)
Basos: 0 %
EOS (ABSOLUTE): 0.1 10*3/uL (ref 0.0–0.4)
Eos: 1 %
Gestational Diabetes Screen: 83 mg/dL (ref 70–139)
HIV Screen 4th Generation wRfx: NONREACTIVE
Hematocrit: 35.3 % (ref 34.0–46.6)
Hemoglobin: 11.4 g/dL (ref 11.1–15.9)
Immature Grans (Abs): 0.2 10*3/uL — ABNORMAL HIGH (ref 0.0–0.1)
Immature Granulocytes: 2 %
Lymphocytes Absolute: 1.5 10*3/uL (ref 0.7–3.1)
Lymphs: 13 %
MCH: 29.5 pg (ref 26.6–33.0)
MCHC: 32.3 g/dL (ref 31.5–35.7)
MCV: 92 fL (ref 79–97)
Monocytes Absolute: 0.8 10*3/uL (ref 0.1–0.9)
Monocytes: 7 %
Neutrophils Absolute: 8.4 10*3/uL — ABNORMAL HIGH (ref 1.4–7.0)
Neutrophils: 77 %
Platelets: 255 10*3/uL (ref 150–450)
RBC: 3.86 x10E6/uL (ref 3.77–5.28)
RDW: 12 % (ref 11.7–15.4)
RPR Ser Ql: NONREACTIVE
WBC: 10.9 10*3/uL — ABNORMAL HIGH (ref 3.4–10.8)

## 2023-03-18 LAB — PROTEIN / CREATININE RATIO, URINE
Creatinine, Urine: 62.2 mg/dL
Protein, Ur: 11.7 mg/dL
Protein/Creat Ratio: 188 mg/g creat (ref 0–200)

## 2023-03-19 LAB — COMPREHENSIVE METABOLIC PANEL
ALT: 12 IU/L (ref 0–32)
AST: 19 IU/L (ref 0–40)
Albumin/Globulin Ratio: 1.3 (ref 1.2–2.2)
Albumin: 3.1 g/dL — ABNORMAL LOW (ref 4.0–5.0)
Alkaline Phosphatase: 114 IU/L (ref 44–121)
BUN/Creatinine Ratio: 10 (ref 9–23)
BUN: 5 mg/dL — ABNORMAL LOW (ref 6–20)
Bilirubin Total: <0.2 mg/dL (ref 0.0–1.2)
CO2: 17 mmol/L — ABNORMAL LOW (ref 20–29)
Calcium: 8.3 mg/dL — ABNORMAL LOW (ref 8.7–10.2)
Chloride: 104 mmol/L (ref 96–106)
Creatinine, Ser: 0.5 mg/dL — ABNORMAL LOW (ref 0.57–1.00)
Globulin, Total: 2.4 g/dL (ref 1.5–4.5)
Glucose: 74 mg/dL (ref 70–99)
Potassium: 3.4 mmol/L — ABNORMAL LOW (ref 3.5–5.2)
Sodium: 138 mmol/L (ref 134–144)
Total Protein: 5.5 g/dL — ABNORMAL LOW (ref 6.0–8.5)
eGFR: 129 mL/min/{1.73_m2} (ref >59)

## 2023-03-19 LAB — SPECIMEN STATUS REPORT

## 2023-03-24 ENCOUNTER — Encounter
Admission: RE | Admit: 2023-03-24 | Discharge: 2023-03-24 | Disposition: A | Discharge status: Home / Self Care | Payer: Medicaid Other | Source: Ambulatory Visit

## 2023-03-24 NOTE — Consult Note (Signed)
Anesthesia Consult:  32 yo G2P1001 here to discussed labor anesthesia in light of having multiple sclerosis. Pt reports having no acting neurological deficits. She has successfully had a an epidural. Chart review indicates patient has had previous R foot numbness and tingling. MRI from 2022 indicated active disease.The use of an epidural for labor anesthesia and for any operation such as cesarean section can be performed  without contraindication. The risk of multiple sclerosis exacerbation can be elevated with the use of spinal. Therefore this will be avoided. There ARE NO further steps required prior to labor such  as a neurology consult or imaging.   Nelta Numbers MD ANES

## 2023-04-01 ENCOUNTER — Encounter: Payer: Medicaid Other | Admitting: Certified Nurse Midwife

## 2023-04-07 ENCOUNTER — Ambulatory Visit: Payer: Medicaid Other | Attending: Obstetrics and Gynecology

## 2023-04-15 ENCOUNTER — Other Ambulatory Visit (HOSPITAL_COMMUNITY)
Admission: RE | Admit: 2023-04-15 | Discharge: 2023-04-15 | Disposition: A | Discharge status: Home / Self Care | Payer: Medicaid Other | Source: Ambulatory Visit | Attending: Licensed Practical Nurse | Admitting: Licensed Practical Nurse

## 2023-04-15 ENCOUNTER — Ambulatory Visit (INDEPENDENT_AMBULATORY_CARE_PROVIDER_SITE_OTHER): Payer: Medicaid Other | Admitting: Licensed Practical Nurse

## 2023-04-15 ENCOUNTER — Encounter: Payer: Self-pay | Admitting: Licensed Practical Nurse

## 2023-04-15 VITALS — BP 122/80 | HR 100 | Wt 183.6 lb

## 2023-04-15 DIAGNOSIS — Z3483 Encounter for supervision of other normal pregnancy, third trimester: Secondary | ICD-10-CM

## 2023-04-15 DIAGNOSIS — O0990 Supervision of high risk pregnancy, unspecified, unspecified trimester: Secondary | ICD-10-CM | POA: Diagnosis present

## 2023-04-15 DIAGNOSIS — N898 Other specified noninflammatory disorders of vagina: Secondary | ICD-10-CM | POA: Diagnosis present

## 2023-04-15 DIAGNOSIS — Z3A31 31 weeks gestation of pregnancy: Secondary | ICD-10-CM

## 2023-04-15 LAB — POCT URINALYSIS DIPSTICK
Bilirubin, UA: NEGATIVE
Blood, UA: NEGATIVE
Glucose, UA: NEGATIVE
Ketones, UA: NEGATIVE
Leukocytes, UA: NEGATIVE
Nitrite, UA: NEGATIVE
Protein, UA: POSITIVE — AB
Spec Grav, UA: 1.01 (ref 1.010–1.025)
Urobilinogen, UA: 0.2 E.U./dL
pH, UA: 6 (ref 5.0–8.0)

## 2023-04-15 NOTE — Progress Notes (Signed)
Routine Prenatal Care Visit  Subjective  Jessica Mcintosh is a 32 y.o. G2P1001 at [redacted]w[redacted]d being seen today for ongoing prenatal care.  She is currently monitored for the following issues for this high-risk pregnancy and has History of gestational hypertension; Dysmenorrhea; Right leg weakness; Demyelinating lesion (HCC); Multiple sclerosis (HCC); Supervision of high risk pregnancy, antepartum; History of PCOS; Supervision of other normal pregnancy, antepartum; [redacted] weeks gestation of pregnancy; and Frequent headaches on their problem list.  ----------------------------------------------------------------------------------- Patient reports headache.  Has been getting headaches for the most of pregnancy, they are increasing in frequency and intensity. They occur on her right temple or over the front of her head. She occasionally takes 2 Tylenol for them. She is taking Magnesium daily.  She last took Tylenol last night. She has noticed swelling in her legs, she checked her Blood pressure at home and it was around 131/90. Her BP today is WNL, Has a mild headache now, but feels it is related to managing her kids, they are with her today and not sitting still. Reviewed her hx of preeclampsia with her son requiring early delivery. Pt strongly desires a spontaneous labor at term. Pt will go home take 2 Tylenol, Benadryl and a caffeinated  beverage.  PIH labs drawn 5/29 WNL.  -missed MFM apt-she was not available on that scheduled date, will call to reschedule  -has noticed increased watery discharge with an odor, declines exam, self swab collected    Contractions: Not present. Vag. Bleeding: None.  Movement: Present. Leaking Fluid denies.  ----------------------------------------------------------------------------------- The following portions of the patient's history were reviewed and updated as appropriate: allergies, current medications, past family history, past medical history, past social history, past  surgical history and problem list. Problem list updated.  Objective  Blood pressure 122/80, pulse 100, weight 183 lb 9.6 oz (83.3 kg), last menstrual period 09/09/2022. Pregravid weight 148 lb (67.1 kg) Total Weight Gain 35 lb 9.6 oz (16.1 kg) Urinalysis: Urine Protein    Urine Glucose    Fetal Status: Fetal Heart Rate (bpm): 120 Fundal Height: 33 cm Movement: Present     General:  Alert, oriented and cooperative. Patient is in no acute distress.  Skin: Skin is warm and dry. No rash noted.   Cardiovascular: Normal heart rate noted  Respiratory: Normal respiratory effort, no problems with respiration noted  Abdomen: Soft, gravid, appropriate for gestational age. Pain/Pressure: Present     Pelvic:  Cervical exam deferred        Extremities: Normal range of motion.  Edema: Mild pitting, slight indentation  Mental Status: Normal mood and affect. Normal behavior. Normal judgment and thought content.   Assessment   31 y.o. G2P1001 at [redacted]w[redacted]d by  06/16/2023, by Last Menstrual Period presenting for routine prenatal visit  Plan   second Problems (from 11/02/22 to present)     No problems associated with this episode.        Preterm labor symptoms and general obstetric precautions including but not limited to vaginal bleeding, contractions, leaking of fluid and fetal movement were reviewed in detail with the patient. Please refer to After Visit Summary for other counseling recommendations.   Return in about 2 weeks (around 04/29/2023) for ROB. -encouraged adding Riboflavin and Co enzyme Q 10 to daily supplements.   Carie Caddy, CNM  , [facility]  04/15/23  1:10 PM

## 2023-04-16 LAB — CERVICOVAGINAL ANCILLARY ONLY
Bacterial Vaginitis (gardnerella): POSITIVE — AB
Candida Glabrata: NEGATIVE
Candida Vaginitis: NEGATIVE
Chlamydia: NEGATIVE
Comment: NEGATIVE
Comment: NEGATIVE
Comment: NEGATIVE
Comment: NEGATIVE
Comment: NEGATIVE
Comment: NORMAL
Neisseria Gonorrhea: NEGATIVE
Trichomonas: NEGATIVE

## 2023-04-17 ENCOUNTER — Other Ambulatory Visit: Payer: Self-pay | Admitting: Licensed Practical Nurse

## 2023-04-17 DIAGNOSIS — N760 Acute vaginitis: Secondary | ICD-10-CM

## 2023-04-17 MED ORDER — METRONIDAZOLE 500 MG PO TABS: 500.0000 mg | ORAL_TABLET | Freq: Two times a day (BID) | ORAL | 0 refills | Status: DC

## 2023-04-17 NOTE — Progress Notes (Signed)
Pt reported abnormal discharge at last visit, swab shows BV. Script for Flagyl sent, mychart message sent Jannifer Hick   [facility]  04/17/23  9:03 PM

## 2023-04-29 ENCOUNTER — Ambulatory Visit (INDEPENDENT_AMBULATORY_CARE_PROVIDER_SITE_OTHER): Payer: Medicaid Other | Admitting: Obstetrics and Gynecology

## 2023-04-29 ENCOUNTER — Encounter: Payer: Self-pay | Admitting: Obstetrics and Gynecology

## 2023-04-29 VITALS — BP 116/68 | HR 86 | Wt 185.9 lb

## 2023-04-29 DIAGNOSIS — Z3A33 33 weeks gestation of pregnancy: Secondary | ICD-10-CM

## 2023-04-29 DIAGNOSIS — O99210 Obesity complicating pregnancy, unspecified trimester: Secondary | ICD-10-CM | POA: Insufficient documentation

## 2023-04-29 DIAGNOSIS — O09293 Supervision of pregnancy with other poor reproductive or obstetric history, third trimester: Secondary | ICD-10-CM

## 2023-04-29 DIAGNOSIS — R519 Headache, unspecified: Secondary | ICD-10-CM

## 2023-04-29 DIAGNOSIS — G35 Multiple sclerosis: Secondary | ICD-10-CM

## 2023-04-29 DIAGNOSIS — O99353 Diseases of the nervous system complicating pregnancy, third trimester: Secondary | ICD-10-CM

## 2023-04-29 DIAGNOSIS — O26813 Pregnancy related exhaustion and fatigue, third trimester: Secondary | ICD-10-CM

## 2023-04-29 DIAGNOSIS — O99891 Other specified diseases and conditions complicating pregnancy: Secondary | ICD-10-CM

## 2023-04-29 DIAGNOSIS — O0990 Supervision of high risk pregnancy, unspecified, unspecified trimester: Secondary | ICD-10-CM

## 2023-04-29 DIAGNOSIS — R110 Nausea: Secondary | ICD-10-CM

## 2023-04-29 LAB — POCT URINALYSIS DIPSTICK OB
Bilirubin, UA: NEGATIVE
Blood, UA: NEGATIVE
Glucose, UA: NEGATIVE
Ketones, UA: NEGATIVE
Leukocytes, UA: NEGATIVE
Nitrite, UA: NEGATIVE
Spec Grav, UA: 1.015 (ref 1.010–1.025)
Urobilinogen, UA: 0.2 E.U./dL
pH, UA: 6.5 (ref 5.0–8.0)

## 2023-04-29 NOTE — Progress Notes (Addendum)
ROB: Patient is a 32 y.o. G2P1001 at [redacted]w[redacted]d who presents for routine OB care.  Pregnancy is complicated by: History of gestational hypertension/pre-eclampsia; Right leg weakness; Demyelinating lesion (HCC); Multiple sclerosis (HCC); Supervision of high risk pregnancy, antepartum; History of PCOS; and Frequent headaches on their problem list.. Patient has complaints of pelvic pressure.  Notes that she has a belly band that she wears sometimes however does not notice any significant improvement in her symptoms.  She also is complaining of feeling more fatigue and some nausea.  I discussed resting and taking vitamin B complex and can use doxylamine in the evenings for nausea.  Patient missed MFM appointment on 6/19, notes that this is not been rescheduled for her follow-up ultrasound.  Will work to get this rescheduled.  Discussed contraception, patient initially unsure, is concerned about the rare risk of ectopic pregnancy after tubal, also notes that she has a history of heavy menses and knows that this will not help to manage her cycle.  Thinks she may just want a hysterectomy at some point, but is worried about how this may be affected by her MS.  I discussed option of utilization of a Mirena IUD that can help manage her periods as well as being just as effective as a tubal ligation without the requirement of surgery.  Patient is willing to think about this option, given handout. Patient is s/p Anesthesia consult on 6/5, per MFM recs.   RTC in 2 weeks

## 2023-04-29 NOTE — Progress Notes (Signed)
ROB [redacted]w[redacted]d: She is doing well. She has been having some cramping and pressure. She reports good fetal movement.

## 2023-04-30 ENCOUNTER — Telehealth: Payer: Self-pay | Admitting: Obstetrics and Gynecology

## 2023-04-30 NOTE — Telephone Encounter (Signed)
Attempt to reach Jessica Mcintosh patient's mother listed on [place]. No answer, no voicemail left.

## 2023-04-30 NOTE — Telephone Encounter (Signed)
Attempt x2 to reach patient's spouse Jessica Mcintosh listed on [place]. No answer/ no voicemail left.

## 2023-04-30 NOTE — Telephone Encounter (Signed)
I attempt to reach patient no answer, no voicemail left x2.

## 2023-04-30 NOTE — Telephone Encounter (Signed)
I contacted the patient via phone. I left voicemail for the patient to call back. There is an opening on Thursday, 7/18 at 11:15 am that we are needing to see if she is available.

## 2023-05-03 NOTE — Telephone Encounter (Signed)
No, we tried to get her in with MFM and their earliest was 8/12.  Let's keep her for 7/26 and send her a Mychart message. Also can inform the next provider she sees to remind her the scheduled appointment for 7/26 when she comes for her next OB visit.

## 2023-05-03 NOTE — Telephone Encounter (Signed)
My  attempt to reach this patient for scheduling for this Thursday for ultrasound has been unsuccessful. Please advise if this patient should be referred out for this week. She is currently scheduled for 7/26 her for Ultrasound.

## 2023-05-13 ENCOUNTER — Ambulatory Visit (INDEPENDENT_AMBULATORY_CARE_PROVIDER_SITE_OTHER): Payer: Medicaid Other | Admitting: Certified Nurse Midwife

## 2023-05-13 VITALS — BP 131/75 | HR 97 | Wt 186.0 lb

## 2023-05-13 DIAGNOSIS — G35 Multiple sclerosis: Secondary | ICD-10-CM

## 2023-05-13 DIAGNOSIS — Z8759 Personal history of other complications of pregnancy, childbirth and the puerperium: Secondary | ICD-10-CM

## 2023-05-13 DIAGNOSIS — Z3A35 35 weeks gestation of pregnancy: Secondary | ICD-10-CM

## 2023-05-13 DIAGNOSIS — O0990 Supervision of high risk pregnancy, unspecified, unspecified trimester: Secondary | ICD-10-CM

## 2023-05-13 LAB — POCT URINALYSIS DIPSTICK OB
Bilirubin, UA: NEGATIVE
Blood, UA: NEGATIVE
Glucose, UA: NEGATIVE
Ketones, UA: NEGATIVE
Leukocytes, UA: NEGATIVE
Nitrite, UA: NEGATIVE
Spec Grav, UA: 1.01 (ref 1.010–1.025)
Urobilinogen, UA: 1 E.U./dL
pH, UA: 6.5 (ref 5.0–8.0)

## 2023-05-13 NOTE — Progress Notes (Signed)
   PRENATAL VISIT NOTE  Subjective:  Jessica Mcintosh is a 32 y.o. G2P1001 at [redacted]w[redacted]d being seen today for ongoing prenatal care.  She is currently monitored for the following issues for this high-risk pregnancy and has History of gestational hypertension; Dysmenorrhea; Right leg weakness; Demyelinating lesion (HCC); Multiple sclerosis (HCC); Supervision of high risk pregnancy, antepartum; History of PCOS; Supervision of other normal pregnancy, antepartum; [redacted] weeks gestation of pregnancy; Frequent headaches; and Obesity in pregnancy on their problem list.  Patient reports  frequent headaches, swelling, elevated blood pressure at home. She reports the headaches are usually mild and go away with rest, tylenol, caffeine. Swelling is worse in afternoon/evening. She also is having numbness & tingling to her feet & hands that is similar to when she was diagnosed with MS .  Her blood pressure at home has been elevated, once 140/77 and then 135/105. Contractions: Irritability. Vag. Bleeding: None.  Movement: Present. Denies leaking of fluid.   The following portions of the patient's history were reviewed and updated as appropriate: allergies, current medications, past family history, past medical history, past social history, past surgical history and problem list.   Objective:   Vitals:   05/13/23 1008  BP: 131/75  Pulse: 97  Weight: 186 lb (84.4 kg)   Total weight gain: 38 lb (17.2 kg) Fetal Status: Fetal Heart Rate (bpm): 160 Fundal Height: 36 cm Movement: Present  Presentation: Vertex   General:  Alert, oriented and cooperative. Patient is in no acute distress.  Skin: Skin is warm and dry. No rash noted.   Cardiovascular: Normal heart rate noted  Respiratory: Normal respiratory effort, no problems with respiration noted  Abdomen: Soft, gravid, appropriate for gestational age.  Pain/Pressure: Present     Pelvic: Cervical exam deferred        Extremities: Normal range of motion.  Edema: Mild  pitting, slight indentation  Mental Status: Normal mood and affect. Normal behavior. Normal judgment and thought content.   Assessment and Plan:  Pregnancy: G2P1001 at [redacted]w[redacted]d 1. Multiple sclerosis (HCC)  2. History of gestational hypertension  3. Supervision of high risk pregnancy, antepartum - CBC With Diff/Platelet - Comprehensive metabolic panel - Protein / creatinine ratio, urine - POC Urinalysis Dipstick OB  4. [redacted] weeks gestation of pregnancy - CBC With Diff/Platelet - Comprehensive metabolic panel - Protein / creatinine ratio, urine   Preterm labor symptoms and general obstetric precautions including but not limited to vaginal bleeding, contractions, leaking of fluid and fetal movement were reviewed in detail with the patient. Please refer to After Visit Summary for other counseling recommendations.   Reinforced signs of pre-eclampsia, when to present to L&D triage. HELLP panel ordered today. Recommend she bring BP cuff to next visit to correlate and reinforced how to correctly take BP at home.  Aware of ultrasound appointment tomorrow. MFM follow-up is scheduled 8/12.  Return in 1 week (on 05/20/2023) for ROB.  Future Appointments  Date Time Provider Department Center  05/14/2023  3:00 PM [facility] [place] [facility] None  05/20/2023  1:55 PM Glenetta Borg, CNM [facility] None  05/31/2023  4:00 PM [facility]  06/03/2023 10:55 AM Dominica Severin, CNM [facility] None  06/10/2023 10:55 AM Free, Lindalou Hose, CNM [facility] None    Dominica Severin, CNM

## 2023-05-13 NOTE — Patient Instructions (Signed)
Preeclampsia and Eclampsia Preeclampsia is a serious condition that may develop during pregnancy. This condition involves high blood pressure during pregnancy and causes symptoms such as headaches, vision changes, and increased swelling in the legs, hands, and face. Preeclampsia occurs after 20 weeks of pregnancy. Eclampsia is a seizure that happens from worsening preeclampsia. Diagnosing and managing preeclampsia early is important. If not treated early, it can cause serious problems for mother and baby. There is no cure for this condition. However, during pregnancy, delivering the baby may be the best treatment for preeclampsia or eclampsia. For most women, symptoms of preeclampsia and eclampsia go away after giving birth. In rare cases, a woman may develop preeclampsia or eclampsia after giving birth. This usually occurs within 48 hours after childbirth but may occur up to 6 weeks after giving birth. What are the causes? The cause of this condition is not known. What increases the risk? The following factors make you more likely to develop preeclampsia: Being pregnant for the first time or being pregnant with multiples. Having had preeclampsia or a condition called hemolysis, elevated liver enzymes, and low platelet count (HELLP)syndrome during a past pregnancy. Having a family history of preeclampsia. Being older than age 58. Being obese. Becoming pregnant through fertility treatments. Conditions that reduce blood flow or oxygen to your placenta and baby may also increase your risk. These include: High blood pressure before, during, or immediately following pregnancy. Kidney disease. Diabetes. Blood clotting disorders. Autoimmune diseases, such as lupus. Sleep apnea. What are the signs or symptoms? Common symptoms of this condition include: A severe, throbbing headache that does not go away. Vision problems, such as blurred or double vision and light sensitivity. Pain in the stomach,  especially the right upper region. Pain in the shoulder. Other symptoms that may develop as the condition gets worse include: Sudden weight gain because of fluid buildup in the body. This causes swelling of the face, hands, legs, and feet. Severe nausea and vomiting. Urinating less than usual. Shortness of breath. Seizures. How is this diagnosed? Your health care provider will ask you about symptoms and check for signs of preeclampsia during your prenatal visits. You will also have routine tests, including: Checking your blood pressure. Urine tests to check for protein. Blood tests to assess your organ function. Monitoring your baby's heart rate. Ultrasounds to check fetal growth. How is this treated? You and your health care provider will determine the treatment that is best for you. Treatment may include: Frequent prenatal visits to check for preeclampsia. Medicine to lower your blood pressure. Medicine to prevent seizures. Low-dose aspirin during your pregnancy. Staying in the hospital, in severe cases. You will be given medicines to control your blood pressure and the amount of fluids in your body. Delivering your baby. Work with your health care provider to manage any chronic health conditions, such as diabetes or kidney problems. Also, work with your health care provider to manage weight gain during pregnancy. Follow these instructions at home: Eating and drinking Drink enough fluid to keep your urine pale yellow. Avoid caffeine. Caffeine may increase blood pressure and heart rate and lead to dehydration. Reduce the amount of salt that you eat. Lifestyle Do not use any products that contain nicotine or tobacco. These products include cigarettes, chewing tobacco, and vaping devices, such as e-cigarettes. If you need help quitting, ask your health care provider. Do not use alcohol or drugs. Avoid stress as much as possible. Rest and get plenty of sleep. General  instructions  Take  over-the-counter and prescription medicines only as told by your health care provider. When lying down, lie on your left side. This keeps pressure off your major blood vessels. When sitting or lying down, raise (elevate) your feet. Try putting pillows underneath your lower legs. Exercise regularly. Ask your health care provider what kinds of exercise are best for you. Check your blood pressure as often as recommended by your health care provider. Keep all prenatal and follow-up visits. This is important. Contact a health care provider if: You have symptoms that may need treatment or closer monitoring. These include: Headaches. Stomach pain or nausea and vomiting. Shoulder pain. Vision problems, such as spots in front of your eyes or blurry vision. Sudden weight gain or increased swelling in your face, hands, legs, and feet. Increased anxiety or feeling of impending doom. Signs or symptoms of labor. Get help right away if: You have any of the following symptoms: A seizure. Shortness of breath or trouble breathing. Trouble speaking or slurred speech. Fainting. Chest pain. These symptoms may represent a serious problem that is an emergency. Do not wait to see if the symptoms will go away. Get medical help right away. Call your local emergency services (911 in the [place] Do not drive yourself to the hospital. Summary Preeclampsia is a serious condition that may develop during pregnancy. Diagnosing and treating preeclampsia early is very important. Keep all prenatal and follow-up visits. This is important. Get help right away if you have a seizure, shortness of breath or trouble breathing, trouble speaking or slurred speech, chest pain, or fainting. This information is not intended to replace advice given to you by your health care provider. Make sure you discuss any questions you have with your health care provider. Document Revised: 06/27/2020 Document Reviewed:  06/27/2020 Elsevier Patient Education  2024 [employer].  Third Trimester of Pregnancy  The third trimester of pregnancy is from week 28 through week 40. This is months 7 through 9. The third trimester is a time when the unborn baby (fetus) is growing rapidly. At the end of the ninth month, the fetus is about 20 inches long and weighs 6-10 pounds. Body changes during your third trimester During the third trimester, your body will continue to go through many changes. The changes vary and generally return to normal after your baby is born. Physical changes Your weight will continue to increase. You can expect to gain 25-35 pounds (11-16 kg) by the end of the pregnancy if you begin pregnancy at a normal weight. If you are underweight, you can expect to gain 28-40 lb (about 13-18 kg), and if you are overweight, you can expect to gain 15-25 lb (about 7-11 kg). You may begin to get stretch marks on your hips, abdomen, and breasts. Your breasts will continue to grow and may hurt. A yellow fluid (colostrum) may leak from your breasts. This is the first milk you are producing for your baby. You may have changes in your hair. These can include thickening of your hair, rapid growth, and changes in texture. Some people also have hair loss during or after pregnancy, or hair that feels dry or thin. Your belly button may stick out. You may notice more swelling in your hands, face, or ankles. Health changes You may have heartburn. You may have constipation. You may develop hemorrhoids. You may develop swollen, bulging veins (varicose veins) in your legs. You may have increased body aches in the pelvis, back, or thighs. This is due to weight gain and  increased hormones that are relaxing your joints. You may have increased tingling or numbness in your hands, arms, and legs. The skin on your abdomen may also feel numb. You may feel short of breath because of your expanding uterus. Other changes You may  urinate more often because the fetus is moving lower into your pelvis and pressing on your bladder. You may have more problems sleeping. This may be caused by the size of your abdomen, an increased need to urinate, and an increase in your body's metabolism. You may notice the fetus "dropping," or moving lower in your abdomen (lightening). You may have increased vaginal discharge. You may notice that you have pain around your pelvic bone as your uterus distends. Follow these instructions at home: Medicines Follow your health care provider's instructions regarding medicine use. Specific medicines may be either safe or unsafe to take during pregnancy. Do not take any medicines unless approved by your health care provider. Take a prenatal vitamin that contains at least 600 micrograms (mcg) of folic acid. Eating and drinking Eat a healthy diet that includes fresh fruits and vegetables, whole grains, good sources of protein such as meat, eggs, or tofu, and low-fat dairy products. Avoid raw meat and unpasteurized juice, milk, and cheese. These carry germs that can harm you and your baby. Eat 4 or 5 small meals rather than 3 large meals a day. You may need to take these actions to prevent or treat constipation: Drink enough fluid to keep your urine pale yellow. Eat foods that are high in fiber, such as beans, whole grains, and fresh fruits and vegetables. Limit foods that are high in fat and processed sugars, such as fried or sweet foods. Activity Exercise only as directed by your health care provider. Most people can continue their usual exercise routine during pregnancy. Try to exercise for 30 minutes at least 5 days a week. Stop exercising if you experience contractions in the uterus. Stop exercising if you develop pain or cramping in the lower abdomen or lower back. Avoid heavy lifting. Do not exercise if it is very hot or humid or if you are at a high altitude. If you choose to, you may continue  to have sex unless your health care provider tells you not to. Relieving pain and discomfort Take frequent breaks and rest with your legs raised (elevated) if you have leg cramps or low back pain. Take warm sitz baths to soothe any pain or discomfort caused by hemorrhoids. Use hemorrhoid cream if your health care provider approves. Wear a supportive bra to prevent discomfort from breast tenderness. If you develop varicose veins: Wear support hose as told by your health care provider. Elevate your feet for 15 minutes, 3-4 times a day. Limit salt in your diet. Safety Talk to your health care provider before traveling far distances. Do not use hot tubs, steam rooms, or saunas. Wear your seat belt at all times when driving or riding in a car. Talk with your health care provider if someone is verbally or physically abusive to you. Preparing for birth To prepare for the arrival of your baby: Take prenatal classes to understand, practice, and ask questions about labor and delivery. Visit the hospital and tour the maternity area. Purchase a rear-facing car seat and make sure you know how to install it in your car. Prepare the baby's room or sleeping area. Make sure to remove all pillows and stuffed animals from the baby's crib to prevent suffocation. General instructions Avoid cat litter  boxes and soil used by cats. These carry germs that can cause birth defects in the baby. If you have a cat, ask someone to clean the litter box for you. Do not douche or use tampons. Do not use scented sanitary pads. Do not use any products that contain nicotine or tobacco, such as cigarettes, e-cigarettes, and chewing tobacco. If you need help quitting, ask your health care provider. Do not use any herbal remedies, illegal drugs, or medicines that were not prescribed to you. Chemicals in these products can harm your baby. Do not drink alcohol. You will have more frequent prenatal exams during the third trimester.  During a routine prenatal visit, your health care provider will do a physical exam, perform tests, and discuss your overall health. Keep all follow-up visits. This is important. Where to find more information American Pregnancy Association: americanpregnancy.org [employer]: https://www.todd-brady.net/ Office on [employer] Health: [employer] Contact a health care provider if you have: A fever. Mild pelvic cramps, pelvic pressure, or nagging pain in your abdominal area or lower back. Vomiting or diarrhea. Bad-smelling vaginal discharge or foul-smelling urine. Pain when you urinate. A headache that does not go away when you take medicine. Visual changes or see spots in front of your eyes. Get help right away if: Your water breaks. You have regular contractions less than 5 minutes apart. You have spotting or bleeding from your vagina. You have severe abdominal pain. You have difficulty breathing. You have chest pain. You have fainting spells. You have not felt your baby move for the time period told by your health care provider. You have new or increased pain, swelling, or redness in an arm or leg. Summary The third trimester of pregnancy is from week 28 through week 40 (months 7 through 9). You may have more problems sleeping. This can be caused by the size of your abdomen, an increased need to urinate, and an increase in your body's metabolism. You will have more frequent prenatal exams during the third trimester. Keep all follow-up visits. This is important. This information is not intended to replace advice given to you by your health care provider. Make sure you discuss any questions you have with your health care provider. Document Revised: 03/13/2020 Document Reviewed: 01/18/2020 Elsevier Patient Education  2024 [employer] Inc[employer]  Group B Streptococcus Infection During Pregnancy Group B Streptococcus (GBS) is a type of  bacteria that is often found in healthy people. It is commonly found in the rectum, vagina, and intestines. In people who are healthy and not pregnant, the bacteria rarely cause serious illness or complications. However, women who test positive for GBS during pregnancy can pass the bacteria to the baby during childbirth. This can cause serious infection in the baby after birth. Women with GBS may also have infections during their pregnancy or soon after childbirth. The infections include urinary tract infections (UTIs) or infections of the uterus. GBS also increases a woman's risk of complications during pregnancy, such as early labor or delivery, miscarriage, or stillbirth. Routine testing for GBS is recommended for all pregnant women. What are the causes? This condition is caused by bacteria called Streptococcus agalactiae. What increases the risk? You may have a higher risk for GBS infection during pregnancy if you had one during a past pregnancy. What are the signs or symptoms? In most cases, GBS infection does not cause symptoms in pregnant women. If symptoms exist, they may include: Labor that starts before the 37th week of pregnancy. A UTI  or bladder infection. This may cause a fever, frequent urination, or pain and burning during urination. Fever during labor. There can also be a rapid heartbeat in the mother or baby. Rare but serious symptoms of a GBS infection in women include: Blood infection (septicemia). This may cause fever, chills, or confusion. Lung infection (pneumonia). This may cause fever, chills, cough, rapid breathing, chest pain, or difficulty breathing. Bone, joint, skin, or soft tissue infection. How is this diagnosed? You may be screened for GBS between week 35 and week 37 of pregnancy. If you have symptoms of preterm labor, you may be screened earlier. This condition is diagnosed based on lab test results from: A swab of fluid from the vagina and rectum. A urine  sample. How is this treated? This condition is treated with antibiotic medicine. Antibiotic medicine may be given: To you when you go into labor, or as soon as your water breaks. The medicines will continue until after you give birth. If you are having a cesarean delivery, you do not need antibiotics unless your water has broken. To your baby, if he or she requires treatment. Your health care provider will check your baby to decide if he or she needs antibiotics to prevent a serious infection. Follow these instructions at home: Take over-the-counter and prescription medicines only as told by your health care provider. Take your antibiotic medicine as told by your health care provider. Do not stop taking the antibiotic even if you start to feel better. Keep all pre-birth (prenatal) visits and follow-up visits as told by your health care provider. This is important. Contact a health care provider if: You have pain or burning when you urinate. You have to urinate more often than usual. You have a fever or chills. You develop a bad-smelling vaginal discharge. Get help right away if: Your water breaks. You go into labor. You have severe pain in your abdomen. You have difficulty breathing. You have chest pain. These symptoms may represent a serious problem that is an emergency. Do not wait to see if the symptoms will go away. Get medical help right away. Call your local emergency services (911 in the [place] Do not drive yourself to the hospital. Summary GBS is a type of bacteria that is common in healthy people. During pregnancy, colonization with GBS can cause serious complications for you or your baby. Your health care provider will screen you between 35 and 37 weeks of pregnancy to determine if you are colonized with GBS. If you are colonized with GBS during pregnancy, your health care provider will recommend antibiotics through an IV during labor. After delivery, your baby will be evaluated  for complications related to potential GBS infection and may require antibiotics to prevent a serious infection. This information is not intended to replace advice given to you by your health care provider. Make sure you discuss any questions you have with your health care provider. Document Revised: 09/21/2022 Document Reviewed: 09/21/2022 Elsevier Patient Education  2024 [employer].

## 2023-05-14 ENCOUNTER — Other Ambulatory Visit: Payer: Self-pay

## 2023-05-14 ENCOUNTER — Ambulatory Visit (INDEPENDENT_AMBULATORY_CARE_PROVIDER_SITE_OTHER): Payer: Medicaid Other

## 2023-05-14 DIAGNOSIS — G35 Multiple sclerosis: Secondary | ICD-10-CM | POA: Diagnosis not present

## 2023-05-14 DIAGNOSIS — O99353 Diseases of the nervous system complicating pregnancy, third trimester: Secondary | ICD-10-CM | POA: Diagnosis not present

## 2023-05-14 DIAGNOSIS — Z3A35 35 weeks gestation of pregnancy: Secondary | ICD-10-CM

## 2023-05-14 LAB — CBC WITH DIFF/PLATELET
MCHC: 32.9 g/dL (ref 31.5–35.7)
WBC: 9.8 10*3/uL (ref 3.4–10.8)

## 2023-05-14 LAB — COMPREHENSIVE METABOLIC PANEL: BUN: 4 mg/dL — ABNORMAL LOW (ref 6–20)

## 2023-05-20 ENCOUNTER — Encounter: Payer: Self-pay | Admitting: Obstetrics

## 2023-05-20 ENCOUNTER — Other Ambulatory Visit (HOSPITAL_COMMUNITY)
Admission: RE | Admit: 2023-05-20 | Discharge: 2023-05-20 | Disposition: A | Discharge status: Home / Self Care | Payer: Medicaid Other | Source: Ambulatory Visit | Attending: Obstetrics | Admitting: Obstetrics

## 2023-05-20 ENCOUNTER — Ambulatory Visit: Payer: Medicaid Other | Admitting: Obstetrics

## 2023-05-20 VITALS — BP 122/81 | HR 100 | Wt 190.0 lb

## 2023-05-20 DIAGNOSIS — Z3A36 36 weeks gestation of pregnancy: Secondary | ICD-10-CM

## 2023-05-20 DIAGNOSIS — Z113 Encounter for screening for infections with a predominantly sexual mode of transmission: Secondary | ICD-10-CM | POA: Diagnosis present

## 2023-05-20 DIAGNOSIS — Z8759 Personal history of other complications of pregnancy, childbirth and the puerperium: Secondary | ICD-10-CM

## 2023-05-20 DIAGNOSIS — Z3480 Encounter for supervision of other normal pregnancy, unspecified trimester: Secondary | ICD-10-CM | POA: Diagnosis present

## 2023-05-20 DIAGNOSIS — O0990 Supervision of high risk pregnancy, unspecified, unspecified trimester: Secondary | ICD-10-CM

## 2023-05-20 DIAGNOSIS — R519 Headache, unspecified: Secondary | ICD-10-CM

## 2023-05-20 DIAGNOSIS — Z3685 Encounter for antenatal screening for Streptococcus B: Secondary | ICD-10-CM | POA: Insufficient documentation

## 2023-05-20 DIAGNOSIS — O99891 Other specified diseases and conditions complicating pregnancy: Secondary | ICD-10-CM

## 2023-05-20 DIAGNOSIS — O99210 Obesity complicating pregnancy, unspecified trimester: Secondary | ICD-10-CM

## 2023-05-20 DIAGNOSIS — O09293 Supervision of pregnancy with other poor reproductive or obstetric history, third trimester: Secondary | ICD-10-CM

## 2023-05-20 NOTE — Assessment & Plan Note (Signed)
-  Discussed increased hydration, Tylenol, caffeine, Benadryl. Offered small rx for Fioricet but Jessica Mcintosh declines at this time

## 2023-05-20 NOTE — Progress Notes (Signed)
    Return Prenatal Note   Assessment/Plan   Plan  32 y.o. G2P1001 at [redacted]w[redacted]d presents for follow-up OB visit. Reviewed prenatal record including previous visit note.  Frequent headaches -Discussed increased hydration, Tylenol, caffeine, Benadryl. Offered small rx for Fioricet but Jessica Mcintosh declines at this time  History of gestational hypertension -Normotensive today -Protein/creatinine ratio collected for swelling and HA  Supervision of high risk pregnancy, antepartum --GBS and GC/chlamydia swabs collected today Discussed s/s of labor and when to go to the hospital -Herbal labor prep handout given    Orders Placed This Encounter  Procedures   Strep Gp B Culture+Rflx   Protein / creatinine ratio, urine   POC Urinalysis Dipstick OB   Return in about 1 week (around 05/27/2023).   Future Appointments  Date Time Provider Department Center  05/26/2023 10:15 AM Hildred Laser, MD [facility] None  05/31/2023  4:00 PM [facility]  06/03/2023 10:55 AM Dominica Severin, CNM [facility] None  06/10/2023 10:55 AM Free, Lindalou Hose, CNM [facility] None    For next visit:  continue with routine prenatal care     Subjective   31 y.o. G2P1001 at [redacted]w[redacted]d presents for this follow-up prenatal visit.  Jessica Mcintosh reports frequent headaches that sometimes respond to Tylenol. She endorses good fetal movement. She reports a yellowish vaginal discharge.  Movement: Present Contractions: Not present  Objective   Flow sheet Vitals: Pulse Rate: 100 BP: 122/81 Fundal Height: 37 cm Fetal Heart Rate (bpm): 152 Total weight gain: 42 lb (19.1 kg)  General Appearance  No acute distress, well appearing, and well nourished Pulmonary   Normal work of breathing Neurologic   Alert and oriented to person, place, and time Psychiatric   Mood and affect within normal limits  [name], CNM 05/20/23 3:30 PM

## 2023-05-20 NOTE — Assessment & Plan Note (Signed)
-  Normotensive today -Protein/creatinine ratio collected for swelling and HA

## 2023-05-20 NOTE — Assessment & Plan Note (Signed)
--  GBS and GC/chlamydia swabs collected today Discussed s/s of labor and when to go to the hospital -Herbal labor prep handout given

## 2023-05-25 ENCOUNTER — Other Ambulatory Visit: Payer: Self-pay

## 2023-05-25 DIAGNOSIS — G35 Multiple sclerosis: Secondary | ICD-10-CM

## 2023-05-25 DIAGNOSIS — Z8742 Personal history of other diseases of the female genital tract: Secondary | ICD-10-CM

## 2023-05-26 ENCOUNTER — Encounter: Payer: Medicaid Other | Admitting: Obstetrics and Gynecology

## 2023-05-26 ENCOUNTER — Ambulatory Visit (INDEPENDENT_AMBULATORY_CARE_PROVIDER_SITE_OTHER): Payer: Medicaid Other | Admitting: Obstetrics and Gynecology

## 2023-05-26 ENCOUNTER — Encounter: Payer: Self-pay | Admitting: Obstetrics and Gynecology

## 2023-05-26 VITALS — BP 132/85 | HR 89 | Wt 194.4 lb

## 2023-05-26 DIAGNOSIS — G35 Multiple sclerosis: Secondary | ICD-10-CM

## 2023-05-26 DIAGNOSIS — O1203 Gestational edema, third trimester: Secondary | ICD-10-CM

## 2023-05-26 DIAGNOSIS — O0990 Supervision of high risk pregnancy, unspecified, unspecified trimester: Secondary | ICD-10-CM

## 2023-05-26 DIAGNOSIS — R519 Headache, unspecified: Secondary | ICD-10-CM

## 2023-05-26 DIAGNOSIS — Z3483 Encounter for supervision of other normal pregnancy, third trimester: Secondary | ICD-10-CM

## 2023-05-26 DIAGNOSIS — Z3A37 37 weeks gestation of pregnancy: Secondary | ICD-10-CM

## 2023-05-26 DIAGNOSIS — O0993 Supervision of high risk pregnancy, unspecified, third trimester: Secondary | ICD-10-CM

## 2023-05-26 DIAGNOSIS — O99210 Obesity complicating pregnancy, unspecified trimester: Secondary | ICD-10-CM

## 2023-05-26 DIAGNOSIS — Z3A36 36 weeks gestation of pregnancy: Secondary | ICD-10-CM

## 2023-05-26 LAB — POCT URINALYSIS DIPSTICK OB
Bilirubin, UA: NEGATIVE
Blood, UA: NEGATIVE
Glucose, UA: NEGATIVE
Ketones, UA: NEGATIVE
Leukocytes, UA: NEGATIVE
Nitrite, UA: NEGATIVE
POC,PROTEIN,UA: NEGATIVE
Spec Grav, UA: 1.015 (ref 1.010–1.025)
Urobilinogen, UA: 0.2 E.U./dL
pH, UA: 6.5 (ref 5.0–8.0)

## 2023-05-26 NOTE — Progress Notes (Signed)
ROB: Patient is a 32 y.o. G2P1001 at [redacted]w[redacted]d who presents for high risk OB care.  Pregnancy is complicated by  History of gestational hypertension; Dysmenorrhea; Right leg weakness; Demyelinating lesion (HCC); Multiple sclerosis (HCC); Supervision of high risk pregnancy, antepartum; History of PCOS; Frequent headaches; and Obesity in pregnancy.   Patient has complaints of continued significant feet and leg swelling bilaterally, also continues to have headaches.  Has a h/o pre-eclampsia in a prior pregnancy so has been somewhat worried. PIH labs performed last visit normal.  BP today borderline, 132/85. Reviewed PIH precautions. Offered again treatment for headaches, but patient declines. Advised on compression stockings, continuing to elevate legs when resting. GBS positive, discussed need for Abx in labor.  RTC in 1 week.

## 2023-05-31 ENCOUNTER — Ambulatory Visit: Payer: Medicaid Other

## 2023-06-01 ENCOUNTER — Telehealth: Payer: Self-pay

## 2023-06-01 NOTE — Telephone Encounter (Signed)
TRIAGE VOICEMAIL: Patient reports she is 38 wks and having some pretty bad cramps in the lower part of her stomach. Requesting suggestions on what to do.

## 2023-06-01 NOTE — Telephone Encounter (Signed)
Left voicemail to return call. 

## 2023-06-02 NOTE — Telephone Encounter (Signed)
Spoke with patient. She reports she missed the voicemail that was left for her yesterday. She states the cramps subsided. Advised on protocol of when to report to L&D for labor precautions. Patient has scheduled appointment tomorrow with Hartley Barefoot, CNM.

## 2023-06-03 ENCOUNTER — Encounter: Payer: Medicaid Other | Admitting: Certified Nurse Midwife

## 2023-06-10 ENCOUNTER — Ambulatory Visit (INDEPENDENT_AMBULATORY_CARE_PROVIDER_SITE_OTHER): Payer: Medicaid Other

## 2023-06-10 VITALS — BP 148/90 | Wt 198.0 lb

## 2023-06-10 DIAGNOSIS — R030 Elevated blood-pressure reading, without diagnosis of hypertension: Secondary | ICD-10-CM

## 2023-06-10 DIAGNOSIS — Z3A39 39 weeks gestation of pregnancy: Secondary | ICD-10-CM

## 2023-06-10 DIAGNOSIS — O163 Unspecified maternal hypertension, third trimester: Secondary | ICD-10-CM

## 2023-06-10 DIAGNOSIS — O0993 Supervision of high risk pregnancy, unspecified, third trimester: Secondary | ICD-10-CM

## 2023-06-10 DIAGNOSIS — O0990 Supervision of high risk pregnancy, unspecified, unspecified trimester: Secondary | ICD-10-CM

## 2023-06-10 NOTE — Assessment & Plan Note (Signed)
First elevated blood pressure in this pregnancy at [redacted]w[redacted]d, PIH labs collected, plan for BP check tomorrow with induction if still elevated. Reviewed preeclampsia precautions. Was hoping to not be induced with this pregnancy as she did not have a good experience with her last birth but understands the recommendations if she continues to have elevated blood pressures or preeclampsia.

## 2023-06-10 NOTE — Assessment & Plan Note (Signed)
Membrane sweep with cervical exam today. 1-2/50/-3 Reviewed labor warning signs and expectations for birth. Instructed to call office or come to hospital with persistent headache, vision changes, regular contractions, leaking of fluid, decreased fetal movement or vaginal bleeding.

## 2023-06-10 NOTE — Progress Notes (Signed)
    Return Prenatal Note   Assessment/Plan   Plan  32 y.o. G2P1001 at [redacted]w[redacted]d presents for follow-up OB visit. Reviewed prenatal record including previous visit note.  Elevated blood pressure affecting pregnancy in third trimester, antepartum First elevated blood pressure in this pregnancy at [redacted]w[redacted]d, PIH labs collected, plan for BP check tomorrow with induction if still elevated. Reviewed preeclampsia precautions. Was hoping to not be induced with this pregnancy as she did not have a good experience with her last birth but understands the recommendations if she continues to have elevated blood pressures or preeclampsia.   Supervision of high risk pregnancy, antepartum Membrane sweep with cervical exam today. 1-2/50/-3 Reviewed labor warning signs and expectations for birth. Instructed to call office or come to hospital with persistent headache, vision changes, regular contractions, leaking of fluid, decreased fetal movement or vaginal bleeding.   Orders Placed This Encounter  Procedures   Comprehensive metabolic panel   CBC   Protein / creatinine ratio, urine   Return in about 1 week (around 06/17/2023) for ROB.   Future Appointments  Date Time Provider Department Center  06/11/2023 10:55 AM Lina Hitch, Lindalou Hose, CNM [facility] None  06/17/2023 10:55 AM Tresea Mall, CNM [facility] None    For next visit:   Blood pressure check and possible induction if elevated     Subjective   32 y.o. G2P1001 at [redacted]w[redacted]d presents for this follow-up prenatal visit.  Patient ready for baby, desires membrane sweep today. Patient reports: Movement: Present Contractions: Irritability  Objective   Flow sheet Vitals: BP: (!) 148/90 Fundal Height: 39 cm Fetal Heart Rate (bpm): 155 Presentation: Vertex Total weight gain: 50 lb (22.7 kg)  General Appearance  No acute distress, well appearing, and well nourished Pulmonary   Normal work of breathing Neurologic   Alert and oriented to person, place, and  time Psychiatric   Mood and affect within normal limits  Lindalou Hose Darlene Bartelt, CNM  06/09/2410:59 AM

## 2023-06-11 ENCOUNTER — Ambulatory Visit: Payer: Medicaid Other

## 2023-06-11 ENCOUNTER — Ambulatory Visit (INDEPENDENT_AMBULATORY_CARE_PROVIDER_SITE_OTHER): Payer: Medicaid Other

## 2023-06-11 VITALS — BP 150/100 | Wt 198.0 lb

## 2023-06-11 VITALS — BP 148/90 | Ht 61.0 in | Wt 198.0 lb

## 2023-06-11 DIAGNOSIS — Z3A39 39 weeks gestation of pregnancy: Secondary | ICD-10-CM | POA: Insufficient documentation

## 2023-06-11 DIAGNOSIS — O133 Gestational [pregnancy-induced] hypertension without significant proteinuria, third trimester: Secondary | ICD-10-CM

## 2023-06-11 LAB — COMPREHENSIVE METABOLIC PANEL
ALT: 12 IU/L (ref 0–32)
AST: 18 IU/L (ref 0–40)
Albumin: 3 g/dL — ABNORMAL LOW (ref 3.9–4.9)
Alkaline Phosphatase: 195 IU/L — ABNORMAL HIGH (ref 44–121)
BUN/Creatinine Ratio: 10 (ref 9–23)
BUN: 5 mg/dL — ABNORMAL LOW (ref 6–20)
Bilirubin Total: <0.2 mg/dL (ref 0.0–1.2)
CO2: 19 mmol/L — ABNORMAL LOW (ref 20–29)
Calcium: 8.8 mg/dL (ref 8.7–10.2)
Chloride: 105 mmol/L (ref 96–106)
Creatinine, Ser: 0.51 mg/dL — ABNORMAL LOW (ref 0.57–1.00)
Globulin, Total: 2.5 g/dL (ref 1.5–4.5)
Glucose: 88 mg/dL (ref 70–99)
Potassium: 3.8 mmol/L (ref 3.5–5.2)
Sodium: 137 mmol/L (ref 134–144)
Total Protein: 5.5 g/dL — ABNORMAL LOW (ref 6.0–8.5)
eGFR: 128 mL/min/{1.73_m2} (ref >59)

## 2023-06-11 LAB — CBC
Hematocrit: 34.3 % (ref 34.0–46.6)
Hemoglobin: 11 g/dL — ABNORMAL LOW (ref 11.1–15.9)
MCH: 27.2 pg (ref 26.6–33.0)
MCHC: 32.1 g/dL (ref 31.5–35.7)
MCV: 85 fL (ref 79–97)
Platelets: 235 10*3/uL (ref 150–450)
RBC: 4.05 x10E6/uL (ref 3.77–5.28)
RDW: 13 % (ref 11.7–15.4)
WBC: 10.1 10*3/uL (ref 3.4–10.8)

## 2023-06-11 LAB — PROTEIN / CREATININE RATIO, URINE
Creatinine, Urine: 57 mg/dL
Protein, Ur: 7.5 mg/dL
Protein/Creat Ratio: 132 mg/g{creat} (ref 0–200)

## 2023-06-11 NOTE — Progress Notes (Signed)
    NURSE VISIT NOTE  Subjective:    Patient ID: Hanley Hays, female    DOB: Apr 05, 1991, 32 y.o.   MRN: [id number]  HPI  Patient is a 32 y.o. G32P1001 female who presents for fetal monitoring per order from Autumn Messing, [place].   Objective:    BP (!) 148/90   Ht 5\' 1"  (1.549 m)   Wt 198 lb (89.8 kg)   LMP 09/09/2022 (Exact Date)   BMI 37.41 kg/m  Estimated Date of Delivery: 06/16/23  [redacted]w[redacted]d  Fetus A Non-Stress Test Interpretation for 06/11/23  Indication: Gestational Hypertension  Fetal Heart Rate A Mode: External Baseline Rate (A): 135 bpm Variability: Moderate Accelerations: 15 x 15 Decelerations: None Multiple birth?: No  Uterine Activity Mode: Toco Contraction Frequency (min): x1 Contraction Duration (sec): 90 Contraction Quality: Mild Resting Time: Adequate  Interpretation (Fetal Testing) Nonstress Test Interpretation: Reactive Overall Impression: Reassuring for gestational age   Assessment:   1. Gestational hypertension, third trimester   2. [redacted] weeks gestation of pregnancy      Plan:   Results reviewed and discussed with patient by  Autumn Messing, CNM.     Rocco Serene, LPN

## 2023-06-11 NOTE — Assessment & Plan Note (Signed)
-   Reviewed she now meets the criteria for gestational hypertension. CBC and CMP from yesterday are normal, UP:C still pending. - Reactive NST in clinic today. - Discussed recommendations for induction and risks associated with PIH including possible progression to preeclampsia. At this time, she desires to wait for induction until her due date. She is scheduled for IOL on 8/26 at 0800 but may decline if no signs of labor. We reviewed preeclampsia precautions and when to come to the hospital.

## 2023-06-11 NOTE — Progress Notes (Signed)
    Return Prenatal Note   Assessment/Plan   Plan  32 y.o. G2P1001 at [redacted]w[redacted]d presents for follow-up OB visit. Reviewed prenatal record including previous visit note.  Gestational hypertension, third trimester - Reviewed she now meets the criteria for gestational hypertension. CBC and CMP from yesterday are normal, UP:C still pending. - Reactive NST in clinic today. - Discussed recommendations for induction and risks associated with PIH including possible progression to preeclampsia. At this time, she desires to wait for induction until her due date. She is scheduled for IOL on 8/26 at 0800 but may decline if no signs of labor. We reviewed preeclampsia precautions and when to come to the hospital.    No orders of the defined types were placed in this encounter.  No follow-ups on file.   Future Appointments  Date Time Provider Department Center  06/17/2023 10:55 AM Tresea Mall, CNM [facility] None      Subjective   32 y.o. G2P1001 at [redacted]w[redacted]d presents for this follow-up prenatal visit.  Patient has questions about necessity for induction. Patient reports: Movement: Present Contractions: Irregular  Objective   Flow sheet Vitals: BP: (!) 150/100 Fetal Heart Rate (bpm): RNST Total weight gain: 50 lb (22.7 kg)  General Appearance  No acute distress, well appearing, and well nourished Pulmonary   Normal work of breathing Neurologic   Alert and oriented to person, place, and time Psychiatric   Mood and affect within normal limits  Lindalou Hose Heyward Douthit, CNM  06/11/2411:30 PM

## 2023-06-14 ENCOUNTER — Encounter: Payer: Self-pay | Admitting: Certified Nurse Midwife

## 2023-06-14 ENCOUNTER — Inpatient Hospital Stay: Payer: Medicaid Other | Admitting: General Practice

## 2023-06-14 ENCOUNTER — Other Ambulatory Visit: Payer: Self-pay

## 2023-06-14 ENCOUNTER — Inpatient Hospital Stay
Admission: EM | Admit: 2023-06-14 | Discharge: 2023-06-16 | DRG: 805 | Disposition: A | Discharge status: Home / Self Care | Payer: Medicaid Other | Attending: Certified Nurse Midwife | Admitting: Certified Nurse Midwife

## 2023-06-14 DIAGNOSIS — O9081 Anemia of the puerperium: Secondary | ICD-10-CM | POA: Diagnosis not present

## 2023-06-14 DIAGNOSIS — G35 Multiple sclerosis: Secondary | ICD-10-CM | POA: Diagnosis present

## 2023-06-14 DIAGNOSIS — O411230 Chorioamnionitis, third trimester, not applicable or unspecified: Secondary | ICD-10-CM | POA: Diagnosis present

## 2023-06-14 DIAGNOSIS — O99354 Diseases of the nervous system complicating childbirth: Secondary | ICD-10-CM | POA: Diagnosis present

## 2023-06-14 DIAGNOSIS — D62 Acute posthemorrhagic anemia: Secondary | ICD-10-CM

## 2023-06-14 DIAGNOSIS — O163 Unspecified maternal hypertension, third trimester: Secondary | ICD-10-CM

## 2023-06-14 DIAGNOSIS — O411030 Infection of amniotic sac and membranes, unspecified, third trimester, not applicable or unspecified: Secondary | ICD-10-CM

## 2023-06-14 DIAGNOSIS — O99824 Streptococcus B carrier state complicating childbirth: Secondary | ICD-10-CM | POA: Diagnosis present

## 2023-06-14 DIAGNOSIS — Z3A39 39 weeks gestation of pregnancy: Secondary | ICD-10-CM | POA: Diagnosis not present

## 2023-06-14 DIAGNOSIS — O4202 Full-term premature rupture of membranes, onset of labor within 24 hours of rupture: Secondary | ICD-10-CM

## 2023-06-14 DIAGNOSIS — O134 Gestational [pregnancy-induced] hypertension without significant proteinuria, complicating childbirth: Secondary | ICD-10-CM | POA: Diagnosis present

## 2023-06-14 DIAGNOSIS — O99214 Obesity complicating childbirth: Secondary | ICD-10-CM | POA: Diagnosis present

## 2023-06-14 DIAGNOSIS — O99820 Streptococcus B carrier state complicating pregnancy: Secondary | ICD-10-CM | POA: Diagnosis not present

## 2023-06-14 DIAGNOSIS — Z880 Allergy status to penicillin: Secondary | ICD-10-CM

## 2023-06-14 DIAGNOSIS — O9903 Anemia complicating the puerperium: Secondary | ICD-10-CM

## 2023-06-14 LAB — CBC
HCT: 36.4 % (ref 36.0–46.0)
Hemoglobin: 11.7 g/dL — ABNORMAL LOW (ref 12.0–15.0)
MCH: 27.4 pg (ref 26.0–34.0)
MCHC: 32.1 g/dL (ref 30.0–36.0)
MCV: 85.2 fL (ref 80.0–100.0)
Platelets: 270 10*3/uL (ref 150–400)
RBC: 4.27 MIL/uL (ref 3.87–5.11)
RDW: 13.6 % (ref 11.5–15.5)
WBC: 14.5 10*3/uL — ABNORMAL HIGH (ref 4.0–10.5)
nRBC: 0 % (ref 0.0–0.2)

## 2023-06-14 LAB — COMPREHENSIVE METABOLIC PANEL
ALT: 14 U/L (ref 0–44)
AST: 24 U/L (ref 15–41)
Albumin: 2.7 g/dL — ABNORMAL LOW (ref 3.5–5.0)
Alkaline Phosphatase: 185 U/L — ABNORMAL HIGH (ref 38–126)
Anion gap: 9 (ref 5–15)
BUN: 7 mg/dL (ref 6–20)
CO2: 19 mmol/L — ABNORMAL LOW (ref 22–32)
Calcium: 8.6 mg/dL — ABNORMAL LOW (ref 8.9–10.3)
Chloride: 106 mmol/L (ref 98–111)
Creatinine, Ser: 0.48 mg/dL (ref 0.44–1.00)
GFR, Estimated: >60 mL/min (ref >60)
Glucose, Bld: 98 mg/dL (ref 70–99)
Potassium: 3.7 mmol/L (ref 3.5–5.1)
Sodium: 134 mmol/L — ABNORMAL LOW (ref 135–145)
Total Bilirubin: 0.3 mg/dL (ref 0.3–1.2)
Total Protein: 6.6 g/dL (ref 6.5–8.1)

## 2023-06-14 LAB — TYPE AND SCREEN
ABO/RH(D): O POS
Antibody Screen: NEGATIVE

## 2023-06-14 LAB — PROTEIN / CREATININE RATIO, URINE
Creatinine, Urine: 154 mg/dL
Protein Creatinine Ratio: 0.12 mg/mg{Cre} (ref 0.00–0.15)
Total Protein, Urine: 18 mg/dL

## 2023-06-14 LAB — RPR: RPR Ser Ql: NONREACTIVE

## 2023-06-14 MED ORDER — LACTATED RINGERS IV SOLN: 500.0000 mL | INTRAVENOUS | Status: DC | PRN

## 2023-06-14 MED ORDER — DIPHENHYDRAMINE HCL 50 MG/ML IJ SOLN: 12.5000 mg | INTRAMUSCULAR | Status: DC | PRN

## 2023-06-14 MED ORDER — DIPHENHYDRAMINE HCL 25 MG PO CAPS: 25.0000 mg | ORAL_CAPSULE | Freq: Four times a day (QID) | ORAL | Status: DC | PRN

## 2023-06-14 MED ORDER — LIDOCAINE-EPINEPHRINE (PF) 1.5 %-1:200000 IJ SOLN
INTRAMUSCULAR | Status: DC | PRN
  Administered 2023-06-14: 3 mL via PERINEURAL

## 2023-06-14 MED ORDER — PRENATAL MULTIVITAMIN CH
1.0000 | ORAL_TABLET | Freq: Every day | ORAL | Status: DC
  Administered 2023-06-14 – 2023-06-15 (×2): 1 via ORAL
  Filled 2023-06-14 (×2): qty 1

## 2023-06-14 MED ORDER — FENTANYL-BUPIVACAINE-NACL 0.5-0.125-0.9 MG/250ML-% EP SOLN
12.0000 mL/h | EPIDURAL | Status: DC | PRN
  Administered 2023-06-14: 12 mL/h via EPIDURAL
  Filled 2023-06-14: qty 250

## 2023-06-14 MED ORDER — FENTANYL CITRATE (PF) 100 MCG/2ML IJ SOLN
50.0000 ug | INTRAMUSCULAR | Status: DC | PRN
  Administered 2023-06-14: 50 ug via INTRAVENOUS
  Filled 2023-06-14: qty 2

## 2023-06-14 MED ORDER — COCONUT OIL OIL
1.0000 | TOPICAL_OIL | Status: DC | PRN
  Filled 2023-06-14: qty 15

## 2023-06-14 MED ORDER — GENTAMICIN SULFATE 40 MG/ML IJ SOLN
5.0000 mg/kg | INTRAVENOUS | Status: DC
  Administered 2023-06-14: 450 mg via INTRAVENOUS
  Filled 2023-06-14: qty 11.25

## 2023-06-14 MED ORDER — ACETAMINOPHEN 500 MG PO TABS
1000.0000 mg | ORAL_TABLET | Freq: Four times a day (QID) | ORAL | Status: DC
  Administered 2023-06-14 – 2023-06-16 (×6): 1000 mg via ORAL
  Filled 2023-06-14 (×7): qty 2

## 2023-06-14 MED ORDER — FUROSEMIDE 20 MG PO TABS
20.0000 mg | ORAL_TABLET | Freq: Every day | ORAL | Status: DC
  Administered 2023-06-14 – 2023-06-16 (×2): 20 mg via ORAL
  Filled 2023-06-14 (×3): qty 1

## 2023-06-14 MED ORDER — VANCOMYCIN HCL 1.25 G IV SOLR
1250.0000 mg | Freq: Three times a day (TID) | INTRAVENOUS | Status: DC
  Administered 2023-06-14: 1250 mg via INTRAVENOUS
  Filled 2023-06-14: qty 25
  Filled 2023-06-14: qty 1250

## 2023-06-14 MED ORDER — ZOLPIDEM TARTRATE 5 MG PO TABS: 5.0000 mg | ORAL_TABLET | Freq: Every evening | ORAL | Status: DC | PRN

## 2023-06-14 MED ORDER — DIBUCAINE (PERIANAL) 1 % EX OINT
1.0000 | TOPICAL_OINTMENT | CUTANEOUS | Status: DC | PRN
  Filled 2023-06-14: qty 28

## 2023-06-14 MED ORDER — ONDANSETRON HCL 4 MG/2ML IJ SOLN: 4.0000 mg | INTRAMUSCULAR | Status: DC | PRN

## 2023-06-14 MED ORDER — LABETALOL HCL 5 MG/ML IV SOLN: 40.0000 mg | INTRAVENOUS | Status: DC | PRN

## 2023-06-14 MED ORDER — ACETAMINOPHEN 325 MG PO TABS
650.0000 mg | ORAL_TABLET | ORAL | Status: DC | PRN
  Administered 2023-06-14: 650 mg via ORAL
  Filled 2023-06-14: qty 2

## 2023-06-14 MED ORDER — CLINDAMYCIN PHOSPHATE 900 MG/50ML IV SOLN
INTRAVENOUS | Status: AC
  Administered 2023-06-14: 900 mg via INTRAVENOUS
  Filled 2023-06-14: qty 50

## 2023-06-14 MED ORDER — LACTATED RINGERS IV SOLN: 500.0000 mL | Freq: Once | INTRAVENOUS | Status: DC

## 2023-06-14 MED ORDER — SODIUM CHLORIDE 0.9 % IV SOLN
INTRAVENOUS | Status: DC | PRN
  Administered 2023-06-14 (×2): 5 mL via EPIDURAL

## 2023-06-14 MED ORDER — LIDOCAINE HCL (PF) 1 % IJ SOLN
30.0000 mL | INTRAMUSCULAR | Status: DC | PRN
  Filled 2023-06-14: qty 30

## 2023-06-14 MED ORDER — OXYTOCIN BOLUS FROM INFUSION
333.0000 mL | Freq: Once | INTRAVENOUS | Status: AC
  Administered 2023-06-14: 333 mL via INTRAVENOUS

## 2023-06-14 MED ORDER — IBUPROFEN 600 MG PO TABS
600.0000 mg | ORAL_TABLET | Freq: Four times a day (QID) | ORAL | Status: DC | PRN
  Administered 2023-06-14 – 2023-06-15 (×5): 600 mg via ORAL
  Filled 2023-06-14 (×6): qty 1

## 2023-06-14 MED ORDER — EPHEDRINE 5 MG/ML INJ: 10.0000 mg | INTRAVENOUS | Status: DC | PRN

## 2023-06-14 MED ORDER — SIMETHICONE 80 MG PO CHEW: 80.0000 mg | CHEWABLE_TABLET | ORAL | Status: DC | PRN

## 2023-06-14 MED ORDER — LACTATED RINGERS IV SOLN: INTRAVENOUS | Status: DC

## 2023-06-14 MED ORDER — AMMONIA AROMATIC IN INHA
RESPIRATORY_TRACT | Status: AC
  Filled 2023-06-14: qty 10

## 2023-06-14 MED ORDER — SOD CITRATE-CITRIC ACID 500-334 MG/5ML PO SOLN: 30.0000 mL | ORAL | Status: DC | PRN

## 2023-06-14 MED ORDER — CLINDAMYCIN PHOSPHATE 900 MG/50ML IV SOLN: 900.0000 mg | Freq: Three times a day (TID) | INTRAVENOUS | Status: DC

## 2023-06-14 MED ORDER — BENZOCAINE-MENTHOL 20-0.5 % EX AERO
1.0000 | INHALATION_SPRAY | CUTANEOUS | Status: DC | PRN
  Administered 2023-06-14: 1 via TOPICAL
  Filled 2023-06-14 (×2): qty 56

## 2023-06-14 MED ORDER — OXYTOCIN 10 UNIT/ML IJ SOLN
INTRAMUSCULAR | Status: AC
  Filled 2023-06-14: qty 2

## 2023-06-14 MED ORDER — ONDANSETRON HCL 4 MG PO TABS: 4.0000 mg | ORAL_TABLET | ORAL | Status: DC | PRN

## 2023-06-14 MED ORDER — LABETALOL HCL 5 MG/ML IV SOLN
20.0000 mg | INTRAVENOUS | Status: DC | PRN
  Administered 2023-06-14: 20 mg via INTRAVENOUS
  Filled 2023-06-14: qty 4

## 2023-06-14 MED ORDER — LABETALOL HCL 5 MG/ML IV SOLN: 80.0000 mg | INTRAVENOUS | Status: DC | PRN

## 2023-06-14 MED ORDER — WITCH HAZEL-GLYCERIN EX PADS
1.0000 | MEDICATED_PAD | CUTANEOUS | Status: DC | PRN
  Filled 2023-06-14: qty 100

## 2023-06-14 MED ORDER — DOCUSATE SODIUM 100 MG PO CAPS
100.0000 mg | ORAL_CAPSULE | Freq: Two times a day (BID) | ORAL | Status: DC
  Administered 2023-06-14 – 2023-06-16 (×4): 100 mg via ORAL
  Filled 2023-06-14 (×4): qty 1

## 2023-06-14 MED ORDER — PHENYLEPHRINE 80 MCG/ML (10ML) SYRINGE FOR IV PUSH (FOR BLOOD PRESSURE SUPPORT): 80.0000 ug | PREFILLED_SYRINGE | INTRAVENOUS | Status: DC | PRN

## 2023-06-14 MED ORDER — MISOPROSTOL 200 MCG PO TABS
ORAL_TABLET | ORAL | Status: AC
  Filled 2023-06-14: qty 4

## 2023-06-14 MED ORDER — ONDANSETRON HCL 4 MG/2ML IJ SOLN: 4.0000 mg | Freq: Four times a day (QID) | INTRAMUSCULAR | Status: DC | PRN

## 2023-06-14 MED ORDER — EPHEDRINE 5 MG/ML INJ
10.0000 mg | INTRAVENOUS | Status: DC | PRN
  Filled 2023-06-14: qty 5

## 2023-06-14 MED ORDER — HYDRALAZINE HCL 20 MG/ML IJ SOLN: 10.0000 mg | INTRAMUSCULAR | Status: DC | PRN

## 2023-06-14 MED ORDER — OXYTOCIN-SODIUM CHLORIDE 30-0.9 UT/500ML-% IV SOLN
2.5000 [IU]/h | INTRAVENOUS | Status: DC
  Administered 2023-06-14: 2.5 [IU]/h via INTRAVENOUS
  Filled 2023-06-14: qty 500

## 2023-06-14 NOTE — Progress Notes (Signed)
0805 vacuum applied by MD- verbal consent obtained. 3 pulls, no popoffs.  1610 baby born.

## 2023-06-14 NOTE — Progress Notes (Signed)
Report given to [facility] CNM. Relinquished care of pt .   Doreene Burke, CNM

## 2023-06-14 NOTE — H&P (Signed)
History and Physical   HPI  Jessica Mcintosh is a 32 y.o. G2P1001 at [redacted]w[redacted]d Estimated Date of Delivery: 06/16/23 who is being admitted for labor management and spontaneous rupture of membranes @2200  for clear fluid.  She has gestational hypertension not currently on mediatation.    OB History  OB History  Gravida Para Term Preterm AB Living  2 1 1  0 0 1  SAB IAB Ectopic Multiple Live Births  0 0 0 0 1    # Outcome Date GA Lbr Len/2nd Weight Sex Type Anes PTL Lv  2 Current           1 Term 10/08/17 [redacted]w[redacted]d 03:50 / 00:42 3310 g M Vag-Spont EPI  LIV     Complications: Preeclampsia     Name: Darrell     Apgar1: 6  Apgar5: 9    PROBLEM LIST  Pregnancy complications or risks: Patient Active Problem List   Diagnosis Date Noted   Gestational hypertension, third trimester 06/11/2023   [redacted] weeks gestation of pregnancy 06/11/2023   Elevated blood pressure affecting pregnancy in third trimester, antepartum 06/10/2023   Obesity in pregnancy 04/29/2023   Frequent headaches 03/17/2023   Supervision of high risk pregnancy, antepartum 02/24/2023   History of PCOS 02/24/2023   Right leg weakness 06/06/2021   Demyelinating lesion (HCC)    Multiple sclerosis (HCC)    Dysmenorrhea 05/30/2019   History of gestational hypertension 10/07/2017    Prenatal labs and studies: ABO, Rh: O/Positive/-- (03/01 0901) Antibody: Negative (03/01 0901) Rubella: 2.58 (03/01 0901) RPR: Non Reactive (05/29 1015)  HBsAg: Negative (03/01 0901)  HIV: Non Reactive (05/29 1015)  ZOX:WRUEAVWU/-- (08/01 1502)   Past Medical History:  Diagnosis Date   [redacted] weeks gestation of pregnancy 03/17/2023   Acute focal neurological deficit 06/06/2021   ADD (attention deficit disorder) 05/28/2020   Allergy    Body mass index (BMI) of 31.0-31.9 in adult 03/20/2020   Fatigue 03/20/2020   High risk medication use 06/17/2021   MS (multiple sclerosis) (HCC)    Numbness and tingling of right lower extremity     Ovarian cyst    Preeclampsia 2018   Screening for blood or protein in urine 03/20/2020   Tenderness of female pelvic organs 03/20/2020   Vitamin D deficiency 03/20/2020   Weight gain 03/20/2020     Past Surgical History:  Procedure Laterality Date   WISDOM TOOTH EXTRACTION     four;  teens     Medications    Current Discharge Medication List     CONTINUE these medications which have NOT CHANGED   Details  Prenatal Vit-Fe Fumarate-FA (MULTIVITAMIN-PRENATAL) 27-0.8 MG TABS tablet Take 1 tablet by mouth daily at 12 noon.         Allergies  Aspirin and Penicillins  Review of Systems  Eyes: negative Ears, nose, mouth, throat, and face: negative Respiratory: negative Cardiovascular: negative Gastrointestinal: negative Genitourinary:negative Integument/breast: negative Hematologic/lymphatic: negative Musculoskeletal:negative Neurological: negative Behavioral/Psych: negative Endocrine: negative Allergic/Immunologic: negative  Physical Exam  LMP 09/09/2022 (Exact Date)   Lungs:  CTA B Cardio: RRR without M/R/G Abd: Soft, gravid, NT Presentation: cephalic EXT: No C/C/ 1+ Edema DTRs: 2+ B CERVIX: Dilation: 3 Effacement (%): 60 Station: -2 Presentation: Vertex Exam by:: Robyne Askew RN  See Prenatal records for more detailed PE.     FHR:  Baseline: 135 bpm, Variability: Good {> 6 bpm), Accelerations: Reactive, and Decelerations: Absent  Toco: Uterine Contractions: Frequency: Every 2-3 minutes and Intensity:  moderate  Test Results  No results found for this or any previous visit (from the past 24 hour(s)). Group B Strep positive  Assessment   G2P1001 at [redacted]w[redacted]d Estimated Date of Delivery: 06/16/23  The fetus is reassuring.   Patient Active Problem List   Diagnosis Date Noted   Gestational hypertension, third trimester 06/11/2023   [redacted] weeks gestation of pregnancy 06/11/2023   Elevated blood pressure affecting pregnancy in third trimester,  antepartum 06/10/2023   Obesity in pregnancy 04/29/2023   Frequent headaches 03/17/2023   Supervision of high risk pregnancy, antepartum 02/24/2023   History of PCOS 02/24/2023   Right leg weakness 06/06/2021   Demyelinating lesion (HCC)    Multiple sclerosis (HCC)    Dysmenorrhea 05/30/2019   History of gestational hypertension 10/07/2017    Plan  1. Admit to L&D :   2. EFM:-- Category 1 3. IV pain medication or Epidural if desired.   4. Admission labs  5. Hypertensive protocol order as needed 6. Anticipate NSVD 7. Dr. Valentino Saxon notified of admission   Doreene Burke, [place]  06/14/2023 1:16 AM

## 2023-06-14 NOTE — Anesthesia Preprocedure Evaluation (Signed)
Anesthesia Evaluation  Patient identified by MRN, date of birth, ID band Patient awake    Reviewed: Allergy & Precautions, NPO status , Patient's Chart, lab work & pertinent test results  Airway Mallampati: III  TM Distance: >3 FB Neck ROM: full    Dental  (+) Chipped   Pulmonary neg pulmonary ROS   Pulmonary exam normal        Cardiovascular Exercise Tolerance: Good hypertension, negative cardio ROS Normal cardiovascular exam     Neuro/Psych    GI/Hepatic negative GI ROS,,,  Endo/Other    Renal/GU   negative genitourinary   Musculoskeletal   Abdominal   Peds  Hematology negative hematology ROS (+)   Anesthesia Other Findings Past Medical History: 03/17/2023: [redacted] weeks gestation of pregnancy 06/06/2021: Acute focal neurological deficit 05/28/2020: ADD (attention deficit disorder) No date: Allergy 03/20/2020: Body mass index (BMI) of 31.0-31.9 in adult 03/20/2020: Fatigue 06/17/2021: High risk medication use No date: MS (multiple sclerosis) (HCC) No date: Numbness and tingling of right lower extremity No date: Ovarian cyst 2018: Preeclampsia 03/20/2020: Screening for blood or protein in urine 03/20/2020: Tenderness of female pelvic organs 03/20/2020: Vitamin D deficiency 03/20/2020: Weight gain  Past Surgical History: No date: WISDOM TOOTH EXTRACTION     Comment:  four;  teens     Reproductive/Obstetrics (+) Pregnancy                             Anesthesia Physical Anesthesia Plan  ASA: 2  Anesthesia Plan: Epidural   Post-op Pain Management:    Induction:   PONV Risk Score and Plan: 2  Airway Management Planned: Natural Airway  Additional Equipment:   Intra-op Plan:   Post-operative Plan:   Informed Consent: I have reviewed the patients History and Physical, chart, labs and discussed the procedure including the risks, benefits and alternatives for the proposed  anesthesia with the patient or authorized representative who has indicated his/her understanding and acceptance.     Dental Advisory Given  Plan Discussed with: Anesthesiologist  Anesthesia Plan Comments: (Patient reports no bleeding problems and no anticoagulant use.   Patient consented for risks of anesthesia including but not limited to:  - adverse reactions to medications - risk of bleeding, infection and or nerve damage from epidural that could lead to paralysis - risk of headache or failed epidural - nerve damage due to positioning - that if epidural is used for C-section that there is a chance of epidural failure requiring spinal placement or conversion to GA - Damage to heart, brain, lungs, other parts of body or loss of life  Patient voiced understanding.)       Anesthesia Quick Evaluation

## 2023-06-14 NOTE — Progress Notes (Signed)
External fetal wireless monitors in room stopped working. External wired fetal monitors reapplied to patient's abdomen at 0411.

## 2023-06-14 NOTE — Anesthesia Procedure Notes (Signed)
Epidural Patient location during procedure: OB Start time: 06/14/2023 1:58 AM End time: 06/14/2023 2:15 AM  Staffing Anesthesiologist: Stephanie Coup, MD Performed: anesthesiologist   Preanesthetic Checklist Completed: patient identified, IV checked, site marked, risks and benefits discussed, surgical consent, monitors and equipment checked, pre-op evaluation and timeout performed  Epidural Patient position: sitting Prep: Betadine Patient monitoring: heart rate, continuous pulse ox and blood pressure Approach: midline Location: L3-L4 Injection technique: LOR saline  Needle:  Needle type: Tuohy  Needle gauge: 18 G Needle length: 9 cm and 9 Needle insertion depth: 5 cm Catheter type: closed end flexible Catheter size: 20 Guage Catheter at skin depth: 10 cm Test dose: negative and 1.5% lidocaine with Epi 1:200 K  Assessment Sensory level: T4 Events: blood not aspirated, no cerebrospinal fluid, injection not painful, no injection resistance, no paresthesia and negative IV test  Additional Notes 1 attempt Pt. Evaluated and documentation done after procedure finished. Patient identified. Risks/Benefits/Options discussed with patient including but not limited to bleeding, infection, nerve damage, paralysis, failed block, incomplete pain control, headache, blood pressure changes, nausea, vomiting, reactions to medication both or allergic, itching and postpartum back pain. Confirmed with bedside nurse the patient's most recent platelet count. Confirmed with patient that they are not currently taking any anticoagulation, have any bleeding history or any family history of bleeding disorders. Patient expressed understanding and wished to proceed. All questions were answered. Sterile technique was used throughout the entire procedure. Please see nursing notes for vital signs. Test dose was given through epidural catheter and negative prior to continuing to dose epidural or start infusion.  Warning signs of high block given to the patient including shortness of breath, tingling/numbness in hands, complete motor block, or any concerning symptoms with instructions to call for help. Patient was given instructions on fall risk and not to get out of bed. All questions and concerns addressed with instructions to call with any issues or inadequate analgesia.    Patient tolerated the insertion well without immediate complications. Reason for block:procedure for pain

## 2023-06-14 NOTE — Progress Notes (Addendum)
EFM temporarily removed for epidural placement. FHT audible at 135bpm prior to removal.

## 2023-06-14 NOTE — Lactation Note (Signed)
This note was copied from a baby's chart. Lactation Consultation Note  Patient Name: Jessica Mcintosh Date: 06/14/2023 Age:31 hours Reason for consult: Initial assessment;Term   Maternal Data This is mom's 2nd baby, vaginal, vacuum extraction. Mom with a history of gestational hypertension, PCOS, obesity, MS(recent onset), preeclampsia with her 1st baby.  On initial visit mom reports baby just completed her 2nd feed, latched at both breasts. Mom concerned baby has some discomfort on the top of her head from the vacuum extractor so she is using football hold to position the baby at the breast. Has patient been taught Hand Expression?: Yes Does the patient have breastfeeding experience prior to this delivery?: Yes How long did the patient breastfeed?: 3 weeks  Feeding Mother's Current Feeding Choice: Breast Milk   Interventions Interventions: Breast feeding basics reviewed;Hand express;Education Reviewed what to expect the first days when breastfeeding: how to know the baby is getting enough, 8-12 feeds in 24 hours, feeding cues, cluster feeding. LC number listed on mom's white board. Mom understands if she needs LC assistance to call [facility] and/or care nurse as needed.  Discharge Pump:  (Mom does not have a breastpump at home. Mom would like a manual Harmony pump prior to discharge.)  Consult Status Consult Status: Follow-up  Update provided to care nurse.  Fuller Song 06/14/2023, 12:57 PM

## 2023-06-14 NOTE — Progress Notes (Signed)
LABOR NOTE   Jessica Mcintosh 32 y.o.GP@ at [redacted]w[redacted]d  SUBJECTIVE:  Pt feeling uncomfortable with vaginal pain /pressure Analgesia: Epidural RN called to notify of fetal tachycardia. Iv bolus given and tylenol given.  OBJECTIVE:  BP (!) 132/53   Pulse (!) 102   Temp (!) 102.7 F (39.3 C) (Oral) Comment: [place] Sink, MD and Janee Morn, CNM of temperature  LMP 09/09/2022 (Exact Date)   SpO2 99%  Total I/O In: -  Out: 80 [Urine:80]  She has shown cervical change. CERVIX: 7-8 cm per RN exam : SVE:   Dilation: 10 Effacement (%): 80 Station: -1 Exam by:: Janee Morn, CNM CONTRACTIONS: regular, every 2 minutes FHR: Fetal heart tracing reviewed. Baseline: 175 bpm, Variability: Good {> 6 bpm), Accelerations: Non-reactive but appropriate for gestational age, and Decelerations: Absent Category II    Labs: Lab Results  Component Value Date   WBC 14.5 (H) 06/14/2023   HGB 11.7 (L) 06/14/2023   HCT 36.4 06/14/2023   MCV 85.2 06/14/2023   PLT 270 06/14/2023    ASSESSMENT: 1) Labor curve reviewed.       Progress: Active phase labor.     Membranes: ruptured       Principal Problem:   Labor and delivery, indication for care Gestational hypertension  PLAN: IV fluid bolus and tylenol, and position changes. PT tempeture  100(37.8).   Doreene Burke, CNM 06/14/2023 6:24 AM

## 2023-06-14 NOTE — Progress Notes (Signed)
LABOR NOTE   Jessica Mcintosh 32 y.o.GP@ at [redacted]w[redacted]d  SUBJECTIVE:  Pt continues to feel pressure and vaginal pain.  Analgesia: Epidural  OBJECTIVE:  BP (!) 132/53   Pulse (!) 102   Temp (!) 102.7 F (39.3 C) (Oral) Comment: [place] Sink, MD and Janee Morn, CNM of temperature  LMP 09/09/2022 (Exact Date)   SpO2 99%  Total I/O In: -  Out: 80 [Urine:80]  She has shown cervical change. CERVIX: 8-9cm:  100%:   -2:   mid position:   soft SVE:   Dilation: 10 Effacement (%): 80 Station: -1 Exam by:: Janee Morn, CNM CONTRACTIONS: regular, every 2 minutes FHR: Fetal heart tracing reviewed. Baseline: 185 bpm, Variability: Good {> 6 bpm), Accelerations: Non-reactive but appropriate for gestational age, and Decelerations: variable and late with pushing  Category II    Labs: Lab Results  Component Value Date   WBC 14.5 (H) 06/14/2023   HGB 11.7 (L) 06/14/2023   HCT 36.4 06/14/2023   MCV 85.2 06/14/2023   PLT 270 06/14/2023    ASSESSMENT: 1) Labor curve reviewed.       Progress: Active phase labor.     Membranes: ruptured, bloody, thin      Principal Problem:   Labor and delivery, indication for care Gestational hypertension GBS positive  PLAN: Dr. Valentino Saxon notified of strip and fetal tachycardia. Given tempeture 100 F, no antibiotics at this time. Discussed plan of pushing with pt and attempting  to reduce cervix with pushing. Dr. Valentino Saxon in agreement.    Doreene Burke, CNM 06/14/2023 6:31 AM

## 2023-06-14 NOTE — Progress Notes (Signed)
LABOR NOTE   Jessica Mcintosh 32 y.o.GP@ at [redacted]w[redacted]d  SUBJECTIVE:  Pt feeling pressure and urge to push. Dr. Valentino Saxon at the bed side to evaluate pt.  Analgesia: Epidural  OBJECTIVE:  BP 117/63 (BP Location: Left Arm)   Pulse (!) 136   Temp (!) 100.9 F (38.3 C) (Oral)   Resp 18   Ht 5\' 1"  (1.549 m)   Wt 86.2 kg   LMP 09/09/2022 (Exact Date)   SpO2 96%   Breastfeeding Unknown   BMI 35.90 kg/m  No intake/output data recorded.  She has shown cervical change. CERVIX: 10/100/0:  SVE:   Dilation: 10 Effacement (%): 80 Station: -1 Exam by:: Janee Morn, CNM CONTRACTIONS: regular, every 2-3 minutes FHR: Fetal heart tracing reviewed. Baseline: 175 bpm, Variability: Fair (1-6 bpm), Accelerations: Non-reactive but appropriate for gestational age, and Decelerations: early and late with pushing   Category II    Labs: Lab Results  Component Value Date   WBC 14.5 (H) 06/14/2023   HGB 11.7 (L) 06/14/2023   HCT 36.4 06/14/2023   MCV 85.2 06/14/2023   PLT 270 06/14/2023    ASSESSMENT: 1) Labor curve reviewed.       Progress: Active phase labor.     Membranes: ruptured, FSE       Principal Problem:   Labor and delivery, indication for care Gestational hypertension  PLAN: Dr. Valentino Saxon at the bedside evaluating pushing and progress. Fetal station has improved with laboring down .   Doreene Burke, CNM   06/14/2023 8:34 AM

## 2023-06-14 NOTE — Progress Notes (Signed)
LABOR NOTE   Jessica Mcintosh 32 y.o.GP@ at [redacted]w[redacted]d  SUBJECTIVE:  Pt pushing , giving good effort. She states she does not feel well and is weak.  Analgesia: Epidural  OBJECTIVE:  BP (!) 132/53   Pulse (!) 102   Temp (!) 102.7 F (39.3 C) (Oral) Comment: [place] Sink, MD and Janee Morn, CNM of temperature  LMP 09/09/2022 (Exact Date)   SpO2 99%  Total I/O In: -  Out: 80 [Urine:80]  She has shown cervical change. CERVIX: 10cm :  100%:   -2:   mid position:    SVE:   Dilation: 10 Effacement (%): 80 Station: -1 Exam by:: Janee Morn, CNM CONTRACTIONS: regular, every 2 minutes FHR: Fetal heart tracing reviewed. Baseline: 190 bpm, Variability: Fair (1-6 bpm), Accelerations: Non-reactive but appropriate for gestational age, and Decelerations: Absent Category II    Labs: Lab Results  Component Value Date   WBC 14.5 (H) 06/14/2023   HGB 11.7 (L) 06/14/2023   HCT 36.4 06/14/2023   MCV 85.2 06/14/2023   PLT 270 06/14/2023    ASSESSMENT: 1) Labor curve reviewed.       Progress: Active phase labor.     Membranes: ruptured, FSE placed  Principal Problem:   Labor and delivery, indication for care Gestational hypertension Chorioamnionitis   PLAN: RN notified DR. Cherry (per my request) of increase in fetal heart rate 195-200 with decreased  in variability. PT now febrile with tempeture 102.7 F. Discussed concerns with Dr. Valentino Saxon. Requested her present at the bedside. She recommends starting antibiotics for chorioamnionitis , stop pushing for 30 minutes  to allow antibiotics to infuse . Antibiotics order stat. Pharmacy notified . Awaiting from pharmacy.  Pt to her right lateral position.   Doreene Burke, CNM  06/14/2023 6:39 AM

## 2023-06-14 NOTE — Progress Notes (Signed)
[place]uel[place] Sink, MD via phone at [place], [place] request. Informed Valentino Saxon, MD of patient's elevated temp (102.7) and FHR tachycardic (195-200). Notified Valentino Saxon, MD of Janee Morn, CNM's request to evaluate in person. Valentino Saxon, MD recommends antibiotics to be ordered for temperature, stop pushing, and to re-evaluate in 30 minutes at this time. Pharmacy notified of STAT order for antibiotics. Awaiting antibiotics to be tubed at this time.

## 2023-06-15 LAB — CBC
HCT: 28.1 % — ABNORMAL LOW (ref 36.0–46.0)
Hemoglobin: 9.1 g/dL — ABNORMAL LOW (ref 12.0–15.0)
MCH: 27.2 pg (ref 26.0–34.0)
MCHC: 32.4 g/dL (ref 30.0–36.0)
MCV: 83.9 fL (ref 80.0–100.0)
Platelets: 211 10*3/uL (ref 150–400)
RBC: 3.35 MIL/uL — ABNORMAL LOW (ref 3.87–5.11)
RDW: 14.1 % (ref 11.5–15.5)
WBC: 23.6 10*3/uL — ABNORMAL HIGH (ref 4.0–10.5)
nRBC: 0 % (ref 0.0–0.2)

## 2023-06-15 NOTE — Anesthesia Postprocedure Evaluation (Signed)
Anesthesia Post Note  Patient: Health and safety inspector  Procedure(s) Performed: AN AD HOC LABOR EPIDURAL  Patient location during evaluation: Mother Baby Anesthesia Type: Epidural Level of consciousness: awake and alert Pain management: pain level controlled Vital Signs Assessment: post-procedure vital signs reviewed and stable Respiratory status: spontaneous breathing, nonlabored ventilation and respiratory function stable Cardiovascular status: stable Postop Assessment: no headache, no backache and epidural receding Anesthetic complications: no   No notable events documented.   Last Vitals:  Vitals:   06/15/23 0258 06/15/23 0800  BP: 118/66 125/67  Pulse: 82 94  Resp: 18 16  Temp: 36.8 C 36.6 C  SpO2: 99% 99%    Last Pain:  Vitals:   06/15/23 0800  TempSrc: Oral  PainSc: 5                  Mathews Argyle P

## 2023-06-15 NOTE — Progress Notes (Signed)
Subjective:   Jessica Mcintosh had a VAVD at 810 06/14/23. Her labor was complicated by chorioamnionitis, FHR decelerations. Has had routine postpartum care.  Pt. Is eating, hydrating, and voiding regularly without difficulty. Has yet to have BM. She is breastfeeding. Reports mild/moderate vaginal bleeding, denies passing large blood clots. Has had cramping abdomen pain relieved with tylenol/ibuprofen. Undecided on postpartum contraception. Denies anxiety/depression symptoms. Endorses good support from partner and family.   Objective:  Vital signs in last 24 hours: Temp:  [97.8 F (36.6 C)-99 F (37.2 C)] 97.8 F (36.6 C) (08/27 0800) Pulse Rate:  [82-103] 94 (08/27 0800) Resp:  [16-20] 16 (08/27 0800) BP: (104-125)/(58-68) 125/67 (08/27 0800) SpO2:  [98 %-100 %] 99 % (08/27 0800)    General: NAD Pulmonary: no increased work of breathing Breasts: soft, non-tender, nipples without breakdown Abdomen: soft, non-tender Fundus: firm, midline, at umbilicus Lochia: light rubra, no clots Perineum: no erythema or foul odor discharge, minimal edema, laceration well approximated  Extremities: no edema, no erythema, no tenderness  Results for orders placed or performed during the hospital encounter of 06/14/23 (from the past 72 hour(s))  CBC     Status: Abnormal   Collection Time: 06/14/23  1:29 AM  Result Value Ref Range   WBC 14.5 (H) 4.0 - 10.5 K/uL   RBC 4.27 3.87 - 5.11 MIL/uL   Hemoglobin 11.7 (L) 12.0 - 15.0 g/dL   HCT 40.9 81.1 - 91.4 %   MCV 85.2 80.0 - 100.0 fL   MCH 27.4 26.0 - 34.0 pg   MCHC 32.1 30.0 - 36.0 g/dL   RDW 78.2 95.6 - 21.3 %   Platelets 270 150 - 400 K/uL   nRBC 0.0 0.0 - 0.2 %    Comment: Performed at [facility], [address] [place], [place] [address]  Protein / creatinine ratio, urine     Status: None   Collection Time: 06/14/23  1:29 AM  Result Value Ref Range   Creatinine, Urine 154 mg/dL   Total Protein, Urine 18 mg/dL    Comment: NO  NORMAL RANGE ESTABLISHED FOR THIS TEST   Protein Creatinine Ratio 0.12 0.00 - 0.15 mg/mg[Cre]    Comment: Performed at [facility], [address] [place], [place] [address]  Type and screen [facility]     Status: None   Collection Time: 06/14/23  1:46 AM  Result Value Ref Range   ABO/RH(D) O POS    Antibody Screen NEG    Sample Expiration      06/17/2023,2359 Performed at [facility], [address] [place], [place] [address]   Comprehensive metabolic panel     Status: Abnormal   Collection Time: 06/14/23  1:46 AM  Result Value Ref Range   Sodium 134 (L) 135 - 145 mmol/L   Potassium 3.7 3.5 - 5.1 mmol/L   Chloride 106 98 - 111 mmol/L   CO2 19 (L) 22 - 32 mmol/L   Glucose, Bld 98 70 - 99 mg/dL    Comment: Glucose reference range applies only to samples taken after fasting for at least 8 hours.   BUN 7 6 - 20 mg/dL   Creatinine, Ser 4.13 0.44 - 1.00 mg/dL   Calcium 8.6 (L) 8.9 - 10.3 mg/dL   Total Protein 6.6 6.5 - 8.1 g/dL   Albumin 2.7 (L) 3.5 - 5.0 g/dL   AST 24 15 - 41 U/L   ALT 14 0 - 44 U/L   Alkaline Phosphatase  185 (H) 38 - 126 U/L   Total Bilirubin 0.3 0.3 - 1.2 mg/dL   GFR, Estimated >44 >01 mL/min    Comment: (NOTE) Calculated using the CKD-EPI Creatinine Equation (2021)    Anion gap 9 5 - 15    Comment: Performed at [facility], [address] [place], [place] [address]  RPR     Status: None   Collection Time: 06/14/23  1:46 AM  Result Value Ref Range   RPR Ser Ql NON REACTIVE NON REACTIVE    Comment: Performed at [facility], [address], [place], [place] [address]  CBC     Status: Abnormal   Collection Time: 06/15/23  5:39 AM  Result Value Ref Range   WBC 23.6 (H) 4.0 - 10.5 K/uL   RBC 3.35 (L) 3.87 - 5.11 MIL/uL   Hemoglobin 9.1 (L) 12.0 - 15.0 g/dL   HCT 03.4 (L) 74.2 - 59.5 %   MCV 83.9 80.0 - 100.0 fL   MCH 27.2 26.0 - 34.0 pg   MCHC 32.4 30.0 - 36.0 g/dL   RDW 63.8 75.6 - 43.3 %    Platelets 211 150 - 400 K/uL   nRBC 0.0 0.0 - 0.2 %    Comment: Performed at [facility], [address], [place], [place] [address]    Assessment:   32 y.o. [phone] 1 day(s)  s/p NSVB Breastfeeding Anemia secondary to acute blood loss- hemodynamically stable and asymptomatic VSS, afebrile Pain well controlled Leukocytosis  Plan:    PO Fe Blood Type --/--/O POS (08/26 0146) / Rubella 2.58 (03/01 0901) / Varicella Immune Rhogam not indicated Tdap/varicella/rubella to be offered before discharge if indicated Feeding plan breast, lactation support Encouraged to continue breastfeeding, BF education on latch, position changes, cluster feeding, hunger cues, lactogenesis II, milk supply Undecided on postpartum contraception Continued routine postpartum care  Counseled on normal uterine involution and vaginal bleeding postpartum Repeat CBC 8/28 to ensure downtrending, continue to follow for signs of infection Anticipate discharge home tomorrow    Dominica Severin, CNM [facility] 06/15/2023, 11:31 AM

## 2023-06-15 NOTE — Lactation Note (Signed)
This note was copied from a baby's chart. Lactation Consultation Note  Patient Name: Jessica Mcintosh [id number] Date: 06/15/2023 Age:31 hours Reason for consult: Follow-up assessment;Term   Maternal Data This is mom's 2nd baby, vaginal, vacuum extraction. Mom with a history of gestational hypertension, PCOS, obesity, MS(recent onset), preeclampsia with her 1st baby.   Today on follow-up mom reports baby was cluster feeding through the night and was sleepier this morning, however, with mom's strategies for waking the baby (opened the blanket, changed the diaper, tactile stimulation) baby did awaken and feed. Mom reporting baby has been latching better. Mom is happy baby's head from the vacuum extraction appears better. Has patient been taught Hand Expression?: Yes How long did the patient breastfeed?: 3 weeks  Feeding Mother's Current Feeding Choice: Breast Milk   Lactation Tools Discussed/Used  Harmony hand pump use, reviewed cleaning of parts, milk storage guidelines.  Interventions Interventions: Breast feeding basics reviewed;Hand pump;Education  Discharge Discharge Education: Engorgement and breast care;Warning signs for feeding baby;Outpatient recommendation Pump: Personal Per mom dad did find the personal breast pump DEBP at home.   Consult Status Consult Status: PRN  Update provided to care nurse.  Fuller Song 06/15/2023, 3:20 PM

## 2023-06-16 LAB — CBC
HCT: 26.4 % — ABNORMAL LOW (ref 36.0–46.0)
Hemoglobin: 8.6 g/dL — ABNORMAL LOW (ref 12.0–15.0)
MCH: 27.7 pg (ref 26.0–34.0)
MCHC: 32.6 g/dL (ref 30.0–36.0)
MCV: 84.9 fL (ref 80.0–100.0)
Platelets: 253 10*3/uL (ref 150–400)
RBC: 3.11 MIL/uL — ABNORMAL LOW (ref 3.87–5.11)
RDW: 14.4 % (ref 11.5–15.5)
WBC: 18.3 10*3/uL — ABNORMAL HIGH (ref 4.0–10.5)
nRBC: 0 % (ref 0.0–0.2)

## 2023-06-16 MED ORDER — IBUPROFEN 600 MG PO TABS: 600.0000 mg | ORAL_TABLET | Freq: Four times a day (QID) | ORAL | 0 refills | Status: DC | PRN

## 2023-06-16 MED ORDER — ACETAMINOPHEN 500 MG PO TABS
1000.0000 mg | ORAL_TABLET | Freq: Four times a day (QID) | ORAL | Status: DC
  Administered 2023-06-16: 1000 mg via ORAL
  Filled 2023-06-16: qty 2

## 2023-06-16 NOTE — Discharge Summary (Signed)
Postpartum Discharge Summary  Date of Service updated 06/16/2023     Patient Name: Jessica Mcintosh DOB: 1990/12/24 MRN: [id number]  Date of admission: 06/14/2023 Delivery date:06/14/2023 Delivering provider: Hildred Laser Date of discharge: 06/16/2023  Admitting diagnosis: Labor and delivery, indication for care [O75.9] Intrauterine pregnancy: [redacted]w[redacted]d     Secondary diagnosis:  Principal Problem:   Labor and delivery, indication for care Active Problems:   Postpartum care following vaginal delivery  Additional problems: hypertension    Discharge diagnosis: Term Pregnancy Delivered  Vacuum assisted.                                            Post partum procedures: none Augmentation: N/A Complications: chorioamnionitis  Hospital course: Induction of Labor With Vaginal Delivery   33 y.o. yo [id number] at [redacted]w[redacted]d was admitted to the hospital 06/14/2023 for induction of labor.  Indication for induction: Gestational hypertension.  Patient had an labor course complicated bya category 2 strip, and some fetal bradycardia in second stage. The delivery was vacuum assisted. Chorioamnionitis also diagnosed. Membrane Rupture Time/Date: 10:00 PM,06/13/2023  Delivery Method:Vaginal, Vacuum (Extractor) Operative Delivery:Device used:vacuum, Kiwi Indication: Fetal indications Episiotomy: None Lacerations:  None Details of delivery can be found in separate delivery note.  Patient had a postpartum course complicated byrare elevated blood pressures. Patient is discharged home 06/16/23.  Newborn Data: Birth date:06/14/2023 Birth time:8:10 AM Gender:Female Living status:Living Apgars:8 ,9  Weight:3640 g  Magnesium Sulfate received: No BMZ received: No Rhophylac:N/A MMR:No T-DaP:Given prenatally Flu: No Transfusion:No  Physical exam  Vitals:   06/15/23 2300 06/16/23 0302 06/16/23 0751 06/16/23 1131  BP: 119/62 (!) 144/76 127/68 125/69  Pulse: 89 80 87 79  Resp: 20 18 20 20   Temp: 99 F (37.2  C) 98.7 F (37.1 C) 98.1 F (36.7 C) 98.6 F (37 C)  TempSrc: Oral Oral Oral Oral  SpO2: 98% 97% 98%   Weight:      Height:       General: alert, cooperative, and no distress Lochia: appropriate Uterine Fundus: firm Incision: Healing well with no significant drainage DVT Evaluation: No evidence of DVT seen on physical exam. Negative Homan's sign. Calf/Ankle edema is present Labs: Lab Results  Component Value Date   WBC 18.3 (H) 06/16/2023   HGB 8.6 (L) 06/16/2023   HCT 26.4 (L) 06/16/2023   MCV 84.9 06/16/2023   PLT 253 06/16/2023      Latest Ref Rng & Units 06/14/2023    1:46 AM  CMP  Glucose 70 - 99 mg/dL 98   BUN 6 - 20 mg/dL 7   Creatinine 9.56 - 2.13 mg/dL 0.86   Sodium 578 - 469 mmol/L 134   Potassium 3.5 - 5.1 mmol/L 3.7   Chloride 98 - 111 mmol/L 106   CO2 22 - 32 mmol/L 19   Calcium 8.9 - 10.3 mg/dL 8.6   Total Protein 6.5 - 8.1 g/dL 6.6   Total Bilirubin 0.3 - 1.2 mg/dL 0.3   Alkaline Phos 38 - 126 U/L 185   AST 15 - 41 U/L 24   ALT 0 - 44 U/L 14    Edinburgh Score:    06/15/2023    8:54 AM  Edinburgh Postnatal Depression Scale Screening Tool  I have been able to laugh and see the funny side of things. 0  I have looked forward with enjoyment to things.  0  I have blamed myself unnecessarily when things went wrong. 1  I have been anxious or worried for no good reason. 1  I have felt scared or panicky for no good reason. 1  Things have been getting on top of me. 1  I have been so unhappy that I have had difficulty sleeping. 0  I have felt sad or miserable. 0  I have been so unhappy that I have been crying. 0  The thought of harming myself has occurred to me. 0  Edinburgh Postnatal Depression Scale Total 4      After visit meds:  Allergies as of 06/16/2023       Reactions   Aspirin Hives, Itching, Rash, Shortness Of Breath, Swelling   Penicillins Hives, Rash        Medication List     TAKE these medications    ibuprofen 600 MG  tablet Commonly known as: ADVIL Take 1 tablet (600 mg total) by mouth every 6 (six) hours as needed for fever or headache.   multivitamin-prenatal 27-0.8 MG Tabs tablet Take 1 tablet by mouth daily at 12 noon.         Discharge home in stable condition Infant Feeding: Breast Infant Disposition:home with mother Discharge instruction: per After Visit Summary and Postpartum booklet. Activity: Advance as tolerated. Pelvic rest for 6 weeks.  Diet: routine diet Anticipated Birth Control: IUD Postpartum Appointment:6 weeks Additional Postpartum F/U: BP check 2 weeks Future Appointments: Future Appointments  Date Time Provider Department Center  06/17/2023 10:55 AM Tresea Mall, CNM [facility] None   Follow up Visit:  Follow-up Information     [facility]. Schedule an appointment as soon as possible for a visit in 2 week(s).   Why: Please make two appointments: one in 2 weeks for a blood pressure check, and the second in 6 weeks for your physical. Contact information: [address] [place] [address] [phone]                    06/16/2023 Mirna Mires, CNM

## 2023-06-16 NOTE — Lactation Note (Signed)
This note was copied from a baby's chart. Lactation Consultation Note  Patient Name: Jessica Mcintosh [id number] Date: 06/16/2023 Age:31 hours Reason for consult: Follow-up assessment;Term   Maternal Data This is mom's 2nd baby, vaginal, vacuum extraction. Mom with a history of gestational hypertension, PCOS, obesity, MS(recent onset), preeclampsia with her 1st baby.  On follow-up today mom reports baby was cluster feeding throughout the night. Mom is feeding baby on cue and was wondering if she could overfeed the baby from the breast. Mom's nipples are tender, intact. Mom using coconut oil to nipples. Has patient been taught Hand Expression?: Yes Does the patient have breastfeeding experience prior to this delivery?: Yes How long did the patient breastfeed?: 3 weeks  Feeding Mother's Current Feeding Choice: Breast Milk  Discussed with mom what to expect in the first days when breastfeeding and reassured mom she is not overfeeding baby at the breast.Discussed normative milk volumes in the first 3 days baby takes per feeding. Reviewed with mom how to know the baby is getting enough.  Interventions Interventions: Breast feeding basics reviewed;Education  Discharge Discharge Education: Engorgement and breast care;Outpatient recommendation;Warning signs for feeding baby Pump: Manual;Personal  Consult Status Consult Status: Complete Date: 06/16/23  Update provided to care nurse.  Fuller Song 06/16/2023, 12:24 PM

## 2023-06-16 NOTE — Progress Notes (Signed)
Pt discharged with infant.  Discharge instructions, prescriptions and follow up appointment given to and reviewed with pt. Pt verbalized understanding. Escorted out by auxillary. 

## 2023-06-17 ENCOUNTER — Encounter: Payer: Medicaid Other | Admitting: Advanced Practice Midwife

## 2023-06-24 ENCOUNTER — Telehealth: Payer: Self-pay

## 2023-06-30 ENCOUNTER — Encounter: Payer: Self-pay | Admitting: Obstetrics and Gynecology

## 2023-06-30 ENCOUNTER — Ambulatory Visit (INDEPENDENT_AMBULATORY_CARE_PROVIDER_SITE_OTHER): Payer: Medicaid Other | Admitting: Obstetrics and Gynecology

## 2023-06-30 DIAGNOSIS — Z1332 Encounter for screening for maternal depression: Secondary | ICD-10-CM | POA: Diagnosis not present

## 2023-06-30 DIAGNOSIS — O163 Unspecified maternal hypertension, third trimester: Secondary | ICD-10-CM

## 2023-06-30 NOTE — Progress Notes (Signed)
HPI:      [name] is a 32 y.o. [phone] who LMP was No LMP recorded.  Subjective:   She presents today 2 weeks postpartum.  She is breast-feeding.  She reports no issues.  At the time of discharge after birth she had a slightly elevated blood pressure and had gestational hypertension at the end of her pregnancy.  She is not currently taking any medications.    Hx: The following portions of the patient's history were reviewed and updated as appropriate:             She  has a past medical history of [redacted] weeks gestation of pregnancy (03/17/2023), Acute focal neurological deficit (06/06/2021), ADD (attention deficit disorder) (05/28/2020), Allergy, Body mass index (BMI) of 31.0-31.9 in adult (03/20/2020), Fatigue (03/20/2020), High risk medication use (06/17/2021), MS (multiple sclerosis) (HCC), Numbness and tingling of right lower extremity, Ovarian cyst, Preeclampsia (2018), Screening for blood or protein in urine (03/20/2020), Tenderness of female pelvic organs (03/20/2020), Vitamin D deficiency (03/20/2020), and Weight gain (03/20/2020). She does not have any pertinent problems on file. She  has a past surgical history that includes Wisdom tooth extraction. Her family history includes Alcoholism in her father; Alzheimer's disease in her maternal grandmother; Breast cancer in her maternal grandmother and paternal grandmother; COPD in her mother; Dementia in her paternal grandmother; Depression in her maternal aunt, maternal grandmother, and mother; Glaucoma in her paternal grandmother; Hypertension in her father, maternal aunt, mother, and paternal grandmother; Lung cancer in her maternal grandfather and paternal uncle; Parkinson's disease in her paternal grandfather; Skin cancer in her paternal grandfather. She  reports that she has never smoked. She has never used smokeless tobacco. She reports that she does not drink alcohol and does not use drugs. She has a current medication list which  includes the following prescription(s): ibuprofen and multivitamin-prenatal. She is allergic to aspirin and penicillins.       Review of Systems:  Review of Systems  Constitutional: Denied constitutional symptoms, night sweats, recent illness, fatigue, fever, insomnia and weight loss.  Eyes: Denied eye symptoms, eye pain, photophobia, vision change and visual disturbance.  Ears/Nose/Throat/Neck: Denied ear, nose, throat or neck symptoms, hearing loss, nasal discharge, sinus congestion and sore throat.  Cardiovascular: Denied cardiovascular symptoms, arrhythmia, chest pain/pressure, edema, exercise intolerance, orthopnea and palpitations.  Respiratory: Denied pulmonary symptoms, asthma, pleuritic pain, productive sputum, cough, dyspnea and wheezing.  Gastrointestinal: Denied, gastro-esophageal reflux, melena, nausea and vomiting.  Genitourinary: Denied genitourinary symptoms including symptomatic vaginal discharge, pelvic relaxation issues, and urinary complaints.  Musculoskeletal: Denied musculoskeletal symptoms, stiffness, swelling, muscle weakness and myalgia.  Dermatologic: Denied dermatology symptoms, rash and scar.  Neurologic: Denied neurology symptoms, dizziness, headache, neck pain and syncope.  Psychiatric: Denied psychiatric symptoms, anxiety and depression.  Endocrine: Denied endocrine symptoms including hot flashes and night sweats.   Meds:   Current Outpatient Medications on File Prior to Visit  Medication Sig Dispense Refill   ibuprofen (ADVIL) 600 MG tablet Take 1 tablet (600 mg total) by mouth every 6 (six) hours as needed for fever or headache. 30 tablet 0   Prenatal Vit-Fe Fumarate-FA (MULTIVITAMIN-PRENATAL) 27-0.8 MG TABS tablet Take 1 tablet by mouth daily at 12 noon.     No current facility-administered medications on file prior to visit.      Objective:     Vitals:   06/30/23 1009  BP: 110/74  Pulse: 93   Filed Weights   06/30/23 1009  Weight: 174 lb 8  oz (  79.2 kg)                        Assessment:    [id number] Patient Active Problem List   Diagnosis Date Noted   Postpartum care following vaginal delivery 06/16/2023   Labor and delivery, indication for care 06/14/2023   Gestational hypertension, third trimester 06/11/2023   [redacted] weeks gestation of pregnancy 06/11/2023   Elevated blood pressure affecting pregnancy in third trimester, antepartum 06/10/2023   Obesity in pregnancy 04/29/2023   Frequent headaches 03/17/2023   Supervision of high risk pregnancy, antepartum 02/24/2023   History of PCOS 02/24/2023   Right leg weakness 06/06/2021   Demyelinating lesion (HCC)    Multiple sclerosis (HCC)    Dysmenorrhea 05/30/2019   History of gestational hypertension 10/07/2017     1. Postpartum care following vaginal delivery   2. Elevated blood pressure affecting pregnancy in third trimester, antepartum   3. Encounter for screening for maternal depression     Patient with excellent recovery postpartum.  She is having no issues.  Her blood pressure is normal without antihypertensive.   Plan:            1.  Continue normal postpartum course.  Follow-up at 6 weeks.  2.  We have discussed birth control methods and patient is concerned regarding her MS and choosing an appropriate method.  She is most interested in Mirena IUD at this time.  She hopes to get this around her 6-week appointment.  Orders No orders of the defined types were placed in this encounter.   No orders of the defined types were placed in this encounter.     F/U  Return in about 4 weeks (around 07/28/2023).  Elonda Husky, M.D. 06/30/2023 10:26 AM

## 2023-06-30 NOTE — Progress Notes (Signed)
Patient presents today for 2 week postpartum follow-up. Patient had a vaginal delivery on 06/14/23.  Mom is breast feeding. She states she would like IUD for birth control. EPDS score of 2. She states no other questions or concerns at this time.

## 2023-07-27 NOTE — Progress Notes (Unsigned)
   OBSTETRICS POSTPARTUM CLINIC PROGRESS NOTE  Subjective:     Jessica Mcintosh is a 32 y.o. G30P2002 female who presents for a postpartum visit. She is 6 week postpartum following a Vaginal, Vacuum Investment banker, operational) delivery. I have fully reviewed the prenatal and intrapartum course. The delivery was at [redacted]w[redacted]d gestational weeks.  Anesthesia: epidural. Postpartum course has been normal. Baby's course has been normal. Baby is feeding by breast. Bleeding: patient hasresumed menses, with Patient's last menstrual period was 07/23/2023.Marland Kitchen Bowel function is normal. Bladder function is normal. Patient is not sexually active. Contraception method desired is  consideration of anIUD and for now POPs.  Marland Kitchenshe wants to discuss the iUD option with her husband. Jessica Mcintosh has MS, and has been careful about what medications she is on regularly Postpartum depression screening: negative.  EDPS score is 2.   Her delivery was a vacuum assist due to less reassuring FHTS. She delivered over an intact perineum. She is breastfeeding without supplementation.   The following portions of the patient's history were reviewed and updated as appropriate: allergies, current medications, past family history, past medical history, past social history, past surgical history, and problem list.  Review of Systems A comprehensive review of systems was negative.   Objective:    BP 99/66   Pulse 80   Ht 5\' 1"  (1.549 m)   Wt 171 lb (77.6 kg)   LMP 07/23/2023   Breastfeeding Yes   BMI 32.31 kg/m   General:  alert and no distress   Breasts:  inspection negative, no nipple discharge or bleeding, no masses or nodularity palpable  Lungs: clear to auscultation bilaterally  Heart:  regular rate and rhythm, S1, S2 normal, no murmur, click, rub or gallop  Abdomen: soft, non-tender; bowel sounds normal; no masses,  no organomegaly.     Vulva:  normal  Vagina: normal vagina, no discharge, exudate, lesion, or erythema, scant bleeding.  Cervix:  no  cervical motion tenderness and no lesions  Corpus: normal size, contour, position, anterior, consistency, mobility, non-tender  Adnexa:  normal adnexa and no mass, fullness,  slight tenderness on her right side.  Rectal Exam: Not performed.         Labs:  Lab Results  Component Value Date   HGB 8.6 (L) 06/16/2023     Assessment:   No diagnosis found.   Plan:    1. Contraception:  After much discussion and a review of IUDs, including the risk and benefits, she is undecided. I have prescribed her ample Micronor in the meantime. She may RTC in 2 weeks for  Mirena. She does have a hx of heavier bleeding and the Mirena is suggested as a means of treating this. 2. Will check Hgb for h/o postpartum anemia of less than 10. CBC drawn  I have sent in an Aptima swab- she has vaginal odor and discharge. 3. Follow up in: 2 weeks  for IUD if desired, or as needed.      Paula Compton, CNM. [facility]

## 2023-07-28 ENCOUNTER — Ambulatory Visit: Payer: Medicaid Other | Admitting: Obstetrics

## 2023-07-28 ENCOUNTER — Encounter: Payer: Self-pay | Admitting: Obstetrics

## 2023-07-28 ENCOUNTER — Other Ambulatory Visit (HOSPITAL_COMMUNITY)
Admission: RE | Admit: 2023-07-28 | Discharge: 2023-07-28 | Disposition: A | Discharge status: Home / Self Care | Payer: Medicaid Other | Source: Ambulatory Visit | Attending: Obstetrics | Admitting: Obstetrics

## 2023-07-28 VITALS — BP 99/66 | HR 80 | Ht 61.0 in | Wt 171.0 lb

## 2023-07-28 DIAGNOSIS — Z30011 Encounter for initial prescription of contraceptive pills: Secondary | ICD-10-CM

## 2023-07-28 DIAGNOSIS — N898 Other specified noninflammatory disorders of vagina: Secondary | ICD-10-CM | POA: Insufficient documentation

## 2023-07-28 MED ORDER — NORETHINDRONE 0.35 MG PO TABS
1.0000 | ORAL_TABLET | Freq: Every day | ORAL | 4 refills | Status: AC
Start: 2023-07-28 — End: ?

## 2023-07-29 LAB — CBC
Hematocrit: 38.5 % (ref 34.0–46.6)
Hemoglobin: 12 g/dL (ref 11.1–15.9)
MCH: 27.5 pg (ref 26.6–33.0)
MCHC: 31.2 g/dL — ABNORMAL LOW (ref 31.5–35.7)
MCV: 88 fL (ref 79–97)
Platelets: 291 10*3/uL (ref 150–450)
RBC: 4.37 x10E6/uL (ref 3.77–5.28)
RDW: 14.2 % (ref 11.7–15.4)
WBC: 6.4 10*3/uL (ref 3.4–10.8)

## 2023-07-29 LAB — CERVICOVAGINAL ANCILLARY ONLY
Bacterial Vaginitis (gardnerella): NEGATIVE
Candida Glabrata: NEGATIVE
Candida Vaginitis: NEGATIVE
Comment: NEGATIVE
Comment: NEGATIVE
Comment: NEGATIVE

## 2023-08-09 NOTE — Progress Notes (Deleted)
    GYNECOLOGY PROGRESS NOTE  Subjective:    Patient ID: Jessica Mcintosh, female    DOB: August 05, 1991, 32 y.o.   MRN: [id number]  HPI  Patient is a 32 y.o. G72P2002 female who presents for placement of IUD   {Common ambulatory SmartLinks:19316}  Review of Systems {ros; complete:30496}   Objective:   Last menstrual period 07/23/2023, currently breastfeeding. There is no height or weight on file to calculate BMI. General appearance: {general exam:16600} Abdomen: {abdominal exam:16834} Pelvic: {pelvic exam:16852::"cervix normal in appearance","external genitalia normal","no adnexal masses or tenderness","no cervical motion tenderness","rectovaginal septum normal","uterus normal size, shape, and consistency","vagina normal without discharge"} Extremities: {extremity exam:5109} Neurologic: {neuro exam:17854}   Assessment:   No diagnosis found.   Plan:   There are no diagnoses linked to this encounter.

## 2023-08-10 ENCOUNTER — Ambulatory Visit: Payer: Medicaid Other | Admitting: Obstetrics

## 2023-08-10 DIAGNOSIS — Z30430 Encounter for insertion of intrauterine contraceptive device: Secondary | ICD-10-CM

## 2023-08-29 ENCOUNTER — Emergency Department
Admission: EM | Admit: 2023-08-29 | Discharge: 2023-08-30 | Disposition: A | Discharge status: Home / Self Care | Payer: Medicaid Other | Attending: Emergency Medicine | Admitting: Emergency Medicine

## 2023-08-29 ENCOUNTER — Emergency Department: Payer: Medicaid Other

## 2023-08-29 DIAGNOSIS — S60221A Contusion of right hand, initial encounter: Secondary | ICD-10-CM | POA: Diagnosis not present

## 2023-08-29 DIAGNOSIS — M25531 Pain in right wrist: Secondary | ICD-10-CM | POA: Insufficient documentation

## 2023-08-29 DIAGNOSIS — S0101XA Laceration without foreign body of scalp, initial encounter: Secondary | ICD-10-CM | POA: Diagnosis not present

## 2023-08-29 DIAGNOSIS — Z23 Encounter for immunization: Secondary | ICD-10-CM | POA: Diagnosis not present

## 2023-08-29 DIAGNOSIS — S0990XA Unspecified injury of head, initial encounter: Secondary | ICD-10-CM

## 2023-08-29 LAB — COMPREHENSIVE METABOLIC PANEL
ALT: 43 U/L (ref 0–44)
AST: 43 U/L — ABNORMAL HIGH (ref 15–41)
Albumin: 4.5 g/dL (ref 3.5–5.0)
Alkaline Phosphatase: 119 U/L (ref 38–126)
Anion gap: 10 (ref 5–15)
BUN: 14 mg/dL (ref 6–20)
CO2: 20 mmol/L — ABNORMAL LOW (ref 22–32)
Calcium: 9 mg/dL (ref 8.9–10.3)
Chloride: 107 mmol/L (ref 98–111)
Creatinine, Ser: 0.86 mg/dL (ref 0.44–1.00)
GFR, Estimated: >60 mL/min (ref >60)
Glucose, Bld: 130 mg/dL — ABNORMAL HIGH (ref 70–99)
Potassium: 3.5 mmol/L (ref 3.5–5.1)
Sodium: 137 mmol/L (ref 135–145)
Total Bilirubin: 0.5 mg/dL (ref <1.2)
Total Protein: 8.1 g/dL (ref 6.5–8.1)

## 2023-08-29 LAB — CBC
HCT: 41.1 % (ref 36.0–46.0)
Hemoglobin: 13.6 g/dL (ref 12.0–15.0)
MCH: 28.1 pg (ref 26.0–34.0)
MCHC: 33.1 g/dL (ref 30.0–36.0)
MCV: 84.9 fL (ref 80.0–100.0)
Platelets: 350 10*3/uL (ref 150–400)
RBC: 4.84 MIL/uL (ref 3.87–5.11)
RDW: 13.7 % (ref 11.5–15.5)
WBC: 18.2 10*3/uL — ABNORMAL HIGH (ref 4.0–10.5)
nRBC: 0 % (ref 0.0–0.2)

## 2023-08-29 MED ORDER — ACETAMINOPHEN 500 MG PO TABS
1000.0000 mg | ORAL_TABLET | Freq: Once | ORAL | Status: AC
  Administered 2023-08-29: 1000 mg via ORAL
  Filled 2023-08-29: qty 2

## 2023-08-29 MED ORDER — TETANUS-DIPHTH-ACELL PERTUSSIS 5-2.5-18.5 LF-MCG/0.5 IM SUSY
0.5000 mL | PREFILLED_SYRINGE | Freq: Once | INTRAMUSCULAR | Status: AC
  Administered 2023-08-29: 0.5 mL via INTRAMUSCULAR
  Filled 2023-08-29: qty 0.5

## 2023-08-29 NOTE — ED Notes (Signed)
Ice pack applied to head, this RN checked for pt family no answer at this time.

## 2023-08-29 NOTE — ED Notes (Signed)
Pt back from CT, PD at bedside.

## 2023-08-29 NOTE — ED Notes (Signed)
Pt at CT

## 2023-08-29 NOTE — ED Notes (Signed)
This RN cleaned blood off pt face and attempted to clean off pt hair. Pt has a approximately 4 inch laceration to the top right side of her head. Slight bleeding at this time. Pt has leafs in her hair. Pt assisted to use bed pan, slight bruising noted to pt Right knee.
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
## 2023-08-30 MED ORDER — OXYCODONE HCL 5 MG PO TABS
10.0000 mg | ORAL_TABLET | ORAL | Status: AC
  Administered 2023-08-30: 10 mg via ORAL
  Filled 2023-08-30: qty 2
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
## 2023-08-30 NOTE — ED Notes (Signed)
Pt pumping at this time.

## 2024-02-11 ENCOUNTER — Encounter: Payer: Self-pay | Admitting: Advanced Practice Midwife

## 2024-02-11 ENCOUNTER — Ambulatory Visit: Admitting: Advanced Practice Midwife

## 2024-02-11 VITALS — BP 121/76 | HR 86 | Ht 61.0 in | Wt 140.0 lb

## 2024-02-11 DIAGNOSIS — K649 Unspecified hemorrhoids: Secondary | ICD-10-CM

## 2024-02-11 MED ORDER — HYDROCORTISONE (PERIANAL) 2.5 % EX CREA: 1.0000 | TOPICAL_CREAM | Freq: Two times a day (BID) | CUTANEOUS | 0 refills | Status: AC

## 2024-02-11 NOTE — Patient Instructions (Signed)
 Hemorrhoids Hemorrhoids are swollen veins in and around the rectum or the opening of the butt (anus). There are two types of hemorrhoids: Internal. These occur in the veins just inside the rectum. They may poke through to the outside and become irritated and painful. External. These occur in the veins outside the anus. They can be felt as a painful swelling or hard lump near the anus. Most hemorrhoids do not cause severe problems. Often, they can be treated at home with diet and lifestyle changes. If home treatments do not help, you may need a procedure to shrink or remove the hemorrhoids. What are the causes? Hemorrhoids are caused by pressure near the anus. This pressure may be caused by: Constipation or diarrhea. Straining to poop. Pregnancy. Obesity. Sitting or riding a bike for a long time. Heavy lifting or other things that cause you to strain. Anal sex. What are the signs or symptoms? Symptoms of this condition include: Pain. Anal itching or irritation. Bleeding from the rectum. Leakage of poop (stool). Swelling of the anus. One or more lumps around the anus. How is this diagnosed? Hemorrhoids can often be diagnosed through a visual exam. Other exams or tests may also be done, such as: A digital rectal exam. This is when your health care provider feels inside your rectum with a gloved finger. Anoscope. This is an exam of the anus using a small tube. A blood test, if you have lost a lot of blood. A sigmoidoscopy or colonoscopy. These are tests to look inside the colon using a tube with a camera on the end. How is this treated? In most cases, hemorrhoids can be treated at home with diet and lifestyle changes. If these changes do not help, you may need to have a procedure done. These procedures can make the hemorrhoids smaller or fully remove them. Common procedures include: Rubber band ligation. Rubber bands are placed at the base of the hemorrhoids to cut off their blood  supply. Sclerotherapy. Medicine is put into the hemorrhoids to shrink them. Infrared coagulation. A type of light energy is used to get rid of the hemorrhoids. Hemorrhoidectomy surgery. The hemorrhoids are removed during surgery. Then, the veins that supply them are tied off. Stapled hemorrhoidopexy surgery. The base of the hemorrhoid is stapled to the wall of the rectum. Follow these instructions at home: Medicines Take over-the-counter and prescription medicines only as told by your provider. Use medicated creams or medicines that are put in the rectum (suppositories) as told by your provider. Eating and drinking  Eat foods that are high in fiber, such as beans, whole grains, and fresh fruits and vegetables. Ask your provider about taking products that have fiber added to them (fiber supplements). Reduce the amount of fat in your diet. You can do this by eating low-fat dairy products, eating less red meat, and avoiding processed foods. Drink enough fluid to keep your pee (urine) pale yellow. Managing pain and swelling  Take warm sitz baths for 20 minutes, 3-4 times a day. This can help ease pain and discomfort. You may do this in a bathtub or you can use a portable sitz bath that fits over the toilet. If told, put ice on the affected area. It may help to use ice packs between sitz baths. Put ice in a plastic bag. Place a towel between your skin and the bag. Leave the ice on for 20 minutes, 2-3 times a day. If your skin turns bright red, remove the ice right away to prevent  skin damage. The risk of damage is higher if you cannot feel pain, heat, or cold. General instructions Exercise. Ask your provider how much and what kind of exercise is best for you. In general, you should do moderate exercise for at least 30 minutes on most days of the week (150 minutes each week). You may want to try walking, biking, or yoga. Go to the bathroom when you have the urge to poop. Do not wait. Avoid  straining to poop. Keep the anus dry and clean. Use wet toilet paper or moist towelettes after you poop. Do not sit on the toilet for a long time. This can increase blood pooling and pain. Where to find more information [employer] of Diabetes and Digestive and Kidney Diseases: [employer] Contact a health care provider if: You have more pain and swelling that do not get better with treatment. You have trouble pooping or you are not able to poop. You have pain or inflammation outside the area of the hemorrhoids. Get help right away if: You are bleeding from your rectum and you cannot get it to stop. This information is not intended to replace advice given to you by your health care provider. Make sure you discuss any questions you have with your health care provider. Document Revised: 06/17/2022 Document Reviewed: 06/17/2022 Elsevier Patient Education  2024 [employer].

## 2024-02-11 NOTE — Progress Notes (Signed)
 Patient ID: Jessica Mcintosh, female   DOB: 07/21/1991, 33 y.o.   MRN: [id number]  Reason for Consult: Hemorrhoids   Subjective:  HPI:  Jessica Mcintosh is a 33 y.o. female being seen for concern of hemorrhoids. She first noticed the pain and swelling at the anus a couple of months ago. She is 8 months postpartum. Normally has a bowel movement every couple of days and in the past month it is weekly. She does admit straining with occasional bleeding associated. Has tried Preparation H with some relief. Has not used Tucks pads. Recommend increase H2O, fiber, use OTC medications as needed for constipation. Will Rx Anusol . Reviewed etiology, management and treatment of hemorrhoidal tissue.   Past Medical History:  Diagnosis Date   [redacted] weeks gestation of pregnancy 03/17/2023   Acute focal neurological deficit 06/06/2021   ADD (attention deficit disorder) 05/28/2020   Allergy    Body mass index (BMI) of 31.0-31.9 in adult 03/20/2020   Fatigue 03/20/2020   High risk medication use 06/17/2021   MS (multiple sclerosis) (HCC)    Numbness and tingling of right lower extremity    Ovarian cyst    Preeclampsia 2018   Screening for blood or protein in urine 03/20/2020   Tenderness of female pelvic organs 03/20/2020   Vitamin D deficiency 03/20/2020   Weight gain 03/20/2020   Family History  Problem Relation Age of Onset   Depression Mother    Hypertension Mother    COPD Mother    Alcoholism Father    Hypertension Father    Breast cancer Maternal Grandmother    Depression Maternal Grandmother    Alzheimer's disease Maternal Grandmother    Lung cancer Maternal Grandfather    Breast cancer Paternal Grandmother    Hypertension Paternal Grandmother    Glaucoma Paternal Grandmother    Dementia Paternal Grandmother    Skin cancer Paternal Grandfather    Parkinson's disease Paternal Grandfather    Depression Maternal Aunt    Hypertension Maternal Aunt    Lung cancer Paternal Uncle    Ovarian  cancer Neg Hx    Colon cancer Neg Hx    Past Surgical History:  Procedure Laterality Date   WISDOM TOOTH EXTRACTION     [age];  teens    Short Social History:  Social History   Tobacco Use   Smoking status: Never   Smokeless tobacco: Never  Substance Use Topics   Alcohol use: No    Allergies  Allergen Reactions   Aspirin Hives, Itching, Rash, Shortness Of Breath and Swelling   Penicillins Hives and Rash    Current Outpatient Medications  Medication Sig Dispense Refill   ADDERALL XR 30 MG 24 hr capsule Take 30 mg by mouth every morning.     hydrocortisone  (ANUSOL -HC) 2.5 % rectal cream Place 1 Application rectally 2 (two) times daily. 30 g 0   norethindrone  (ORTHO MICRONOR ) 0.35 MG tablet Take 1 tablet (0.35 mg total) by mouth daily. 84 tablet 4   No current facility-administered medications for this visit.    Review of Systems  Constitutional:  Negative for chills and fever.  HENT:  Negative for congestion, ear discharge, ear pain, hearing loss, sinus pain and sore throat.   Eyes:  Negative for blurred vision and double vision.  Respiratory:  Negative for cough, shortness of breath and wheezing.   Cardiovascular:  Negative for chest pain, palpitations and leg swelling.  Gastrointestinal:  Positive for constipation. Negative for abdominal pain, blood in stool, diarrhea, heartburn, melena,  nausea and vomiting.       Positive for hemorrhoids  Genitourinary:  Negative for dysuria, flank pain, frequency, hematuria and urgency.  Musculoskeletal:  Negative for back pain, joint pain and myalgias.  Skin:  Negative for itching and rash.  Neurological:  Negative for dizziness, tingling, tremors, sensory change, speech change, focal weakness, seizures, loss of consciousness, weakness and headaches.  Endo/Heme/Allergies:  Negative for environmental allergies. Does not bruise/bleed easily.  Psychiatric/Behavioral:  Negative for depression, hallucinations, memory loss, substance abuse  and suicidal ideas. The patient is not nervous/anxious and does not have insomnia.         Objective:  Objective   Vitals:   02/11/24 1143  BP: 121/76  Pulse: 86  Weight: 140 lb (63.5 kg)  Height: 5\' 1"  (1.549 m)   Body mass index is 26.45 kg/m. Constitutional: Well nourished, well developed female in no acute distress.  HEENT: normal Skin: Warm and dry.  Cardiovascular: Regular rate and rhythm.   Extremity: no edema Respiratory:  Normal respiratory effort Psych: Alert and Oriented x3. No memory deficits. Normal mood and affect.    Pelvic exam:  is not limited by body habitus EGBUS: within normal limits Anus: several inflamed areas of external hemorrhoidal tissue, not bleeding   Assessment/Plan:     33 y.o. G2 P90 female with inflamed hemorrhoids  Rx Anusol  Use OTC Tucks pads OTC bowel meds to treat constipation; miralax  Increase hydration and fiber, avoid straining   [name], CNM [facility] 02/11/2024 12:15 PM
